# Patient Record
Sex: Female | Born: 1980 | Race: White | Hispanic: No | Marital: Single | Smoking: Never smoker
Health system: Southern US, Community
[De-identification: ages and names within clinical notes are randomized; demographics above are authoritative.]

## PROBLEM LIST (undated history)

## (undated) DIAGNOSIS — I10 Essential (primary) hypertension: Secondary | ICD-10-CM

## (undated) DIAGNOSIS — E669 Obesity, unspecified: Secondary | ICD-10-CM

## (undated) DIAGNOSIS — G4730 Sleep apnea, unspecified: Secondary | ICD-10-CM

## (undated) DIAGNOSIS — B009 Herpesviral infection, unspecified: Secondary | ICD-10-CM

## (undated) DIAGNOSIS — M1990 Unspecified osteoarthritis, unspecified site: Secondary | ICD-10-CM

## (undated) DIAGNOSIS — F419 Anxiety disorder, unspecified: Secondary | ICD-10-CM

## (undated) DIAGNOSIS — J45909 Unspecified asthma, uncomplicated: Secondary | ICD-10-CM

## (undated) DIAGNOSIS — T7840XA Allergy, unspecified, initial encounter: Secondary | ICD-10-CM

## (undated) DIAGNOSIS — R000 Tachycardia, unspecified: Secondary | ICD-10-CM

## (undated) DIAGNOSIS — K219 Gastro-esophageal reflux disease without esophagitis: Secondary | ICD-10-CM

## (undated) DIAGNOSIS — D689 Coagulation defect, unspecified: Secondary | ICD-10-CM

## (undated) DIAGNOSIS — C801 Malignant (primary) neoplasm, unspecified: Secondary | ICD-10-CM

## (undated) DIAGNOSIS — N83209 Unspecified ovarian cyst, unspecified side: Secondary | ICD-10-CM

## (undated) DIAGNOSIS — D649 Anemia, unspecified: Secondary | ICD-10-CM

## (undated) HISTORY — DX: Sleep apnea, unspecified: G47.30

## (undated) HISTORY — PX: SALPINGOOPHORECTOMY: SHX82

## (undated) HISTORY — DX: Essential (primary) hypertension: I10

## (undated) HISTORY — DX: Coagulation defect, unspecified: D68.9

## (undated) HISTORY — DX: Unspecified osteoarthritis, unspecified site: M19.90

## (undated) HISTORY — DX: Anxiety disorder, unspecified: F41.9

## (undated) HISTORY — DX: Gastro-esophageal reflux disease without esophagitis: K21.9

## (undated) HISTORY — DX: Obesity, unspecified: E66.9

## (undated) HISTORY — DX: Unspecified ovarian cyst, unspecified side: N83.209

## (undated) HISTORY — DX: Unspecified asthma, uncomplicated: J45.909

## (undated) HISTORY — DX: Allergy, unspecified, initial encounter: T78.40XA

## (undated) HISTORY — DX: Herpesviral infection, unspecified: B00.9

## (undated) HISTORY — DX: Tachycardia, unspecified: R00.0

## (undated) HISTORY — DX: Malignant (primary) neoplasm, unspecified: C80.1

## (undated) HISTORY — DX: Anemia, unspecified: D64.9

---

## 2003-05-01 DIAGNOSIS — C801 Malignant (primary) neoplasm, unspecified: Secondary | ICD-10-CM

## 2003-05-01 HISTORY — PX: OMENTECTOMY: SHX2098

## 2003-05-01 HISTORY — PX: OTHER SURGICAL HISTORY: SHX169

## 2003-05-01 HISTORY — DX: Malignant (primary) neoplasm, unspecified: C80.1

## 2008-09-29 ENCOUNTER — Emergency Department (HOSPITAL_BASED_OUTPATIENT_CLINIC_OR_DEPARTMENT_OTHER): Admission: EM | Admit: 2008-09-29 | Discharge: 2008-09-29 | Payer: Self-pay | Admitting: Emergency Medicine

## 2008-09-29 ENCOUNTER — Ambulatory Visit: Payer: Self-pay | Admitting: Diagnostic Radiology

## 2008-12-07 ENCOUNTER — Emergency Department (HOSPITAL_BASED_OUTPATIENT_CLINIC_OR_DEPARTMENT_OTHER): Admission: EM | Admit: 2008-12-07 | Discharge: 2008-12-07 | Payer: Self-pay | Admitting: Emergency Medicine

## 2008-12-26 ENCOUNTER — Ambulatory Visit: Payer: Self-pay | Admitting: Diagnostic Radiology

## 2008-12-26 ENCOUNTER — Emergency Department (HOSPITAL_BASED_OUTPATIENT_CLINIC_OR_DEPARTMENT_OTHER): Admission: EM | Admit: 2008-12-26 | Discharge: 2008-12-26 | Payer: Self-pay | Admitting: Emergency Medicine

## 2009-11-07 ENCOUNTER — Ambulatory Visit: Payer: Self-pay | Admitting: Family Medicine

## 2010-01-09 ENCOUNTER — Ambulatory Visit: Payer: Self-pay | Admitting: Family Medicine

## 2010-01-09 DIAGNOSIS — I10 Essential (primary) hypertension: Secondary | ICD-10-CM

## 2010-01-09 DIAGNOSIS — K644 Residual hemorrhoidal skin tags: Secondary | ICD-10-CM

## 2010-01-09 DIAGNOSIS — J309 Allergic rhinitis, unspecified: Secondary | ICD-10-CM

## 2010-01-09 DIAGNOSIS — N760 Acute vaginitis: Secondary | ICD-10-CM | POA: Insufficient documentation

## 2010-01-09 HISTORY — DX: Essential (primary) hypertension: I10

## 2010-01-09 HISTORY — DX: Residual hemorrhoidal skin tags: K64.4

## 2010-01-09 HISTORY — DX: Allergic rhinitis, unspecified: J30.9

## 2010-01-10 ENCOUNTER — Encounter: Payer: Self-pay | Admitting: Family Medicine

## 2010-01-10 LAB — CONVERTED CEMR LAB
Clue Cells Wet Prep HPF POC: NONE SEEN
Trich, Wet Prep: NONE SEEN
Yeast Wet Prep HPF POC: NONE SEEN

## 2010-02-08 ENCOUNTER — Ambulatory Visit: Payer: Self-pay | Admitting: Family Medicine

## 2010-02-09 LAB — CONVERTED CEMR LAB
AST: 20 units/L (ref 0–37)
Albumin: 4 g/dL (ref 3.5–5.2)
Alkaline Phosphatase: 111 units/L (ref 39–117)
BUN: 16 mg/dL (ref 6–23)
Glucose, Bld: 106 mg/dL — ABNORMAL HIGH (ref 70–99)
HDL: 48 mg/dL (ref >39)
LDL Cholesterol: 81 mg/dL (ref 0–99)
Potassium: 3.9 meq/L (ref 3.5–5.3)
Sodium: 137 meq/L (ref 135–145)
Total Bilirubin: 0.4 mg/dL (ref 0.3–1.2)
Triglycerides: 67 mg/dL (ref <150)
VLDL: 13 mg/dL (ref 0–40)

## 2010-02-10 ENCOUNTER — Encounter: Payer: Self-pay | Admitting: Family Medicine

## 2010-03-06 ENCOUNTER — Telehealth: Payer: Self-pay | Admitting: Family Medicine

## 2010-04-03 ENCOUNTER — Ambulatory Visit: Payer: Self-pay | Admitting: Family Medicine

## 2010-04-03 DIAGNOSIS — B86 Scabies: Secondary | ICD-10-CM | POA: Insufficient documentation

## 2010-04-03 DIAGNOSIS — J019 Acute sinusitis, unspecified: Secondary | ICD-10-CM | POA: Insufficient documentation

## 2010-04-03 DIAGNOSIS — B359 Dermatophytosis, unspecified: Secondary | ICD-10-CM | POA: Insufficient documentation

## 2010-04-07 ENCOUNTER — Telehealth: Payer: Self-pay | Admitting: Family Medicine

## 2010-04-17 ENCOUNTER — Telehealth: Payer: Self-pay | Admitting: Family Medicine

## 2010-05-30 NOTE — Progress Notes (Signed)
  Phone Note Call from Patient   Caller: Patient Call For: Nani Gasser MD Summary of Call: pt called and requested a 90 day supply of benazapril and carvediol sent to [facility] Initial call taken by: Avon Gully CMA, Duncan Dull),  March 06, 2010 11:57 AM    Prescriptions: BENAZEPRIL HCL 20 MG TABS (BENAZEPRIL HCL) Take 1 tablet by mouth once a day  #90 x 0   Entered by:   Avon Gully CMA, (AAMA)   Authorized by:   Nani Gasser MD   Signed by:   Avon Gully CMA, (AAMA) on 03/06/2010   Method used:   [facility]        [facility] [phone]* (retail)       [address] [place], [place]  [address]       Ph: [phone] or [phone]       Fax: [phone]   RxID:   [id number] CARVEDILOL 3.125 MG TABS (CARVEDILOL) bid  #90 x 0   Entered by:   Avon Gully CMA, (AAMA)   Authorized by:   Nani Gasser MD   Signed by:   Avon Gully CMA, (AAMA) on 03/06/2010   Method used:   [facility]        [facility] [address] (retail)       [address] [place], [place]  [address]       Ph: [phone] or [phone]       Fax: [phone]   RxID:   [id number]

## 2010-05-30 NOTE — Assessment & Plan Note (Signed)
Summary: EYE PAIN/CHEST PAIN/STOMACH PAIN   Vital Signs:  Patient Profile:   30 Years Old Female CC:      cold & URI symptoms X 1 week, body aches Height:     63 inches Weight:      253 pounds O2 Sat:      98 % O2 treatment:    Room Air Temp:     97.69 degrees F oral Pulse rate:   92 / minute Resp:     18 per minute BP sitting:   105 / 104  (right arm) Cuff size:   large  Pt. in pain?   yes    Location:   body aches  Vitals Entered By: Lajean Saver RN (November 07, 2009 4:02 PM)                   Updated Prior Medication List: ZYRTEC ALLERGY 10 MG TABS (CETIRIZINE HCL) once daily CARVEDILOL 3.125 MG TABS (CARVEDILOL) bid REOCYTE PLUS 106-1 MG CAPS (B COMPLEX-C-MIN-FE-FA) once daily * BENEZRPRIL 20MG  once daily  Current Allergies: No known allergies History of Present Illness Chief Complaint: cold & URI symptoms X 1 week, body aches History of Present Illness:  Subjective: Patient developed URI symptoms one week ago with sore throat and nasal congestion.  She complains of persistent sore throat and now has earache.  She also has a history of perennial rhinitis.  She also requests Rx for hemorrhoid cream; she has a history of hemorrhoidectomy and recently has had rectal irritation.  She states that she may get yeast infection with antibiotic + cough developed 2 days ago. No pleuritic pain No wheezing + post-nasal drainage ? sinus pain/pressure No itchy/red eyes No hemoptysis No SOB No fever, + chills No nausea No vomiting No abdominal pain No diarrhea No skin rashes +fatigue No myalgias No headache Used OTC meds without relief   REVIEW OF SYSTEMS Constitutional Symptoms       Complains of fatigue.     Denies fever, chills, night sweats, weight loss, and weight gain.  Eyes       Complains of eye pain.      Denies change in vision, eye discharge, glasses, contact lenses, and eye surgery. Ear/Nose/Throat/Mouth       Complains of ear pain, sore throat, and  hoarseness.      Denies hearing loss/aids, change in hearing, ear discharge, dizziness, frequent runny nose, frequent nose bleeds, sinus problems, and tooth pain or bleeding.  Respiratory       Complains of dry cough.      Denies productive cough, wheezing, shortness of breath, asthma, bronchitis, and emphysema/COPD.  Cardiovascular       Complains of chest pain.      Denies murmurs and tires easily with exhertion.      Comments: sore   Gastrointestinal       Complains of stomach pain.      Denies nausea/vomiting, diarrhea, constipation, blood in bowel movements, and indigestion.      Comments: sore, hemroids Genitourniary       Denies painful urination, kidney stones, and loss of urinary control. Neurological       Complains of headaches.      Denies paralysis, seizures, and fainting/blackouts. Musculoskeletal       Denies muscle pain, joint pain, joint stiffness, decreased range of motion, redness, swelling, muscle weakness, and gout.  Skin       Denies bruising, unusual mles/lumps or sores, and hair/skin or nail changes.  Psych       Denies mood changes, temper/anger issues, anxiety/stress, speech problems, depression, and sleep problems. Other Comments: symptoms X 1 week, patient has not taken BP medication today   Past History:  Past Medical History: ca 2005  Past Surgical History: 2005- 1ovary, bilater fallopian tubes removed  Family History: Family History Diabetes 1st degree relative Family History of Cardiovascular disorder  Social History: Occupation: Librarian, academic Single Never Smoked Alcohol use-yes, occasionally Drug use-no Smoking Status:  never Drug Use:  no   Objective:  No acute distress  Eyes:  Pupils are equal, round, and reactive to light and accomdation.  Extraocular movement is intact.  Conjunctivae are not inflamed.  Ears:  Canals normal.  Right tympanic membrane normal, left tympanic membrane red Nose:  Congested turbinates.  Maxillary sinus tenderness  present Pharynx:  Mildly erythematous Neck:  Supple.  Slightly tender shotty anterior/posterior nodes are palpated bilaterally.  Lungs:  Clear to auscultation.  Breath sounds are equal.  Heart:  Regular rate and rhythm without murmurs, rubs, or gallops.  Abdomen:  Nontender without masses or hepatosplenomegaly.  Bowel sounds are present.  No CVA or flank tenderness.  Assessment New Problems: OTITIS MEDIA, SUPPURATIVE, ACUTE, LEFT (ICD-382.00) URI (ICD-465.9) FAMILY HISTORY DIABETES 1ST DEGREE RELATIVE (ICD-V18.0)   Plan New Medications/Changes: TERAZOL 7 0.4 % CREA (TERCONAZOLE) 1 applicatorful PV at bedtime for one week  #1 tube x 0, 11/07/2009, Donna Christen MD ANUSOL-HC 25 MG SUPP (HYDROCORTISONE ACETATE) One pr three times a day prin  #12 x 1, 11/07/2009, Donna Christen MD BENZONATATE 200 MG CAPS (BENZONATATE) One by mouth hs as needed cough  #12 x 0, 11/07/2009, Donna Christen MD AMOXICILLIN 875 MG TABS (AMOXICILLIN) One by mouth two times a day  #20 x 0, 11/07/2009, Donna Christen MD  New Orders: New Patient Level III [04540] Rapid Strep [98119] Planning Comments:   Begin amoxicillin for 10 days.  Begin expectorant and topical decongestant.  Cough suppressant at bedtime.  Stop Zyrtec for now. Rx for Anusol suppositories.  Begin Terazol vaginal cream. Follow-up with PCP if not improving.   The patient and/or caregiver has been counseled thoroughly with regard to medications prescribed including dosage, schedule, interactions, rationale for use, and possible side effects and they verbalize understanding.  Diagnoses and expected course of recovery discussed and will return if not improved as expected or if the condition worsens. Patient and/or caregiver verbalized understanding.  Prescriptions: TERAZOL 7 0.4 % CREA (TERCONAZOLE) 1 applicatorful PV at bedtime for one week  #1 tube x 0   Entered and Authorized by:   Donna Christen MD   Signed by:   Donna Christen MD on 11/07/2009    Method used:   Print then Give to Patient   RxID:   [id number] ANUSOL-HC 25 MG SUPP (HYDROCORTISONE ACETATE) One pr three times a day prin  #12 x 1   Entered and Authorized by:   Donna Christen MD   Signed by:   Donna Christen MD on 11/07/2009   Method used:   Print then Give to Patient   RxID:   [id number] BENZONATATE 200 MG CAPS (BENZONATATE) One by mouth hs as needed cough  #12 x 0   Entered and Authorized by:   Donna Christen MD   Signed by:   Donna Christen MD on 11/07/2009   Method used:   Print then Give to Patient   RxID:   [id number] AMOXICILLIN 875 MG TABS (AMOXICILLIN) One by mouth two times a day  #20  x 0   Entered and Authorized by:   Donna Christen MD   Signed by:   Donna Christen MD on 11/07/2009   Method used:   Print then Give to Patient   RxID:   [id number]   Patient Instructions: 1)  May use Mucinex (guaifenesin) twice daily for congestion. 2)  Increase fluid intake, rest. 3)  May use Afrin nasal spray (or generic oxymetazoline) twice daily for about 5 days.  Also recommend using saline nasal spray several times daily and/or saline nasal irrigation. 4)  Followup with family doctor if not improving 7 to 10 days  Orders Added: 1)  New Patient Level III [65784] 2)  Rapid Strep [69629]  Appended Document: EYE PAIN/CHEST PAIN/STOMACH PAIN Rapid strep test negative

## 2010-05-30 NOTE — Letter (Signed)
Summary: Out of Work  [facility]  [address]   [place], [place] [address]   Phone: [phone]  Fax: [phone]    November 07, 2009   Employee:  Bellatrix Viloria    To Whom It May Concern:   For Medical reasons, please excuse the above named employee from work today.    If you need additional information, please feel free to contact our office.         Sincerely,    Donna Christen MD

## 2010-05-30 NOTE — Letter (Signed)
Summary: Generic Letter  [facility]  [address], [address]   [place], [place] [address]   Phone: [phone]  Fax: [phone]    02/10/2010  Julliana Cheaney [address] [place], [place]  [address]  Dear Ms. Henneke,  We have received your lab results back. Your cholesterol looks great!  Your sugar is elevated at 106 which is in the pre- diabetic range, work on decreasing your sugar and carbohydrate intake and get regular exercise and we will recheck a fasting sugar in 6 months.         Sincerely,   Nani Gasser, MD

## 2010-05-30 NOTE — Assessment & Plan Note (Signed)
Summary: Sinsuitis, scabies   Vital Signs:  Patient profile:   30 year old female Height:      63 inches Weight:      245 pounds Temp:     97.4 degrees F oral Pulse rate:   76 / minute BP sitting:   149 / 90  (right arm) Cuff size:   large  Vitals Entered By: Avon Gully CMA, Duncan Dull) (April 03, 2010 10:49 AM) CC: cough,congestion,HA,sore throat,coughing up mucus since Nov 18   CC:  cough, congestion, HA, sore throat, and coughing up mucus since Nov 18.  History of Present Illness: cough,congestion,HA,sore throat,coughing up mucus since Nov 18.  Sx for 3 weeks. Started Tyelnol coldnad sinus x 5 days. Then felt a little better but never got better. Started to get worse over the last week. Zyrtec still on it. No fever.  Chest hurts. Coughing, sneezing, HA, Ear pain.  Nasal congestion is dark green.  Bumps on her arms showed up around the same time. It is itchy.  Seems worse at night. Her dog sleeps in her bed so she was worried it may be something fromher dog. No creams or ointments.   Current Medications (verified): 1)  Zyrtec Allergy 10 Mg Tabs (Cetirizine Hcl) .... Once Daily 2)  Carvedilol 6.25 Mg Tabs (Carvedilol) .... Take 1 Tablet By Mouth Two Times A Day 3)  Benazepril Hcl 20 Mg Tabs (Benazepril Hcl) .... Take 1 Tablet By Mouth Once A Day  Allergies (verified): No Known Drug Allergies  Comments:  Nurse/Medical Assistant: The patient's medications and allergies were reviewed with the patient and were updated in the Medication and Allergy Lists. Avon Gully CMA, Duncan Dull) (April 03, 2010 10:50 AM)  Physical Exam  General:  Well-developed,well-nourished,in no acute distress; alert,appropriate and cooperative throughout examination Head:  Normocephalic and atraumatic without obvious abnormalities. No apparent alopecia or balding. Eyes:  No corneal or conjunctival inflammation noted. EOMI. Perrla. Ears:  External ear exam shows no significant lesions or  deformities.  Otoscopic examination reveals clear canals, tympanic membranes are intact bilaterally without bulging, retraction, inflammation or discharge. Hearing is grossly normal bilaterally. Nose:  External nasal examination shows no deformity or inflammation. Nasal mucosa are pink and moist without lesions or exudates. Mouth:  Oral mucosa and oropharynx without lesions or exudates.  Teeth in good repair. Neck:  No deformities, masses, or tenderness noted. Lungs:  Normal respiratory effort, chest expands symmetrically. Lungs are clear to auscultation, no crackles or wheezes. Heart:  Normal rate and regular rhythm. S1 and S2 normal without gallop, murmur, click, rub or other extra sounds. Msk:  She has small erythematous dry papules  few on her anterior wrist , right axillar, right foot and a few on the crease on her abdomen. Not linear pattern.  also on the lateral right foot has a circular erthematous lesion that has a raised border and some central clearing. Also some very dry callous skin on her toes.  Skin:  no rashes.   Cervical Nodes:  No lymphadenopathy noted Psych:  Cognition and judgment appear intact. Alert and cooperative with normal attention span and concentration. No apparent delusions, illusions, hallucinations   Impression & Recommendations:  Problem # 1:  SINUSITIS - ACUTE-NOS (ICD-461.9)  The following medications were removed from the medication list:    Benzonatate 200 Mg Caps (Benzonatate) ..... One by mouth hs as needed cough    Fluticasone Propionate 50 Mcg/act Susp (Fluticasone propionate) .Marland Kitchen... 2 sprays in each nostril once a day.  Her updated medication list for this problem includes:    Amoxicillin 875 Mg Tabs (Amoxicillin) .Marland Kitchen... Take 1 tablet by mouth two times a day for 10 days  Instructed on treatment. Call if symptoms persist or worsen. Contiue allergy tx in addition to abx. Also tx for yeast infection as she usually gets these with ABX tx.   Problem # 2:   SCABIES (ICD-133.0) Discussed possiblity of dx. This isn't from her dog. Explained transmitted from humans.Will treat. Avoid any sexual contact until treated.  Can repeat tx in 14 days.   Problem # 3:  RINGWORM (ICD-110.9) Reviewed tx.   Complete Medication List: 1)  Zyrtec Allergy 10 Mg Tabs (Cetirizine hcl) .... Once daily 2)  Carvedilol 6.25 Mg Tabs (Carvedilol) .... Take 1 tablet by mouth two times a day 3)  Benazepril Hcl 20 Mg Tabs (Benazepril hcl) .... Take 1 tablet by mouth once a day 4)  Permethrin 5 % Crea (Permethrin) .... Apply it to whole body (head to feet) and leave on overnight and then wash it off.  then can repeat in 14 days if needed. 5)  Amoxicillin 875 Mg Tabs (Amoxicillin) .... Take 1 tablet by mouth two times a day for 10 days 6)  Fluconazole 150 Mg Tabs (Fluconazole) .... Take 1 tablet by mouth once a day   Patient Instructions: 1)  Can use the lamisil on the ringworm on your feet two times a day for 6 weeks 2)  Can use the permtrin for scabies and repeat in 14 days if needed. ( if see new lesions).  3)  Complete the antibiotic for your sinusitis.  Prescriptions: FLUCONAZOLE 150 MG TABS (FLUCONAZOLE) Take 1 tablet by mouth once a day  #1 x 0   Entered and Authorized by:   Nani Gasser MD   Signed by:   Nani Gasser MD on 04/03/2010   Method used:   [facility]  [address] [phone][facility]       [address] [place], [place]  [address]       Ph: [phone] or [phone]       Fax: [phone]   RxID:   [id number] AMOXICILLIN 875 MG TABS (AMOXICILLIN) Take 1 tablet by mouth two times a day for 10 days  #20 x 0   Entered and Authorized by:   Nani Gasser MD   Signed by:   Nani Gasser MD on 04/03/2010   Method used:   [facility]        [facility]  [address] [phone]* (retail)       [address] [place], [place]  [address]       Ph: [phone] or [phone]       Fax:  [phone]   RxID:   [id number] PERMETHRIN 5 % CREA (PERMETHRIN) Apply it to whole body (head to feet) and leave on overnight and then wash it off.  Then can repeat in 14 days if needed.  #1 bottle x 0   Entered and Authorized by:   Nani Gasser MD   Signed by:   Nani Gasser MD on 04/03/2010   Method used:   [facility]        [facility]  [address] [phone]* (retail)       [address]  [place], [place]  [address]       Ph: [phone] or [phone]       Fax: [phone]   RxID:   [id number]    Orders Added: 1)  Est. Patient Level IV [74259]

## 2010-05-30 NOTE — Progress Notes (Signed)
Summary: scabies  Phone Note Call from Patient   Caller: Patient Call For: Nani Gasser MD Summary of Call: pt called and states she has more red lesions on her body from scabies.called and left message on pt vm mail to repeat med for scabies in 14 days and in the meantime we can call in triamcinalone cream to help with itching and redness. Initial call taken by: Avon Gully CMA, Duncan Dull),  April 07, 2010 9:12 AM  Follow-up for Phone Call        Rx Called In Follow-up by: Nani Gasser MD,  April 07, 2010 9:28 AM    New/Updated Medications: TRIAMCINOLONE ACETONIDE 0.5 % OINT (TRIAMCINOLONE ACETONIDE) Apply two times a day to affected areas. Prescriptions: TRIAMCINOLONE ACETONIDE 0.5 % OINT (TRIAMCINOLONE ACETONIDE) Apply two times a day to affected areas.  #20 grams. x 0   Entered and Authorized by:   Nani Gasser MD   Signed by:   Nani Gasser MD on 04/07/2010   Method used:   [facility]        [facility]  [address] [phone]* (retail)       [address]       [place], [place]  [address]       Ph: [phone] or [phone]       Fax: [phone]   RxID:   [id number]

## 2010-05-30 NOTE — Assessment & Plan Note (Signed)
Summary: NOV: HTN, vaginitis, hemorrhoids, etc   Vital Signs:  Patient profile:   30 year old female Height:      63 inches Weight:      249 pounds BMI:     44.27 Pulse rate:   80 / minute BP sitting:   143 / 104  (right arm) Cuff size:   large  Vitals Entered By: Avon Gully CMA, Duncan Dull) (January 09, 2010 2:03 PM) CC: NP est care High blood pressure, Hypertension Management   CC:  NP est care High blood pressure and Hypertension Management.  History of Present Illness: HTN- Dx a couple of years ago and treated with carvedolol. Has a cardiologist in [facility], but wants to find a family MD.   Does have sleep apnea, but is not treated. Can't use the machine.  Has excessive daytime fatigue. 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  Last sleep study 2 years ago. Can't watch an entire movie without falling asleep.  Tolerated BP meds well. OUt of the benazepril for a couple of weeks.   Was on allergy shots for 5 years. Stopped them about a year ago.  Has had 2 ear infection and one sinus infection in the last 3 months.  Has been on ABX adn has been getting yeast infections.  Thinks she has one right now.  Still having post nasal drip, ST, raspy voice and some ear pressure. No ear pain or fever. She is taking her zyrtec daily.    Hypertension History:      She denies headache, chest pain, palpitations, dyspnea with exertion, orthopnea, PND, peripheral edema, visual symptoms, neurologic problems, syncope, and side effects from treatment.  She notes no problems with any antihypertensive medication side effects.        Positive major cardiovascular risk factors include hypertension.  Negative major cardiovascular risk factors include female age less than 65 years old and non-tobacco-user status.     Habits & Providers  Alcohol-Tobacco-Diet     Alcohol drinks/day: <1     Tobacco Status: never  Exercise-Depression-Behavior     Does Patient Exercise: no     STD Risk: never     Drug Use: never   Seat Belt Use: always  Current Medications (verified): 1)  Zyrtec Allergy 10 Mg Tabs (Cetirizine Hcl) .... Once Daily 2)  Carvedilol 3.125 Mg Tabs (Carvedilol) .... Bid 3)  Reocyte Plus 106-1 Mg Caps (B Complex-C-Min-Fe-Fa) .... Once Daily 4)  Benezrpril 20mg  .... Once Daily 5)  Benzonatate 200 Mg Caps (Benzonatate) .... One By Mouth Hs As Needed Cough 6)  Anusol-Hc 25 Mg Supp (Hydrocortisone Acetate) .... One Pr Three Times A Day Prin 7)  Terazol 7 0.4 % Crea (Terconazole) .Marland Kitchen.. 1 Applicatorful Pv At Bedtime For One Week  Allergies (verified): No Known Drug Allergies  Comments:  Nurse/Medical Assistant: The patient's medications and allergies were reviewed with the patient and were updated in the Medication and Allergy Lists. Avon Gully CMA, Duncan Dull) (January 09, 2010 2:05 PM)  Past History:  Past Medical History: ca 2005 Laser tx for hemorrhoids  Family History: Family History Diabetes 1st degree relative Family History of Cardiovascular disorder Mom wtih HTN  Social History: Does Patient Exercise:  no STD Risk:  never Drug Use:  never Risk analyst Use:  always  Review of Systems       No fever/sweats/weakness, unexplained weight loss/gain.  No vison changes.  No difficulty hearing/ringing in ears, hay fever/allergies.  No chest pain/discomfort, palpitations.  No Br lump/nipple  discharge.  + cough/wheeze.  No blood in BM, nausea/vomiting/diarrhea.  No nighttime urination, leaking urine, unusual vaginal bleeding, discharge (penis or vagina).  No muscle/joint pain. No rash, change in mole.  No HA, memory loss.  No anxiety, sleep d/o, depression.  No easy bruising/bleeding, unexplained lump   Physical Exam  General:  Well-developed,well-nourished,in no acute distress; alert,appropriate and cooperative throughout examination Head:  Normocephalic and atraumatic without obvious abnormalities. No apparent alopecia or balding. Eyes:  No corneal or conjunctival inflammation  noted. EOMI. Perrla.  Ears:  External ear exam shows no significant lesions or deformities.  Otoscopic examination reveals clear canals, tympanic membranes are intact bilaterally without bulging, retraction, inflammation or discharge. Hearing is grossly normal bilaterally. Nose:  External nasal examination shows no deformity or inflammation.  Mouth:  Oral mucosa and oropharynx without lesions or exudates. Narrow OP with mildy enlarged tonsils.  Neck:  No deformities, masses, or tenderness noted. Lungs:  Normal respiratory effort, chest expands symmetrically. Lungs are clear to auscultation, no crackles or wheezes. Heart:  Normal rate and regular rhythm. S1 and S2 normal without gallop, murmur, click, rub or other extra sounds. Skin:  no rashes.   Cervical Nodes:  No lymphadenopathy noted Psych:  Cognition and judgment appear intact. Alert and cooperative with normal attention span and concentration. No apparent delusions, illusions, hallucinations   Impression & Recommendations:  Problem # 1:  HYPERTENSION, MILD (ICD-401.1) Assessment New She is out of her benazepril. Restart and then follow up  in one month to rehcekc pressure. Due for fasting labs as well.  Her updated medication list for this problem includes:    Carvedilol 3.125 Mg Tabs (Carvedilol) ..... Bid    Benazepril Hcl 20 Mg Tabs (Benazepril hcl) .Marland Kitchen... Take 1 tablet by mouth once a day  Orders: T-Comprehensive Metabolic Panel [id number]) T-Lipid Profile ([id number])  BP today: 143/104 Prior BP: 105/104 (11/07/2009)  Problem # 2:  ALLERGIC RHINITIS (ICD-477.9) Assessment: New Add nasal steroid to her regimen. If ST and ear pressure not getting better in one week then call the office or call sooner if gettting worse.  Refer to Allergist to get started on Allergy shots.  Her updated medication list for this problem includes:    Zyrtec Allergy 10 Mg Tabs (Cetirizine hcl) ..... Once daily    Fluticasone Propionate 50  Mcg/act Susp (Fluticasone propionate) .Marland Kitchen... 2 sprays in each nostril once a day.  Orders: Allergy Referral  (Allergy)  Problem # 3:  VAGINITIS (ICD-616.10) Assessment: New  Likely yeast with her recent ABX exposure. Will get wet prep to confirm but will go ahead and send over diflucan.  The following medications were removed from the medication list:    Amoxicillin 875 Mg Tabs (Amoxicillin) ..... One by mouth two times a day Her updated medication list for this problem includes:    Terazol 7 0.4 % Crea (Terconazole) .Marland Kitchen... 1 applicatorful pv at bedtime for one week  Orders: T-Wet Prep for Christoper Allegra, [facility] [phone])  Problem # 4:  EXTERNAL HEMORRHOIDS (ICD-455.3) Assessment: New Trial of anamantle. If not helping then consider laser tx as she had in the past as this did help for almost 2 years.  She had that done by her prior PCP at [facility].   Complete Medication List: 1)  Zyrtec Allergy 10 Mg Tabs (Cetirizine hcl) .... Once daily 2)  Carvedilol 3.125 Mg Tabs (Carvedilol) .... Bid 3)  Reocyte Plus 106-1 Mg Caps (B complex-c-min-fe-fa) .... Once daily 4)  Benazepril Hcl  20 Mg Tabs (Benazepril hcl) .... Take 1 tablet by mouth once a day 5)  Benzonatate 200 Mg Caps (Benzonatate) .... One by mouth hs as needed cough 6)  Anusol-hc 25 Mg Supp (Hydrocortisone acetate) .... One pr three times a day prin 7)  Terazol 7 0.4 % Crea (Terconazole) .Marland Kitchen.. 1 applicatorful pv at bedtime for one week 8)  Fluticasone Propionate 50 Mcg/act Susp (Fluticasone propionate) .... 2 sprays in each nostril once a day. 9)  Fluconazole 150 Mg Tabs (Fluconazole) .... Take 1 tablet by mouth once a day x 1 10)  Lidocaine-hydrocortisone Ace 3-2.5 % Kit (Lidocaine-hydrocortisone ace) .... Apply two times a day for 1 weeks.  Hypertension Assessment/Plan:      The patient's hypertensive risk group is category A: No risk factors and no target organ damage.  Today's blood pressure is 143/104.  Her  blood pressure goal is < 140/90.  Contraindications/Deferment of Procedures/Staging:    Test/Procedure: FLU VAX    Reason for deferment: patient declined   Patient Instructions: 1)  Add flonase to your allergy regimen.   2)  We will call you with the swab results. I did go ahead and send over a rx for diflucan for a yeast infection.  3)  Please schedule a follow-up appointment in 1 month.  Prescriptions: LIDOCAINE-HYDROCORTISONE ACE 3-2.5 % KIT (LIDOCAINE-HYDROCORTISONE ACE) Apply two times a day for 1 weeks.  #1 box x 1   Entered and Authorized by:   Nani Gasser MD   Signed by:   Nani Gasser MD on 01/09/2010   Method used:   Electronically to        [facility]  [address] [phone]* (retail)       [address] [place], [place]  [address]       Ph: [phone] or [phone]       Fax: [phone]   RxID:   [id number] FLUCONAZOLE 150 MG TABS (FLUCONAZOLE) Take 1 tablet by mouth once a day x 1  #1 x 1   Entered and Authorized by:   Nani Gasser MD   Signed by:   Nani Gasser MD on 01/09/2010   Method used:   Electronically to        [facility]  [address] [phone]* (retail)       [address] [place], [place]  [address]       Ph: [phone] or [phone]       Fax: [phone]   RxID:   [id number] FLUTICASONE PROPIONATE 50 MCG/ACT SUSP (FLUTICASONE PROPIONATE) 2 sprays in each nostril once a day.  #1 bottle. x 1   Entered and Authorized by:   Nani Gasser MD   Signed by:   Nani Gasser MD on 01/09/2010   Method used:   Electronically to        [facility]  [address] [phone]* (retail)       [address]       [place], [place]  [address]       Ph: [phone] or [phone]       Fax: [phone]   RxID:   [id number]   Appended Document: NOV: HTN, vaginitis, hemorrhoids, etc

## 2010-05-30 NOTE — Assessment & Plan Note (Signed)
Summary: 1 mo f/u HTN, allergies   Vital Signs:  Patient profile:   30 year old female Height:      63 inches Weight:      250 pounds BMI:     44.45 O2 Sat:      98 % on Room air Pulse rate:   90 / minute BP sitting:   100 / 62  (left arm) Cuff size:   large  Vitals Entered By: Payton Spark CMA (February 08, 2010 8:14 AM)  O2 Flow:  Room air CC: F/U. Doing well. , Hypertension Management   CC:  F/U. Doing well.  and Hypertension Management.  Hypertension History:      She denies headache, chest pain, palpitations, dyspnea with exertion, orthopnea, PND, peripheral edema, visual symptoms, neurologic problems, syncope, and side effects from treatment.  She notes no problems with any antihypertensive medication side effects.        Positive major cardiovascular risk factors include hypertension.  Negative major cardiovascular risk factors include female age less than 45 years old and non-tobacco-user status.     Current Medications (verified): 1)  Zyrtec Allergy 10 Mg Tabs (Cetirizine Hcl) .... Once Daily 2)  Carvedilol 3.125 Mg Tabs (Carvedilol) .... Bid 3)  Reocyte Plus 106-1 Mg Caps (B Complex-C-Min-Fe-Fa) .... Once Daily 4)  Benazepril Hcl 20 Mg Tabs (Benazepril Hcl) .... Take 1 Tablet By Mouth Once A Day 5)  Benzonatate 200 Mg Caps (Benzonatate) .... One By Mouth Hs As Needed Cough 6)  Anusol-Hc 25 Mg Supp (Hydrocortisone Acetate) .... One Pr Three Times A Day Prin 7)  Terazol 7 0.4 % Crea (Terconazole) .Marland Kitchen.. 1 Applicatorful Pv At Bedtime For One Week 8)  Fluticasone Propionate 50 Mcg/act Susp (Fluticasone Propionate) .... 2 Sprays in Each Nostril Once A Day. 9)  Fluconazole 150 Mg Tabs (Fluconazole) .... Take 1 Tablet By Mouth Once A Day X 1  Allergies (verified): No Known Drug Allergies  Physical Exam  General:  Well-developed,well-nourished,in no acute distress; alert,appropriate and cooperative throughout examination Head:  Normocephalic and atraumatic without obvious  abnormalities. No apparent alopecia or balding. Eyes:  No corneal or conjunctival inflammation noted. EOMI. Perrla. Ears:  External ear exam shows no significant lesions or deformities.  Otoscopic examination reveals clear canals, tympanic membranes are intact bilaterally without bulging, retraction, inflammation or discharge. Hearing is grossly normal bilaterally. Nose:  External nasal examination shows no deformity or inflammation. Nasal mucosa are pink and moist without lesions or exudates. Lungs:  Normal respiratory effort, chest expands symmetrically. Lungs are clear to auscultation, no crackles or wheezes. Heart:  Normal rate and regular rhythm. S1 and S2 normal without gallop, murmur, click, rub or other extra sounds. Skin:  no rashes.   Cervical Nodes:  No lymphadenopathy noted Psych:  Cognition and judgment appear intact. Alert and cooperative with normal attention span and concentration. No apparent delusions, illusions, hallucinations   Impression & Recommendations:  Problem # 1:  HYPERTENSION, MILD (ICD-401.1) BP looks great on teh meds. Due for labs. Reprinted and she can go this AM.   F/U in 6months.  Her updated medication list for this problem includes:    Carvedilol 3.125 Mg Tabs (Carvedilol) ..... Bid    Benazepril Hcl 20 Mg Tabs (Benazepril hcl) .Marland Kitchen... Take 1 tablet by mouth once a day  Problem # 2:  ALLERGIC RHINITIS (ICD-477.9) Much better controlled on the nasal steroid. She wants to hold off on the allergy referral.  Her updated medication list for this problem includes:  Zyrtec Allergy 10 Mg Tabs (Cetirizine hcl) ..... Once daily    Fluticasone Propionate 50 Mcg/act Susp (Fluticasone propionate) .Marland Kitchen... 2 sprays in each nostril once a day.  Problem # 3:  EXTERNAL HEMORRHOIDS (ICD-455.3) Says the anamantle was too expensive. Will change to cheaper option  Complete Medication List: 1)  Zyrtec Allergy 10 Mg Tabs (Cetirizine hcl) .... Once daily 2)  Carvedilol 3.125  Mg Tabs (Carvedilol) .... Bid 3)  Reocyte Plus 106-1 Mg Caps (B complex-c-min-fe-fa) .... Once daily 4)  Benazepril Hcl 20 Mg Tabs (Benazepril hcl) .... Take 1 tablet by mouth once a day 5)  Benzonatate 200 Mg Caps (Benzonatate) .... One by mouth hs as needed cough 6)  Terazol 7 0.4 % Crea (Terconazole) .Marland Kitchen.. 1 applicatorful pv at bedtime for one week 7)  Fluticasone Propionate 50 Mcg/act Susp (Fluticasone propionate) .... 2 sprays in each nostril once a day. 8)  Fluconazole 150 Mg Tabs (Fluconazole) .... Take 1 tablet by mouth once a day x 1 9)  Lidocaine-hydrocortisone Ace 3-0.5 % Crea (Lidocaine-hydrocortisone ace) .... Apply to rectal area two times a day  Other Orders: Tdap => 15yrs IM (95621) Admin 1st Vaccine (30865)  Hypertension Assessment/Plan:      The patient's hypertensive risk group is category A: No risk factors and no target organ damage.  Today's blood pressure is 100/62.  Her blood pressure goal is < 140/90.  Contraindications/Deferment of Procedures/Staging:    Test/Procedure: FLU VAX    Reason for deferment: patient declined   Patient Instructions: 1)  We will call you with your lab results.  Prescriptions: LIDOCAINE-HYDROCORTISONE ACE 3-0.5 % CREA (LIDOCAINE-HYDROCORTISONE ACE) Apply to rectal area two times a day  #1 tube x 1   Entered and Authorized by:   Nani Gasser MD   Signed by:   Nani Gasser MD on 02/08/2010   Method used:   [facility] to        [facility]  [address] [phone][facility] (retail)       [address] [place], [place]  [address]       Ph: [phone] or [phone]       Fax: [phone]   RxID:   [id number]     Immunizations Administered:  Tetanus Vaccine:    Vaccine Type: Tdap    Site: left deltoid    Mfr: GlaxoSmithKline    Dose: 0.5 ml    Route: IM    Given by: Sue Lush McCrimmon CMA, (AAMA)    Exp. Date: 02/17/2012    Lot #: [id number]    VIS given: 03/17/08 version given February 08, 2010.

## 2010-06-01 NOTE — Progress Notes (Signed)
Summary: Scabies no better  Phone Note Call from Patient Call back at [phone]   Caller: Patient Call For: Yolanda Gasser MD Summary of Call: Pt calls and states scabies still present and is spreading and is using the cream as directed. Alot of itching. ? what to do. Has no insurance right now. Initial call taken by: Kathlene November LPN,  April 17, 2010 3:14 PM  Follow-up for Phone Call        Did the steroid cream help? It may not be scabies if not getting better. I recommen refer to Derm for further evaluation.   Follow-up by: Yolanda Gasser MD,  April 17, 2010 3:17 PM  Additional Follow-up for Phone Call Additional follow up Details #1::        left message on pt's vm Additional Follow-up by: Avon Gully CMA, Duncan Dull),  April 18, 2010 8:49 AM

## 2010-07-04 ENCOUNTER — Encounter: Payer: Self-pay | Admitting: Family Medicine

## 2010-08-05 LAB — BASIC METABOLIC PANEL WITH GFR
BUN: 16 mg/dL (ref 6–23)
CO2: 30 meq/L (ref 19–32)
Calcium: 9.7 mg/dL (ref 8.4–10.5)
Chloride: 105 meq/L (ref 96–112)
Creatinine, Ser: 0.7 mg/dL (ref 0.4–1.2)
GFR calc Af Amer: >60 mL/min (ref >60)
GFR calc non Af Amer: >60 mL/min (ref >60)
Glucose, Bld: 89 mg/dL (ref 70–99)
Potassium: 4.1 meq/L (ref 3.5–5.1)
Sodium: 141 meq/L (ref 135–145)

## 2010-08-05 LAB — CBC
HCT: 35.9 % — ABNORMAL LOW (ref 36.0–46.0)
Hemoglobin: 12.4 g/dL (ref 12.0–15.0)
MCHC: 34.4 g/dL (ref 30.0–36.0)
MCV: 86.4 fL (ref 78.0–100.0)
Platelets: 188 K/uL (ref 150–400)
RBC: 4.15 MIL/uL (ref 3.87–5.11)
RDW: 12.6 % (ref 11.5–15.5)
WBC: 7.1 K/uL (ref 4.0–10.5)

## 2010-08-05 LAB — DIFFERENTIAL
Basophils Absolute: 0 K/uL (ref 0.0–0.1)
Basophils Relative: 0 % (ref 0–1)
Eosinophils Absolute: 0.4 K/uL (ref 0.0–0.7)
Eosinophils Relative: 6 % — ABNORMAL HIGH (ref 0–5)
Lymphocytes Relative: 27 % (ref 12–46)
Lymphs Abs: 1.9 K/uL (ref 0.7–4.0)
Monocytes Absolute: 0.4 K/uL (ref 0.1–1.0)
Monocytes Relative: 6 % (ref 3–12)
Neutro Abs: 4.4 K/uL (ref 1.7–7.7)
Neutrophils Relative %: 61 % (ref 43–77)

## 2010-08-07 LAB — BASIC METABOLIC PANEL
BUN: 19 mg/dL (ref 6–23)
Chloride: 105 mEq/L (ref 96–112)
Glucose, Bld: 95 mg/dL (ref 70–99)
Potassium: 3.9 mEq/L (ref 3.5–5.1)
Sodium: 141 mEq/L (ref 135–145)

## 2010-08-07 LAB — DIFFERENTIAL
Eosinophils Absolute: 0.2 10*3/uL (ref 0.0–0.7)
Eosinophils Relative: 2 % (ref 0–5)
Lymphs Abs: 1.5 10*3/uL (ref 0.7–4.0)
Monocytes Absolute: 0.5 10*3/uL (ref 0.1–1.0)

## 2010-08-07 LAB — POCT CARDIAC MARKERS: Troponin i, poc: <0.05 ng/mL (ref 0.00–0.09)

## 2010-08-07 LAB — CBC
HCT: 35.8 % — ABNORMAL LOW (ref 36.0–46.0)
Hemoglobin: 12.4 g/dL (ref 12.0–15.0)
MCV: 86.1 fL (ref 78.0–100.0)
Platelets: 155 10*3/uL (ref 150–400)
WBC: 7.4 10*3/uL (ref 4.0–10.5)

## 2010-08-10 ENCOUNTER — Ambulatory Visit: Payer: Self-pay | Admitting: Family Medicine

## 2010-10-30 ENCOUNTER — Other Ambulatory Visit: Payer: Self-pay | Admitting: Family Medicine

## 2010-12-10 ENCOUNTER — Other Ambulatory Visit: Payer: Self-pay | Admitting: Family Medicine

## 2011-01-08 ENCOUNTER — Other Ambulatory Visit: Payer: Self-pay | Admitting: Family Medicine

## 2011-01-08 ENCOUNTER — Telehealth: Payer: Self-pay | Admitting: Family Medicine

## 2011-01-08 ENCOUNTER — Encounter: Payer: Self-pay | Admitting: Family Medicine

## 2011-01-08 ENCOUNTER — Ambulatory Visit (INDEPENDENT_AMBULATORY_CARE_PROVIDER_SITE_OTHER): Payer: BC Managed Care – PPO | Admitting: Family Medicine

## 2011-01-08 VITALS — BP 169/96 | HR 108 | Wt 248.0 lb

## 2011-01-08 DIAGNOSIS — R41840 Attention and concentration deficit: Secondary | ICD-10-CM

## 2011-01-08 DIAGNOSIS — Z9989 Dependence on other enabling machines and devices: Secondary | ICD-10-CM

## 2011-01-08 DIAGNOSIS — R0789 Other chest pain: Secondary | ICD-10-CM

## 2011-01-08 DIAGNOSIS — G4733 Obstructive sleep apnea (adult) (pediatric): Secondary | ICD-10-CM

## 2011-01-08 DIAGNOSIS — I10 Essential (primary) hypertension: Secondary | ICD-10-CM

## 2011-01-08 HISTORY — DX: Obstructive sleep apnea (adult) (pediatric): G47.33

## 2011-01-08 MED ORDER — BENAZEPRIL-HYDROCHLOROTHIAZIDE 20-25 MG PO TABS: 1.0000 | ORAL_TABLET | Freq: Every day | ORAL | Status: DC

## 2011-01-08 MED ORDER — CARVEDILOL 12.5 MG PO TABS: 12.5000 mg | ORAL_TABLET | Freq: Two times a day (BID) | ORAL | Status: DC

## 2011-01-08 NOTE — Telephone Encounter (Signed)
Call pt: Let her know I didn get a copy of her sleep study so if needs order for new mask and tubing please let me know.

## 2011-01-08 NOTE — Progress Notes (Signed)
Subjective:    Patient ID: Yolanda Mosley, female    DOB: 1980/12/01, 30 y.o.   MRN: [id number]  Hypertension This is a chronic problem. The current episode started more than 1 year ago. The problem has been gradually worsening since onset. Associated symptoms include chest pain. Pertinent negatives include no shortness of breath. There are no associated agents to hypertension. Risk factors for coronary artery disease include no known risk factors. Past treatments include ACE inhibitors and calcium channel blockers. There are no compliance problems.    She has been waking up with AM HA.  She has not been wearing of her CPAP for months. Says was living with he rmom who smoked and the equipement smells like smoke so hasn't been using it. Says her mask and tubing is about 29 years old. Today she feels like she is having some chest tightness and a little dizzy. Says she feels this way sometimes when her BP is elevated. No diaphoresis.  Her BP is high today. Says was unable to give blood recently, was told anemic.  No worsening or alleviating sxs.    Having a lot of difficulty with attention and focus. Denies feeling anxious. Occ feels sad. Can't sit still to watch a movie. She is always off task at work and doesn't complete tasks.  She feels it is getting worse. She would like testing for ADD or ADHD. She has really been thinking about this for the last year.    Review of Systems  Respiratory: Negative for shortness of breath.   Cardiovascular: Positive for chest pain.       BP 169/96  Pulse 108  Wt 248 lb (112.492 kg)    No Known Allergies  Past Medical History  Diagnosis Date  . CA - cancer 2005  . Hemorrhoids     laser tx     Past Surgical History  Procedure Date  . Ovary, bilater fallopian tubes removed 2005    History   Social History  . Marital Status: Single    Spouse Name: N/A    Number of Children: N/A  . Years of Education: N/A   Occupational History  . Not on file.    Social History Main Topics  . Smoking status: Never Smoker   . Smokeless tobacco: Not on file  . Alcohol Use: Yes     occasionally  . Drug Use: No  . Sexually Active:    Other Topics Concern  . Not on file   Social History Narrative  . No narrative on file    Family History  Problem Relation Age of Onset  . Hypertension Mother   . Diabetes Other   . Other Other     cardiovascular disorder    Ms. Leys had no medications administered during this visit.  Objective:   Physical Exam  Constitutional: She is oriented to person, place, and time. She appears well-developed and well-nourished.  HENT:  Head: Normocephalic and atraumatic.       TMs are clear bilat. Canal are mildly erytthematous from over cleaning.   Cardiovascular: Normal rate, regular rhythm and normal heart sounds.   Pulmonary/Chest: Effort normal and breath sounds normal.  Neurological: She is alert and oriented to person, place, and time.  Skin: Skin is warm and dry.  Psychiatric: She has a normal mood and affect. Her behavior is normal.          Assessment & Plan:  HTN - Not well controlled. Weight is stable. She says she  already avoids salt.   Increase the carvedilol and add hctz and recheck in 4-6 weeks. Make sure eating healhty and working on regular exercise.   Declined flu vaccine today.   Depression - PHQ- 9 score of 9. I explained I do think she has mild depression and this might even ben making her inattentiveness worse. Asked her to think about treating this.    OSA - Discussed the importance of wearing her CPAP consistantly.  I discussed how this can improve her BP control and her weight and her mood.  She says she will try to clean her mask and tubing. I discussed that she should be able to get new tubing but I need to get a copy of her sleep study. If we can't find it can consider ordering new test. She thinks it was done about 4-5 years ago. Done at [facility].    Chest tightness. EKG  shos rate of 78 bpm, NSR. Inverted T waves in Lead 3.  Lead V4 not traced.  O/W no acute changes.  Will get labs to rule out anemia, thyroid d/o, etc.

## 2011-01-09 ENCOUNTER — Telehealth: Payer: Self-pay | Admitting: Family Medicine

## 2011-01-09 LAB — CBC WITH DIFFERENTIAL/PLATELET
Basophils Relative: 0 % (ref 0–1)
Eosinophils Absolute: 0.4 10*3/uL (ref 0.0–0.7)
Eosinophils Relative: 5 % (ref 0–5)
Lymphs Abs: 2.3 10*3/uL (ref 0.7–4.0)
MCH: 25.4 pg — ABNORMAL LOW (ref 26.0–34.0)
MCHC: 30.6 g/dL (ref 30.0–36.0)
MCV: 83.1 fL (ref 78.0–100.0)
Neutrophils Relative %: 56 % (ref 43–77)
Platelets: 236 10*3/uL (ref 150–400)
RDW: 14.7 % (ref 11.5–15.5)

## 2011-01-09 LAB — COMPLETE METABOLIC PANEL WITH GFR
ALT: 22 U/L (ref 0–35)
Albumin: 4.1 g/dL (ref 3.5–5.2)
CO2: 26 mEq/L (ref 19–32)
Calcium: 9.5 mg/dL (ref 8.4–10.5)
Chloride: 105 mEq/L (ref 96–112)
GFR, Est African American: >60 mL/min (ref >60)
GFR, Est Non African American: >60 mL/min (ref >60)
Glucose, Bld: 102 mg/dL — ABNORMAL HIGH (ref 70–99)
Potassium: 4.2 mEq/L (ref 3.5–5.3)
Sodium: 140 mEq/L (ref 135–145)
Total Bilirubin: 0.4 mg/dL (ref 0.3–1.2)
Total Protein: 7 g/dL (ref 6.0–8.3)

## 2011-01-09 LAB — IRON AND TIBC
%SAT: 15 % — ABNORMAL LOW (ref 20–55)
Iron: 55 ug/dL (ref 42–145)

## 2011-01-09 LAB — CK TOTAL AND CKMB (NOT AT ARMC): Total CK: 55 U/L (ref 7–177)

## 2011-01-09 NOTE — Telephone Encounter (Signed)
Pt states she found her tubing and mask but will call us back if she needs the sleep study

## 2011-01-09 NOTE — Telephone Encounter (Signed)
Pt notified and labs added.

## 2011-01-09 NOTE — Telephone Encounter (Signed)
Left message for pt to call back  °

## 2011-01-09 NOTE — Telephone Encounter (Signed)
Call pt: heart enzymes are nl.  CMP is normal. CBC is OK. hgb is 12. Call labs and see if can add an iron level.

## 2011-01-10 ENCOUNTER — Telehealth: Payer: Self-pay | Admitting: Family Medicine

## 2011-01-10 NOTE — Telephone Encounter (Signed)
Pt informed of lab results with instructions. Jarvis Newcomer, LPN Domingo Dimes

## 2011-01-10 NOTE — Telephone Encounter (Signed)
Call patient: The iron is in the normal range. It is on the lower and of normal center recommend she try to eat iron rich foods. We don't need to recheck this.

## 2011-01-16 ENCOUNTER — Telehealth: Payer: Self-pay | Admitting: Family Medicine

## 2011-01-16 DIAGNOSIS — G4730 Sleep apnea, unspecified: Secondary | ICD-10-CM

## 2011-01-16 MED ORDER — ALBUTEROL SULFATE HFA 108 (90 BASE) MCG/ACT IN AERS: 2.0000 | INHALATION_SPRAY | Freq: Four times a day (QID) | RESPIRATORY_TRACT | Status: DC | PRN

## 2011-01-16 NOTE — Telephone Encounter (Signed)
Pt called and said she was seen recently and was told to call back if she was having problems with her CPAP machine for OSA.  She got the tubing and mask and it cleaned up without problems and doesn't need that, but the machine every two hours during the night is dripping water into pts face while she is sleeping.  Had machine for 6 years from [facility].  Pt does not remember where she had the sleep study and will not be able to get [place] the report.  Pt was also suppose to get script from the provider at the office visit for Ventolin MDI but didn't get script.  Not on old or new system because pt orig script came from her allergist that she no longer see's. Plan:  Routed to Dr. Linford Arnold. Told pt to call [facility] to let them go out and check her machine.  Pt does not need tubing or mask. Do you want to order sleep study on pt? Need verbal order for ventolin inhaler so I can send to pt pharm.

## 2011-01-16 NOTE — Telephone Encounter (Addendum)
Ventolin MFI inhaler refilled and sent electronicall for #1 MDI/2 refills.  Pt informed. Will forward message to [facility] in referrals to do referral home health for the sleep study.  Dx:  OSA. Jarvis Newcomer, LPN Domingo Dimes

## 2011-01-16 NOTE — Telephone Encounter (Signed)
OK for ventolin rx.  Also we can schedule her for a home sleep study through home health.

## 2011-01-17 NOTE — Telephone Encounter (Signed)
Faxed referral and ov notes to FAX [phone] [facility] .Marland Kitchen... Thanks

## 2011-01-17 NOTE — Telephone Encounter (Signed)
Acknowledged. Ariannah Arenson, LPN /Triage  

## 2011-01-23 ENCOUNTER — Telehealth: Payer: Self-pay | Admitting: *Deleted

## 2011-01-23 ENCOUNTER — Telehealth: Payer: Self-pay | Admitting: Family Medicine

## 2011-01-23 NOTE — Telephone Encounter (Signed)
Pt called and states her bp is around 108/72 but ever since she started new BP meds she has felt dizzy and lightheaded.Please advise

## 2011-01-23 NOTE — Telephone Encounter (Signed)
Patient needs a home Sleep Study so once we get results back from [facility]we need to forward those results to [facility] Jonny Ruiz flynt w/ [facility] [phone] ) and he will set the patient up with a C-pap Auto tit[employer]. John with [facility] has already spoke with the patient and informed her of all this. I wanted to make sure Dr. Linford Arnold was aware that we are waiting to get her Home Sleep Study results back so we can get her a Cpap. Thanks, [employer]

## 2011-01-23 NOTE — Telephone Encounter (Signed)
Hold the carvedilol nad see how she feels.

## 2011-01-24 NOTE — Telephone Encounter (Signed)
Pt.notified

## 2011-01-26 ENCOUNTER — Encounter: Payer: Self-pay | Admitting: Family Medicine

## 2011-01-30 ENCOUNTER — Ambulatory Visit (INDEPENDENT_AMBULATORY_CARE_PROVIDER_SITE_OTHER): Payer: BC Managed Care – PPO | Admitting: Psychology

## 2011-01-30 DIAGNOSIS — F909 Attention-deficit hyperactivity disorder, unspecified type: Secondary | ICD-10-CM

## 2011-02-01 ENCOUNTER — Ambulatory Visit: Payer: BC Managed Care – PPO | Admitting: Family Medicine

## 2011-02-26 ENCOUNTER — Ambulatory Visit (INDEPENDENT_AMBULATORY_CARE_PROVIDER_SITE_OTHER): Payer: BC Managed Care – PPO | Admitting: Psychology

## 2011-02-26 DIAGNOSIS — F909 Attention-deficit hyperactivity disorder, unspecified type: Secondary | ICD-10-CM

## 2011-03-01 ENCOUNTER — Ambulatory Visit (INDEPENDENT_AMBULATORY_CARE_PROVIDER_SITE_OTHER): Payer: BC Managed Care – PPO | Admitting: Family Medicine

## 2011-03-01 ENCOUNTER — Encounter: Payer: Self-pay | Admitting: Family Medicine

## 2011-03-01 VITALS — BP 142/79 | HR 110 | Wt 240.0 lb

## 2011-03-01 DIAGNOSIS — R0789 Other chest pain: Secondary | ICD-10-CM

## 2011-03-01 DIAGNOSIS — I10 Essential (primary) hypertension: Secondary | ICD-10-CM

## 2011-03-01 DIAGNOSIS — F909 Attention-deficit hyperactivity disorder, unspecified type: Secondary | ICD-10-CM | POA: Insufficient documentation

## 2011-03-01 DIAGNOSIS — F988 Other specified behavioral and emotional disorders with onset usually occurring in childhood and adolescence: Secondary | ICD-10-CM

## 2011-03-01 HISTORY — DX: Attention-deficit hyperactivity disorder, unspecified type: F90.9

## 2011-03-01 LAB — CBC WITH DIFFERENTIAL/PLATELET
Basophils Relative: 0 % (ref 0–1)
Eosinophils Absolute: 0.2 10*3/uL (ref 0.0–0.7)
Eosinophils Relative: 3 % (ref 0–5)
Hemoglobin: 13.1 g/dL (ref 12.0–15.0)
Lymphs Abs: 2.4 10*3/uL (ref 0.7–4.0)
MCH: 26.4 pg (ref 26.0–34.0)
MCHC: 33 g/dL (ref 30.0–36.0)
MCV: 80 fL (ref 78.0–100.0)
Monocytes Absolute: 0.4 10*3/uL (ref 0.1–1.0)
Monocytes Relative: 6 % (ref 3–12)
RBC: 4.96 MIL/uL (ref 3.87–5.11)

## 2011-03-01 LAB — COMPLETE METABOLIC PANEL WITH GFR
ALT: 23 U/L (ref 0–35)
AST: 24 U/L (ref 0–37)
CO2: 28 mEq/L (ref 19–32)
Chloride: 98 mEq/L (ref 96–112)
Creat: 0.77 mg/dL (ref 0.50–1.10)
Sodium: 137 mEq/L (ref 135–145)
Total Bilirubin: 0.4 mg/dL (ref 0.3–1.2)
Total Protein: 7.6 g/dL (ref 6.0–8.3)

## 2011-03-01 LAB — TROPONIN I: Troponin I: <0.01 ng/mL (ref <0.06)

## 2011-03-01 LAB — TSH: TSH: 1.967 u[IU]/mL (ref 0.350–4.500)

## 2011-03-01 MED ORDER — LISDEXAMFETAMINE DIMESYLATE 30 MG PO CAPS: 30.0000 mg | ORAL_CAPSULE | ORAL | Status: DC

## 2011-03-01 NOTE — Progress Notes (Signed)
Subjective:    Patient ID: Yolanda Mosley, female    DOB: Jan 21, 1981, 30 y.o.   MRN: [id number]  HPI Started getting chest pain last night when trick or treating.  Pain in med chest to the right. Tried Pepcid AC nad not improvement. Then took TUMS and no relief. Woke up with the chest pain.  Pin in constantly. No cough or wheeze.  Left ear and throat pain for 2 days.  Runny nose.  Taking zyrtec. Didn't try her inhaler.  Worse when turns her head.  Didn't try an NSAID.   She is also here to followup on her ADD. She was evaluated by Dr. Wallene Huh and he felt that she did have ADD. He recommended that she followup here for medication. She has a followup with him in one month to reevaluate her. He also must perform some IQ testing.  Peak flow is 460.   Review of Systems    BP 142/79  Pulse 110  Wt 240 lb (108.863 kg)    No Known Allergies  Past Medical History  Diagnosis Date  . CA - cancer 2005  . Hemorrhoids     laser tx     Past Surgical History  Procedure Date  . Ovary, bilater fallopian tubes removed 2005    History   Social History  . Marital Status: Single    Spouse Name: N/A    Number of Children: N/A  . Years of Education: N/A   Occupational History  . Not on file.   Social History Main Topics  . Smoking status: Never Smoker   . Smokeless tobacco: Not on file  . Alcohol Use: Yes     occasionally  . Drug Use: No  . Sexually Active:    Other Topics Concern  . Not on file   Social History Narrative  . No narrative on file    Family History  Problem Relation Age of Onset  . Hypertension Mother   . Diabetes Other   . Other Other     cardiovascular disorder    Ms. Mccaskey does not currently have medications on file. . Objective:   Physical Exam  Constitutional: She is oriented to person, place, and time. She appears well-developed and well-nourished.  HENT:  Head: Normocephalic and atraumatic.  Right Ear: External ear normal.  Left Ear: External ear  normal.  Nose: Nose normal.  Mouth/Throat: Oropharynx is clear and moist.       TMs and canals are clear.   Eyes: Conjunctivae and EOM are normal. Pupils are equal, round, and reactive to light.  Neck: Neck supple. No thyromegaly present.  Cardiovascular: Normal rate, regular rhythm and normal heart sounds.   Pulmonary/Chest: Effort normal and breath sounds normal. She has no wheezes.  Lymphadenopathy:    She has no cervical adenopathy.  Neurological: She is alert and oriented to person, place, and time.  Skin: Skin is warm and dry.  Psychiatric: She has a normal mood and affect. Her behavior is normal.          Assessment & Plan:  Atypical chest Pain.  - Her peak flows are normal so likely not her asthma. She tried 2 different antacid products in neither helped, so less likely to be reflux. EKG shows NSR, rate of 88 bpm, no acute changes.  This is reassuring.  Will check labs today and will check d-dimer to rule out PE.  Her blood pressure is slightly elevated today. We did discuss working on low salt diet  and making sure she is taking her blood pressure medication regularly. She says she has been skipping the carvedilol because she feels a little dizzy when she takes it at the same time as she takes the benazepril. She can try Aleve or ibuprofen to see if this helps, as it could be more musculoskeletal pain.  Hypertension-not well controlled today. She says she has been skipping the carvedilol because she feels a little dizzy when she takes it at the same time as she takes the benazepril. I asked her to try taking benazepril in the morning and adjust the carvedilol a single dose at bedtime. We'll see if this better controls her blood pressure but without making her dizzy. If it persists then we'll need to change the carvedilol.   ADD-new diagnosis. We discussed the treatments used for this. I would like to start with Vyvanse.  We will start with a 30 mg dose. I asked her to hold off on  starting this medication until we get the labs back about her chest pain and her chest pain completely resolves. Followup in one month.

## 2011-03-01 NOTE — Patient Instructions (Addendum)
We will call you with the results.   Try Aleve and Ibuprofen and see if helps the chest pain.  Hold  vyvanse until get the labs back.

## 2011-03-02 ENCOUNTER — Ambulatory Visit
Admission: RE | Admit: 2011-03-02 | Discharge: 2011-03-02 | Disposition: A | Discharge status: Home / Self Care | Payer: BC Managed Care – PPO | Source: Ambulatory Visit | Attending: Family Medicine | Admitting: Family Medicine

## 2011-03-02 ENCOUNTER — Telehealth: Payer: Self-pay | Admitting: *Deleted

## 2011-03-02 ENCOUNTER — Encounter: Payer: Self-pay | Admitting: Family Medicine

## 2011-03-02 ENCOUNTER — Other Ambulatory Visit: Payer: Self-pay | Admitting: Family Medicine

## 2011-03-02 DIAGNOSIS — R7989 Other specified abnormal findings of blood chemistry: Secondary | ICD-10-CM

## 2011-03-02 MED ORDER — IOHEXOL 300 MG/ML  SOLN
150.0000 mL | Freq: Once | INTRAMUSCULAR | Status: AC | PRN
  Administered 2011-03-02: 150 mL via INTRAVENOUS

## 2011-03-02 NOTE — Telephone Encounter (Signed)
Pt notified and notified her about elevated d-dimer. Pt wanted you to know that her chest is no longer hurting

## 2011-03-02 NOTE — Telephone Encounter (Signed)
Called and left message at the first number and last number that was listed and called pt's job but she was not there

## 2011-03-02 NOTE — Telephone Encounter (Signed)
I really still want to do the CT.

## 2011-03-02 NOTE — Telephone Encounter (Signed)
Message copied by Wyline Beady on Fri Mar 02, 2011 10:43 AM ------      Message from: Nani Gasser D      Created: Thu Mar 01, 2011  8:54 PM       Thyroid is nl. CMP is nl.  Calcium was slighlly elevated. Stop any extra calcium supplements and recheck in 1 mo. CBC is nl. Heart enzymes are nl.

## 2011-03-16 ENCOUNTER — Other Ambulatory Visit: Payer: Self-pay | Admitting: Family Medicine

## 2011-03-20 ENCOUNTER — Ambulatory Visit (INDEPENDENT_AMBULATORY_CARE_PROVIDER_SITE_OTHER): Payer: BC Managed Care – PPO | Admitting: Psychology

## 2011-03-20 DIAGNOSIS — F909 Attention-deficit hyperactivity disorder, unspecified type: Secondary | ICD-10-CM

## 2011-03-21 ENCOUNTER — Telehealth: Payer: Self-pay | Admitting: *Deleted

## 2011-03-21 NOTE — Telephone Encounter (Signed)
Pt called and states her psych said that her vyvanse needs to be increased.Pt is not due for her meds yet. Called and left message that we can increase it at the next refill

## 2011-04-02 ENCOUNTER — Ambulatory Visit: Payer: BC Managed Care – PPO | Admitting: Family Medicine

## 2011-04-02 ENCOUNTER — Telehealth: Payer: Self-pay | Admitting: *Deleted

## 2011-04-02 MED ORDER — LISDEXAMFETAMINE DIMESYLATE 40 MG PO CAPS: 40.0000 mg | ORAL_CAPSULE | ORAL | Status: DC

## 2011-04-02 NOTE — Telephone Encounter (Signed)
There maybe coupon online. She can look and we can check the sample closet. New rx printed.

## 2011-04-02 NOTE — Telephone Encounter (Signed)
Pt is due for adderall and her psych states her vyvanse needed to be increase.pt is coming to get rx today at 2. Also do you know of sny discount coupons or rebates for this med that can be used for self pay

## 2011-04-04 ENCOUNTER — Telehealth: Payer: Self-pay | Admitting: *Deleted

## 2011-04-04 NOTE — Telephone Encounter (Signed)
Pt called stating she started the vyvanse 40mg  2 days ago but has not noticed any difference. I explained to Pt that nothing else would be Rxd for 30 days. I advised Pt to make f/u apt right before refill is due to discuss. Pt agreed

## 2011-04-10 ENCOUNTER — Ambulatory Visit (INDEPENDENT_AMBULATORY_CARE_PROVIDER_SITE_OTHER): Payer: BC Managed Care – PPO | Admitting: Psychology

## 2011-04-10 DIAGNOSIS — F909 Attention-deficit hyperactivity disorder, unspecified type: Secondary | ICD-10-CM

## 2011-04-11 ENCOUNTER — Telehealth: Payer: Self-pay | Admitting: *Deleted

## 2011-04-11 NOTE — Telephone Encounter (Signed)
We can inc her dose at her next refill. Just remind me when call for refill.

## 2011-04-11 NOTE — Telephone Encounter (Signed)
Pt called and states psych feels increase in Vyvanse has not helped and should consider increasing dose again or a different med

## 2011-04-16 NOTE — Telephone Encounter (Signed)
, °

## 2011-04-30 ENCOUNTER — Ambulatory Visit (INDEPENDENT_AMBULATORY_CARE_PROVIDER_SITE_OTHER): Payer: BC Managed Care – PPO | Admitting: Family Medicine

## 2011-04-30 ENCOUNTER — Encounter: Payer: Self-pay | Admitting: Family Medicine

## 2011-04-30 DIAGNOSIS — F909 Attention-deficit hyperactivity disorder, unspecified type: Secondary | ICD-10-CM

## 2011-04-30 MED ORDER — LISDEXAMFETAMINE DIMESYLATE 60 MG PO CAPS: 60.0000 mg | ORAL_CAPSULE | ORAL | Status: DC

## 2011-04-30 NOTE — Progress Notes (Signed)
  Subjective:    Patient ID: Yolanda Mosley, female    DOB: 07/15/1980, 30 y.o.   MRN: [id number]  HPI ADD- says the vavyanse isn't working. Was seeing psych who recommend she inc her dose. .Says only noticed difference when first started it for a couple of days and then felt like it was tapering off. We inc her ot 40mg   Over the phone and still no change in her symptoms. Had dec appetite initially but now back to normal. No sleep problems. No CP, SOB, or  Palpitations.    Review of Systems     Objective:   Physical Exam  Constitutional: She is oriented to person, place, and time. She appears well-developed and well-nourished.  HENT:  Head: Normocephalic and atraumatic.  Cardiovascular: Normal rate, regular rhythm and normal heart sounds.   Pulmonary/Chest: Effort normal and breath sounds normal.  Neurological: She is alert and oriented to person, place, and time.  Skin: Skin is warm and dry.  Psychiatric: She has a normal mood and affect. Her behavior is normal.          Assessment & Plan:  ADD - will inc to 60mg  Vyvanse. If that is not working then will change to concerta. Only wrote 2 weeks worth for her to try. F/u in 1 mo.

## 2011-05-11 ENCOUNTER — Telehealth: Payer: Self-pay | Admitting: *Deleted

## 2011-05-11 MED ORDER — LISDEXAMFETAMINE DIMESYLATE 70 MG PO CAPS: 70.0000 mg | ORAL_CAPSULE | ORAL | Status: DC

## 2011-05-11 NOTE — Telephone Encounter (Signed)
Pt states the Vyvanse 60mg  is helping but she thinks its not strong enough. Pt states she thinks she needs to increase the strength.Please advise

## 2011-05-11 NOTE — Telephone Encounter (Signed)
Okay, will change to 70 mg tablet. This is the highest tablet that comes in. She can come back up the prescription for 30 tablets.

## 2011-05-14 NOTE — Telephone Encounter (Signed)
Pt informed

## 2011-05-17 ENCOUNTER — Encounter: Payer: Self-pay | Admitting: Family Medicine

## 2011-05-17 ENCOUNTER — Ambulatory Visit (INDEPENDENT_AMBULATORY_CARE_PROVIDER_SITE_OTHER): Payer: BC Managed Care – PPO | Admitting: Family Medicine

## 2011-05-17 VITALS — BP 100/69 | HR 131 | Temp 97.6°F | Wt 224.0 lb

## 2011-05-17 DIAGNOSIS — B009 Herpesviral infection, unspecified: Secondary | ICD-10-CM

## 2011-05-17 DIAGNOSIS — J111 Influenza due to unidentified influenza virus with other respiratory manifestations: Secondary | ICD-10-CM

## 2011-05-17 DIAGNOSIS — F909 Attention-deficit hyperactivity disorder, unspecified type: Secondary | ICD-10-CM

## 2011-05-17 DIAGNOSIS — J019 Acute sinusitis, unspecified: Secondary | ICD-10-CM

## 2011-05-17 DIAGNOSIS — J40 Bronchitis, not specified as acute or chronic: Secondary | ICD-10-CM

## 2011-05-17 MED ORDER — AMOXICILLIN-POT CLAVULANATE 875-125 MG PO TABS: 1.0000 | ORAL_TABLET | Freq: Two times a day (BID) | ORAL | Status: AC

## 2011-05-17 MED ORDER — FEXOFENADINE-PSEUDOEPHED ER 180-240 MG PO TB24: 1.0000 | ORAL_TABLET | Freq: Every day | ORAL | Status: DC

## 2011-05-17 MED ORDER — LISDEXAMFETAMINE DIMESYLATE 70 MG PO CAPS: 70.0000 mg | ORAL_CAPSULE | ORAL | Status: DC

## 2011-05-17 MED ORDER — HYDROCOD POLST-CHLORPHEN POLST 10-8 MG/5ML PO LQCR: 5.0000 mL | Freq: Two times a day (BID) | ORAL | Status: DC | PRN

## 2011-05-17 NOTE — Progress Notes (Signed)
  Subjective:    Patient ID: Yolanda Mosley, female    DOB: 09/15/1980, 31 y.o.   MRN: [id number]  Cough This is a new problem. The current episode started in the past 7 days (4 days). The problem has been gradually worsening. The problem occurs constantly. Cough characteristics: occasionaly. Associated symptoms include chest pain, chills, ear congestion, ear pain, headaches, myalgias, nasal congestion, postnasal drip, rhinorrhea, a sore throat, sweats and wheezing. Pertinent negatives include no fever, hemoptysis or rash. The symptoms are aggravated by lying down. Her past medical history is significant for asthma. There is no history of bronchiectasis, bronchitis, COPD, emphysema or pneumonia.  Otalgia  There is pain in both (L>R) ears. The current episode started in the past 7 days. The problem occurs constantly. There has been no fever. The fever has been present for 3 to 4 days. Associated symptoms include coughing, headaches, rhinorrhea and a sore throat. Pertinent negatives include no rash. She has tried nothing for the symptoms. The treatment provided no relief.      Review of Systems  Constitutional: Positive for chills. Negative for fever.  HENT: Positive for ear pain, sore throat, rhinorrhea and postnasal drip.   Respiratory: Positive for cough and wheezing. Negative for hemoptysis.   Cardiovascular: Positive for chest pain.  Genitourinary:       Herpetic out break  Musculoskeletal: Positive for myalgias.  Skin: Negative for rash.  Neurological: Positive for headaches.  All other systems reviewed and are negative.      BP 100/69  Pulse 131  Temp(Src) 97.6 F (36.4 C) (Oral)  Wt 224 lb (101.606 kg)  SpO2 98% Objective:   Physical Exam  Constitutional: She is oriented to person, place, and time. She appears well-developed and well-nourished.  HENT:  Head: Normocephalic.  Neck: Normal range of motion. Neck supple.  Cardiovascular: Normal rate, regular rhythm and normal heart  sounds.   Pulmonary/Chest: Breath sounds normal. No respiratory distress. She has no wheezes.  Neurological: She is alert and oriented to person, place, and time.  Skin: Skin is warm.  Psychiatric: She has a normal mood and affect.          Assessment & Plan:  Sinusitis  Will place on Augmentin , allegra D  Bronchitis use inhaler at home tussionex for cough Influenza treat symptomatic herpetic infection need to meet w/PCP to discuss prophylaxis treatment

## 2011-05-17 NOTE — Patient Instructions (Signed)
herpesHerpes Zoster Virus Vaccine What is this medicine? HERPES ZOSTER VIRUS VACCINE (HUR peez ZOS ter vahy ruhs vak SEEN) is a vaccine. It is used to prevent shingles in adults 30 years old and over. This vaccine is not used to treat shingles or nerve pain from shingles. This medicine may be used for other purposes; ask your health care provider or pharmacist if you have questions. What should I tell my health care provider before I take this medicine? They need to know if you have any of these conditions: -cancer like leukemia or lymphoma -immune system problems or therapy -infection with fever -tuberculosis -an unusual or allergic reaction to vaccines, neomycin, gelatin, other medicines, foods, dyes, or preservatives -pregnant or trying to get pregnant -breast-feeding How should I use this medicine? This vaccine is for injection under the skin. It is given by a health care professional. Talk to your pediatrician regarding the use of this medicine in children. This medicine is not approved for use in children. Overdosage: If you think you have taken too much of this medicine contact a poison control center or emergency room at once. NOTE: This medicine is only for you. Do not share this medicine with others. What if I miss a dose? This does not apply. What may interact with this medicine? Do not take this medicine with any of the following medications: -adalimumab -anakinra -etanercept -infliximab -medicines to treat cancer -medicines that suppress your immune system This medicine may also interact with the following medications: -immunoglobulins -steroid medicines like prednisone or cortisone This list may not describe all possible interactions. Give your health care provider a list of all the medicines, herbs, non-prescription drugs, or dietary supplements you use. Also tell them if you smoke, drink alcohol, or use illegal drugs. Some items may interact with your medicine. What  should I watch for while using this medicine? Visit your doctor for regular check ups. This vaccine, like all vaccines, may not fully protect everyone. After receiving this vaccine it may be possible to pass chickenpox infection to others. Avoid people with immune system problems, pregnant women who have not had chickenpox, and newborns of women who have not had chickenpox. Talk to your doctor for more information. What side effects may I notice from receiving this medicine? Side effects that you should report to your doctor or health care professional as soon as possible: -allergic reactions like skin rash, itching or hives, swelling of the face, lips, or tongue -breathing problems -feeling faint or lightheaded, falls -fever, flu-like symptoms -pain, tingling, numbness in the hands or feet -swelling of the ankles, feet, hands -unusually weak or tired Side effects that usually do not require medical attention (report to your doctor or health care professional if they continue or are bothersome): -aches or pains -chickenpox-like rash -diarrhea -headache -loss of appetite -nausea, vomiting -redness, pain, swelling at site where injected -runny nose This list may not describe all possible side effects. Call your doctor for medical advice about side effects. You may report side effects to FDA at 1-800-FDA-1088. Where should I keep my medicine? This drug is given in a hospital or clinic and will not be stored at home. NOTE: This sheet is a summary. It may not cover all possible information. If you have questions about this medicine, talk to your doctor, pharmacist, or health care provider.  2012, Elsevier/[employer] Standard. (10/03/2009 5:43:50 PM)

## 2011-05-31 ENCOUNTER — Encounter: Payer: Self-pay | Admitting: Family Medicine

## 2011-05-31 ENCOUNTER — Other Ambulatory Visit: Payer: Self-pay | Admitting: Family Medicine

## 2011-05-31 ENCOUNTER — Ambulatory Visit (INDEPENDENT_AMBULATORY_CARE_PROVIDER_SITE_OTHER): Payer: BC Managed Care – PPO | Admitting: Family Medicine

## 2011-05-31 VITALS — BP 133/82 | HR 84 | Wt 222.0 lb

## 2011-05-31 DIAGNOSIS — R059 Cough, unspecified: Secondary | ICD-10-CM

## 2011-05-31 DIAGNOSIS — E669 Obesity, unspecified: Secondary | ICD-10-CM

## 2011-05-31 DIAGNOSIS — F988 Other specified behavioral and emotional disorders with onset usually occurring in childhood and adolescence: Secondary | ICD-10-CM

## 2011-05-31 DIAGNOSIS — R05 Cough: Secondary | ICD-10-CM

## 2011-05-31 DIAGNOSIS — N760 Acute vaginitis: Secondary | ICD-10-CM

## 2011-05-31 MED ORDER — VALACYCLOVIR HCL 500 MG PO TABS: 500.0000 mg | ORAL_TABLET | Freq: Every day | ORAL | Status: AC

## 2011-05-31 MED ORDER — METHYLPHENIDATE HCL ER (OSM) 54 MG PO TBCR: 54.0000 mg | EXTENDED_RELEASE_TABLET | ORAL | Status: DC

## 2011-05-31 MED ORDER — FLUCONAZOLE 150 MG PO TABS: 150.0000 mg | ORAL_TABLET | Freq: Once | ORAL | Status: AC

## 2011-05-31 NOTE — Progress Notes (Signed)
  Subjective:    Patient ID: Yolanda Mosley, female    DOB: 1980/07/07, 31 y.o.   MRN: [id number]  HPI Says feels the vyvanse does help some but not a lot and would like to try something else.  He has lost almost 30 pounds since the Vyvanse. She does have a decreased appetite on it and says she tries to eat regular meals but sometimes doesn't. It is not affecting her sleep.  Sinusitis - She is much better but still has some cough. But overall much better.   Was on augmentin and finished about 5 days ago and started getting vaginal itching and stinging about 2 days later.  No bumps or sores, doesn't think it is her herpes.    She would also like to discuss daily prophylaxis for genital herpes.   Review of Systems     Objective:   Physical Exam  Constitutional: She is oriented to person, place, and time. She appears well-developed and well-nourished.  HENT:  Head: Normocephalic and atraumatic.  Right Ear: External ear normal.  Left Ear: External ear normal.  Nose: Nose normal.  Mouth/Throat: Oropharynx is clear and moist.       TMs and canals are clear.   Eyes: Conjunctivae and EOM are normal. Pupils are equal, round, and reactive to light.  Neck: Neck supple. No thyromegaly present.  Cardiovascular: Normal rate, regular rhythm and normal heart sounds.   Pulmonary/Chest: Effort normal and breath sounds normal. She has no wheezes.  Lymphadenopathy:    She has no cervical adenopathy.  Neurological: She is alert and oriented to person, place, and time.  Skin: Skin is warm and dry.  Psychiatric: She has a normal mood and affect.          Assessment & Plan:  Sinusitis - Resolved.explained cough may take another couple of weeks to completely resolve but exam is norma.   Vaginitis - will send wet prep. We'll then treat for yeast vaginitis we'll send her prescription for Diflucan 150 mg.  ADHD - Will  change to concernta. F/U in 1 months. Monitor weight, BP, and sleep.    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 Will start valtrex 500mg  QD since < 10 outbreaks per year.

## 2011-06-03 ENCOUNTER — Other Ambulatory Visit: Payer: Self-pay | Admitting: Family Medicine

## 2011-06-06 LAB — WET PREP, GENITAL: WBC, Wet Prep HPF POC: NONE SEEN

## 2011-06-07 ENCOUNTER — Telehealth: Payer: Self-pay | Admitting: *Deleted

## 2011-06-07 ENCOUNTER — Other Ambulatory Visit: Payer: Self-pay | Admitting: Family Medicine

## 2011-06-07 MED ORDER — FAMCICLOVIR 250 MG PO TABS: 250.0000 mg | ORAL_TABLET | Freq: Two times a day (BID) | ORAL | Status: AC

## 2011-06-07 MED ORDER — ACYCLOVIR 5 % EX OINT: TOPICAL_OINTMENT | CUTANEOUS | Status: AC

## 2011-06-19 NOTE — Telephone Encounter (Signed)
closed

## 2011-06-22 ENCOUNTER — Encounter: Payer: Self-pay | Admitting: *Deleted

## 2011-06-25 ENCOUNTER — Other Ambulatory Visit: Payer: Self-pay | Admitting: *Deleted

## 2011-06-25 MED ORDER — BENAZEPRIL-HYDROCHLOROTHIAZIDE 20-25 MG PO TABS: 1.0000 | ORAL_TABLET | Freq: Every day | ORAL | Status: DC

## 2011-06-28 ENCOUNTER — Ambulatory Visit: Payer: BC Managed Care – PPO | Admitting: Family Medicine

## 2011-07-20 LAB — LIPID PANEL
Cholesterol: 127 mg/dL (ref 0–200)
HDL: 52 mg/dL (ref 35–70)
Triglycerides: 50 mg/dL (ref 40–160)

## 2011-07-23 ENCOUNTER — Encounter: Payer: Self-pay | Admitting: *Deleted

## 2011-07-23 ENCOUNTER — Other Ambulatory Visit: Payer: Self-pay | Admitting: *Deleted

## 2011-07-23 MED ORDER — METHYLPHENIDATE HCL ER (OSM) 54 MG PO TBCR: 54.0000 mg | EXTENDED_RELEASE_TABLET | ORAL | Status: DC

## 2011-08-15 ENCOUNTER — Telehealth: Payer: Self-pay | Admitting: *Deleted

## 2011-08-15 NOTE — Telephone Encounter (Signed)
Informed pt to make appt

## 2011-08-15 NOTE — Telephone Encounter (Signed)
OK, see if Yolanda Mosley has any appt.

## 2011-08-15 NOTE — Telephone Encounter (Signed)
LM for pt to returncall

## 2011-08-15 NOTE — Telephone Encounter (Signed)
Pt states that she has been taking Allegra and wants to know what else she can take. States that she is having HA, throat and ear pain, runny nose, sneezing. States she can come in today if she needs too. Please advise.

## 2011-08-16 ENCOUNTER — Ambulatory Visit
Admission: RE | Admit: 2011-08-16 | Discharge: 2011-08-16 | Disposition: A | Discharge status: Home / Self Care | Payer: BC Managed Care – PPO | Source: Ambulatory Visit | Attending: Family Medicine | Admitting: Family Medicine

## 2011-08-16 ENCOUNTER — Ambulatory Visit (INDEPENDENT_AMBULATORY_CARE_PROVIDER_SITE_OTHER): Payer: BC Managed Care – PPO | Admitting: Family Medicine

## 2011-08-16 ENCOUNTER — Encounter: Payer: Self-pay | Admitting: Family Medicine

## 2011-08-16 VITALS — BP 133/78 | HR 100 | Temp 98.3°F | Ht 65.0 in | Wt 226.0 lb

## 2011-08-16 DIAGNOSIS — M79609 Pain in unspecified limb: Secondary | ICD-10-CM

## 2011-08-16 DIAGNOSIS — M79675 Pain in left toe(s): Secondary | ICD-10-CM

## 2011-08-16 DIAGNOSIS — J309 Allergic rhinitis, unspecified: Secondary | ICD-10-CM

## 2011-08-16 DIAGNOSIS — F909 Attention-deficit hyperactivity disorder, unspecified type: Secondary | ICD-10-CM

## 2011-08-16 MED ORDER — CICLESONIDE 37 MCG/ACT NA AERS: 2.0000 | INHALATION_SPRAY | Freq: Every day | NASAL | Status: DC

## 2011-08-16 NOTE — Patient Instructions (Signed)
Allergic Rhinitis  Allergic rhinitis is when the mucous membranes in the nose respond to allergens. Allergens are particles in the air that cause your body to have an allergic reaction. This causes you to release allergic antibodies. Through a chain of events, these eventually cause you to release histamine into the blood stream (hence the use of antihistamines). Although meant to be protective to the body, it is this release that causes your discomfort, such as frequent sneezing, congestion and an itchy runny nose.    CAUSES    The pollen allergens may come from grasses, trees, and weeds. This is seasonal allergic rhinitis, or "hay fever." Other allergens cause year-round allergic rhinitis (perennial allergic rhinitis) such as house dust mite allergen, pet dander and mold spores.    SYMPTOMS     Nasal stuffiness (congestion).   Runny, itchy nose with sneezing and tearing of the eyes.   There is often an itching of the mouth, eyes and ears.  It cannot be cured, but it can be controlled with medications.  DIAGNOSIS    If you are unable to determine the offending allergen, skin or blood testing may find it.  TREATMENT     Avoid the allergen.   Medications and allergy shots (immunotherapy) can help.   Hay fever may often be treated with antihistamines in pill or nasal spray forms. Antihistamines block the effects of histamine. There are over-the-counter medicines that may help with nasal congestion and swelling around the eyes. Check with your caregiver before taking or giving this medicine.  If the treatment above does not work, there are many new medications your caregiver can prescribe. Stronger medications may be used if initial measures are ineffective. Desensitizing injections can be used if medications and avoidance fails. Desensitization is when a patient is given ongoing shots until the body becomes less sensitive to the allergen. Make sure you follow up with your caregiver if problems continue.  SEEK  MEDICAL CARE IF:     You develop fever (more than 100.5 F (38.1 C).   You develop a cough that does not stop easily (persistent).   You have shortness of breath.   You start wheezing.   Symptoms interfere with normal daily activities.  Document Released: 01/09/2001 Document Revised: 04/05/2011 Document Reviewed: 07/21/2008  [employer] Patient Information 2012 [employer].

## 2011-08-16 NOTE — Progress Notes (Signed)
  Subjective:    Patient ID: Yolanda Mosley, female    DOB: Nov 05, 1980, 31 y.o.   MRN: [id number]  HPI Allergies.  Has had HA, since congestion.  No fever.  Eyes are red and itchy.  Mild ST and ear pain bilat.  Mild HA.  Mild  Post nasal drainag and mild cough.  +rhinorrhea.  Worse in the AM  Has been taking allegra righ tnow. Helps some but not enough.    ADD - Feels the vyvanse works better than the concerta.  Says right now there is a big price difference. No chest pain or shortness of breath. She does for the Concerta helps. She does have a prescription for a couple weeks ago. No sleeping problems. Weight is stable.  Pain in 5th toe, right foot.  Kicked her rooomates hand by accident. Was purple nad swollen.  Purple is resolved but still mildly swollen and every sore to wear shoes.  Better if walks barefoot. Worse if full extends toe.  Injury was 6 days ago.      Review of Systems     Objective:   Physical Exam  Constitutional: She is oriented to person, place, and time. She appears well-developed and well-nourished.  HENT:  Head: Normocephalic and atraumatic.  Right Ear: External ear normal.  Left Ear: External ear normal.  Nose: Nose normal.  Mouth/Throat: Oropharynx is clear and moist.       TMs and canals are clear.   Eyes: Conjunctivae and EOM are normal. Pupils are equal, round, and reactive to light.  Neck: Neck supple. No thyromegaly present.  Cardiovascular: Normal rate, regular rhythm and normal heart sounds.   Pulmonary/Chest: Effort normal and breath sounds normal. She has no wheezes.  Musculoskeletal:       Right fifth toe is very swollen. It is tender at the distal joint. No popping or cracking or sign of infection. Approximate skin. They're still a little bit of residual bruising.  Lymphadenopathy:    She has no cervical adenopathy.  Neurological: She is alert and oriented to person, place, and time.  Skin: Skin is warm and dry.  Psychiatric: She has a normal mood and  affect.          Assessment & Plan:  Allergies.  Conitinue allegra dn will add nasal steroid spray.  Given samples of zetonna to try. Call if/when needs rx.  If her symptoms are not significantly controlled in the next 2 weeks and please give [place] a call. We could consider adding a nasal antihistamine at that time.  ADD - we discussed that for now she will stay with the Concerta even though she feels it is less effective than the Vyvanse. Just because of cost issues. If she finds it on her normal line or gets a point where her co-pay changes we can certainly change it back to Vyvanse.  Pain in 5th toe- will get xray. Likely fracture to the toe. We'll get an x-ray just to make sure it doesn't get into the joints and she does have significant swelling on exam today. We will call her with the results. We discussed that it is a hairline fracture the treatment mostly by taping the toe to the fourth toe. In addition using a postop shoe for 2-3 weeks. She wanted to hold off on the postop shoe.

## 2011-09-05 ENCOUNTER — Other Ambulatory Visit: Payer: Self-pay | Admitting: *Deleted

## 2011-09-05 MED ORDER — METHYLPHENIDATE HCL ER (OSM) 54 MG PO TBCR: 54.0000 mg | EXTENDED_RELEASE_TABLET | ORAL | Status: DC

## 2011-09-14 ENCOUNTER — Ambulatory Visit: Payer: BC Managed Care – PPO | Admitting: Family Medicine

## 2011-09-14 DIAGNOSIS — Z0289 Encounter for other administrative examinations: Secondary | ICD-10-CM

## 2011-10-08 ENCOUNTER — Other Ambulatory Visit: Payer: Self-pay | Admitting: *Deleted

## 2011-10-08 MED ORDER — METHYLPHENIDATE HCL ER (OSM) 54 MG PO TBCR: 54.0000 mg | EXTENDED_RELEASE_TABLET | ORAL | Status: DC

## 2011-10-09 ENCOUNTER — Telehealth: Payer: Self-pay | Admitting: *Deleted

## 2011-10-09 MED ORDER — AMPHETAMINE-DEXTROAMPHET ER 30 MG PO CP24: 30.0000 mg | ORAL_CAPSULE | ORAL | Status: DC

## 2011-10-09 MED ORDER — ACYCLOVIR 400 MG PO TABS: 400.0000 mg | ORAL_TABLET | Freq: Two times a day (BID) | ORAL | Status: DC

## 2011-10-09 NOTE — Telephone Encounter (Signed)
Pt called and states her insurance no longer covers concerta or valtrex. Pt wants to know if we can switch to Adderall XR and acyclovir.

## 2011-10-09 NOTE — Telephone Encounter (Signed)
Pt.notified

## 2011-10-09 NOTE — Telephone Encounter (Signed)
OK, new rx sent for acyclovir. Can pick up new rx for adderral XR.

## 2011-10-11 ENCOUNTER — Telehealth: Payer: Self-pay | Admitting: *Deleted

## 2011-10-11 NOTE — Telephone Encounter (Signed)
We can change when due for refill.

## 2011-10-11 NOTE — Telephone Encounter (Signed)
Pt states it will be cheaper for her to get reg Adderall instead of Adderal xr. Pt would like another rx for this instead of adderall xr

## 2011-10-12 MED ORDER — AMPHETAMINE-DEXTROAMPHETAMINE 20 MG PO TABS: 20.0000 mg | ORAL_TABLET | Freq: Two times a day (BID) | ORAL | Status: DC

## 2011-10-12 NOTE — Telephone Encounter (Signed)
They did run it as generic and its still $130.

## 2011-10-12 NOTE — Telephone Encounter (Signed)
Can we call the pharmacy and find out if they ran it as a generic? This seems really high unless it if was the brand.

## 2011-10-12 NOTE — Telephone Encounter (Signed)
We got an approval through [place] for Adderall XR however it is still over 100 for pt. Pt was notified this am of this and we could change rx when refill due. Pt insisted that she needed the med and couldn't afford 100 and the reg Adderall was only $20. Advised pt that if she brought the rx back in the office for the adderall xr then we could change the rx.Pt agreed and states she will come in on Mon

## 2011-10-12 NOTE — Telephone Encounter (Signed)
OK they can cancel rx and I will send regular release.

## 2011-10-15 ENCOUNTER — Telehealth: Payer: Self-pay | Admitting: *Deleted

## 2011-10-15 MED ORDER — FEXOFENADINE HCL 180 MG PO TABS: 180.0000 mg | ORAL_TABLET | Freq: Every day | ORAL | Status: DC

## 2011-10-15 NOTE — Telephone Encounter (Signed)
Pt called and states insurance requires a written rx for allegra before they will pay for it

## 2011-10-27 ENCOUNTER — Other Ambulatory Visit: Payer: Self-pay | Admitting: Family Medicine

## 2011-12-06 ENCOUNTER — Other Ambulatory Visit: Payer: Self-pay | Admitting: *Deleted

## 2011-12-06 MED ORDER — AMPHETAMINE-DEXTROAMPHETAMINE 20 MG PO TABS: 20.0000 mg | ORAL_TABLET | Freq: Two times a day (BID) | ORAL | Status: DC

## 2012-01-13 ENCOUNTER — Other Ambulatory Visit: Payer: Self-pay | Admitting: Family Medicine

## 2012-02-04 ENCOUNTER — Ambulatory Visit (INDEPENDENT_AMBULATORY_CARE_PROVIDER_SITE_OTHER): Payer: BC Managed Care – PPO | Admitting: Physician Assistant

## 2012-02-04 ENCOUNTER — Encounter: Payer: Self-pay | Admitting: Physician Assistant

## 2012-02-04 ENCOUNTER — Telehealth: Payer: Self-pay | Admitting: *Deleted

## 2012-02-04 VITALS — BP 119/72 | HR 77 | Temp 97.7°F | Ht 65.0 in | Wt 217.0 lb

## 2012-02-04 DIAGNOSIS — H6090 Unspecified otitis externa, unspecified ear: Secondary | ICD-10-CM

## 2012-02-04 DIAGNOSIS — J029 Acute pharyngitis, unspecified: Secondary | ICD-10-CM

## 2012-02-04 DIAGNOSIS — H60399 Other infective otitis externa, unspecified ear: Secondary | ICD-10-CM

## 2012-02-04 DIAGNOSIS — F988 Other specified behavioral and emotional disorders with onset usually occurring in childhood and adolescence: Secondary | ICD-10-CM

## 2012-02-04 MED ORDER — CIPROFLOXACIN-DEXAMETHASONE 0.3-0.1 % OT SUSP: 4.0000 [drp] | Freq: Two times a day (BID) | OTIC | Status: DC

## 2012-02-04 MED ORDER — LISDEXAMFETAMINE DIMESYLATE 70 MG PO CAPS: 70.0000 mg | ORAL_CAPSULE | ORAL | Status: DC

## 2012-02-04 MED ORDER — OFLOXACIN 0.3 % OT SOLN: 10.0000 [drp] | Freq: Every day | OTIC | Status: DC

## 2012-02-04 NOTE — Telephone Encounter (Signed)
Ciprodex otic drops are too expensive w/ insurance. Please send generic to pharm

## 2012-02-04 NOTE — Patient Instructions (Addendum)
Decongestant for next 48 hours. Take Motrin for pain. Start ear drops. Call on Wednesday if not any better.   Otitis Externa Otitis externa is a bacterial or fungal infection of the outer ear canal. This is the area from the eardrum to the outside of the ear. Otitis externa is sometimes called "swimmer's ear." CAUSES  Possible causes of infection include:  Swimming in dirty water.  Moisture remaining in the ear after swimming or bathing.  Mild injury (trauma) to the ear.  Objects stuck in the ear (foreign body).  Cuts or scrapes (abrasions) on the outside of the ear. SYMPTOMS  The first symptom of infection is often itching in the ear canal. Later signs and symptoms may include swelling and redness of the ear canal, ear pain, and yellowish-white fluid (pus) coming from the ear. The ear pain may be worse when pulling on the earlobe. DIAGNOSIS  Your caregiver will perform a physical exam. A sample of fluid may be taken from the ear and examined for bacteria or fungi. TREATMENT  Antibiotic ear drops are often given for 10 to 14 days. Treatment may also include pain medicine or corticosteroids to reduce itching and swelling. PREVENTION   Keep your ear dry. Use the corner of a towel to absorb water out of the ear canal after swimming or bathing.  Avoid scratching or putting objects inside your ear. This can damage the ear canal or remove the protective wax that lines the canal. This makes it easier for bacteria and fungi to grow.  Avoid swimming in lakes, polluted water, or poorly chlorinated pools.  You may use ear drops made of rubbing alcohol and vinegar after swimming. Combine equal parts of white vinegar and alcohol in a bottle. Put 3 or 4 drops into each ear after swimming. HOME CARE INSTRUCTIONS   Apply antibiotic ear drops to the ear canal as prescribed by your caregiver.  Only take over-the-counter or prescription medicines for pain, discomfort, or fever as directed by your  caregiver.  If you have diabetes, follow any additional treatment instructions from your caregiver.  Keep all follow-up appointments as directed by your caregiver. SEEK MEDICAL CARE IF:   You have a fever.  Your ear is still red, swollen, painful, or draining pus after 3 days.  Your redness, swelling, or pain gets worse.  You have a severe headache.  You have redness, swelling, pain, or tenderness in the area behind your ear. MAKE SURE YOU:   Understand these instructions.  Will watch your condition.  Will get help right away if you are not doing well or get worse. Document Released: 04/16/2005 Document Revised: 07/09/2011 Document Reviewed: 05/03/2011 [facility] Patient Information 2013 [place], [place].

## 2012-02-04 NOTE — Telephone Encounter (Signed)
Sent floxin. Hopefully this will be cheaper.

## 2012-02-04 NOTE — Addendum Note (Signed)
Addended by: Ellsworth Lennox on: 02/04/2012 11:21 AM   Modules accepted: Orders

## 2012-02-04 NOTE — Progress Notes (Signed)
  Subjective:    Patient ID: Yolanda Mosley, female    DOB: Oct 06, 1980, 31 y.o.   MRN: [id number]  HPI Patient is a 31 yo female who presents to the clinic with sore throat and left ear pain for 5 days. She has a history of strep throat and ear infections. She lives where her nieces and nephews and they have all been sick lately. Her throat hurts when she swallows and feels like there is a lump in her throat. It is painful to eat. Her left ear and left side of her jaw is also very painful. She has not noticed any drainage from ear but hurts to lay on that side. She denies any fever, chills, runny nose, cough, SOB,, sinus pressure, headache, or abdominal pain. She has not tried anything to make better and seems that it continues to get worse.   She also has ADD and on adderall. She feels adderal is not working but that is all her insurance will pay for. vyvanse worked the best but too expensive. Her boss has noticed that she is not as effective on adderall as vyvanse.    Review of Systems     Objective:   Physical Exam  Constitutional: She is oriented to person, place, and time. She appears well-developed and well-nourished.  HENT:  Head: Normocephalic and atraumatic.  Right Ear: External ear normal.       TM and external canal normal right ear.   Left ear tragus tender to palpation, external canal erythematous with TM erythematous and dull. NO blood or pus.   Oropharynx is red and swollen with white pockets on bilateral tonsils.   Negative for maxillary or frontal tenderness to palpation.  Eyes: Conjunctivae normal are normal.  Neck: Normal range of motion.       Tender and enlarged bilateral cervical adenopathy.   Cardiovascular: Normal rate, regular rhythm and normal heart sounds.   Pulmonary/Chest: Effort normal and breath sounds normal. She has no wheezes.  Abdominal: Soft. Bowel sounds are normal.  Neurological: She is alert and oriented to person, place, and time.  Skin: Skin is warm  and dry.  Psychiatric: She has a normal mood and affect. Her behavior is normal.          Assessment & Plan:  Sore throat-Rapid strep negative. Throat culture pending. Discussed symptomatic care of sore throat: salt water gargles and motrin. Call if not improving in next 2-3 days. Motrin for pain control.  Otitis externa- Gave ciprodex for left ear. Consider decongestant for next 48 hours. Motrin for pain.  ADD- stopped Adderall. Gave coupon for vyvanse and gave rx for 70mg  daily.

## 2012-02-06 ENCOUNTER — Telehealth: Payer: Self-pay | Admitting: *Deleted

## 2012-02-06 LAB — CULTURE, GROUP A STREP: Organism ID, Bacteria: NORMAL

## 2012-02-06 MED ORDER — AMOXICILLIN-POT CLAVULANATE 875-125 MG PO TABS: 1.0000 | ORAL_TABLET | Freq: Two times a day (BID) | ORAL | Status: DC

## 2012-02-06 NOTE — Telephone Encounter (Signed)
Pt notified via VM. KG LPN 

## 2012-02-06 NOTE — Telephone Encounter (Signed)
Sent Augmentin to pharmacy. Take all 10 days of it. Call if not improving.

## 2012-02-06 NOTE — Telephone Encounter (Signed)
Pt calls and states that her ear feels worse and her throat is still hurting and was told to call back if not better. Please advise

## 2012-03-12 ENCOUNTER — Encounter: Payer: Self-pay | Admitting: Family Medicine

## 2012-03-12 ENCOUNTER — Ambulatory Visit (INDEPENDENT_AMBULATORY_CARE_PROVIDER_SITE_OTHER): Payer: BC Managed Care – PPO | Admitting: Family Medicine

## 2012-03-12 VITALS — BP 123/80 | HR 101 | Temp 97.6°F | Wt 210.0 lb

## 2012-03-12 DIAGNOSIS — J209 Acute bronchitis, unspecified: Secondary | ICD-10-CM

## 2012-03-12 DIAGNOSIS — A499 Bacterial infection, unspecified: Secondary | ICD-10-CM

## 2012-03-12 DIAGNOSIS — H60399 Other infective otitis externa, unspecified ear: Secondary | ICD-10-CM

## 2012-03-12 DIAGNOSIS — B9689 Other specified bacterial agents as the cause of diseases classified elsewhere: Secondary | ICD-10-CM

## 2012-03-12 DIAGNOSIS — H6092 Unspecified otitis externa, left ear: Secondary | ICD-10-CM

## 2012-03-12 MED ORDER — AZITHROMYCIN 250 MG PO TABS: ORAL_TABLET | ORAL | Status: AC

## 2012-03-12 MED ORDER — NEOMYCIN-POLYMYXIN-HC 3.5-10000-1 OT SOLN: 4.0000 [drp] | Freq: Three times a day (TID) | OTIC | Status: AC

## 2012-03-12 NOTE — Progress Notes (Signed)
CC: Yolanda Mosley is a 31 y.o. female is here for Nasal Congestion, Otalgia and Headache   Subjective: HPI:  Patient reports one week of worsening cough which is productive producing greenish phlegm without blood nor rust discoloration. Has been accompanied by mild fatigue and on and off hot and cold sensation but no objective fevers. Associated with left ear pain. Nothing seems to make the above symptoms better or worse other than cold air helps some mild shortness of breath. They're present 24 hours a day. Not interfering with her sleep. No interventions as of yet. Last month she was diagnosed with otitis externa but never used the eardrops prescribed, she was prescribed Augmentin but admits to only taking it for 7 days as symptoms were completely resolved by that time. Denies dysphagia, orthopnea, confusion, exertional chest pain, back pain, nausea, vomiting, abdominal pain, nor rash   Review Of Systems Outlined In HPI  Past Medical History  Diagnosis Date  . CA - cancer 2005  . Hemorrhoids     laser tx      Family History  Problem Relation Age of Onset  . Hypertension Mother   . Diabetes Other   . Other Other     cardiovascular disorder     History  Substance Use Topics  . Smoking status: Never Smoker   . Smokeless tobacco: Not on file  . Alcohol Use: Yes     Comment: occasionally     Objective: Filed Vitals:   03/12/12 0926  BP: 123/80  Pulse: 101  Temp: 97.6 F (36.4 C)    General: Alert and Oriented, No Acute Distress HEENT: Pupils equal, round, reactive to light. Conjunctivae clear.  External ears unremarkable, right canal clear and unremarkable however left canal moderately erythematous with mild edema, TMs with appropriate landmarks.  Middle ear appears open without effusion. Pink inferior turbinates.  Moist mucous membranes, pharynx without inflammation however left tonsil has 3 white plaques/exudates, uvula is midline  Neck supple without palpable lymphadenopathy  nor abnormal masses. Lungs: Mild central rhonchi without rales nor wheezing.  Comfortable work of breathing. Good air movement. Intermittent coughing during encounter Cardiac: Regular rate and rhythm. Normal S1/S2.  No murmurs, rubs, nor gallops.    Mental Status: No depression, anxiety, nor agitation. Skin: Warm and dry.  Assessment & Plan: Frida was seen today for nasal congestion, otalgia and headache.  Diagnoses and associated orders for this visit:  Otitis externa, left - neomycin-polymyxin-hydrocortisone (CORTISPORIN) otic solution; Place 4 drops into the left ear 3 (three) times daily.  Acute bronchitis, bacterial - azithromycin (ZITHROMAX) 250 MG tablet; Take two tabs at once on day 1, then one tab daily on days 2-5.    Left otitis externa , Ciprodex over $100 therefore will try above. She may possibly have strep given her throat exam I do believe she has bronchitis or the worst walking pneumonia, no signs of consolidation and no desaturations while walking sats remained at 98%. Ibuprofen and fluids encouraged, stressed importance of taking medications as prescribed.Signs and symptoms requring emergent/urgent reevaluation were discussed with the patient.  Return friday if no better.

## 2012-04-11 ENCOUNTER — Other Ambulatory Visit: Payer: Self-pay | Admitting: Family Medicine

## 2012-04-14 ENCOUNTER — Other Ambulatory Visit: Payer: Self-pay | Admitting: *Deleted

## 2012-04-14 MED ORDER — LISDEXAMFETAMINE DIMESYLATE 70 MG PO CAPS: 70.0000 mg | ORAL_CAPSULE | ORAL | Status: DC

## 2012-04-15 ENCOUNTER — Ambulatory Visit (INDEPENDENT_AMBULATORY_CARE_PROVIDER_SITE_OTHER): Payer: BC Managed Care – PPO | Admitting: Sports Medicine

## 2012-04-15 ENCOUNTER — Encounter: Payer: Self-pay | Admitting: Sports Medicine

## 2012-04-15 VITALS — BP 105/73 | HR 96 | Wt 205.0 lb

## 2012-04-15 DIAGNOSIS — R000 Tachycardia, unspecified: Secondary | ICD-10-CM

## 2012-04-15 MED ORDER — ATENOLOL 50 MG PO TABS: 25.0000 mg | ORAL_TABLET | Freq: Every day | ORAL | Status: DC

## 2012-04-15 NOTE — Assessment & Plan Note (Addendum)
She has seen a cardiologist for this before, and was recommended to go on a beta blocker. Unfortunately this dropped her blood pressure too low on her current medications. I would like her to stop her current blood pressure medicines, and I think atenolol would be a good option considering her episodes of tachycardia, blood pressure, and anxiety (she often feels like she is going a mile-a-minute). She did have some hypercalcemia and a previous visit, I want to recheck some blood work. She will come back to see me in 2 weeks to reassess blood pressure, heart rate. We can followup labs at that point as well.

## 2012-04-15 NOTE — Progress Notes (Signed)
Subjective:    CC: Elevated heart rate  HPI: Yolanda Mosley comes in to discuss an episode of asymptomatic tachycardia. She has a history of this, and it occurs infrequently, once every few months. She has already been to see a cardiologist who recommended carvedilol. Unfortunately carvedilol when combined with her current blood pressure regimen dropped her pressure too low, and she did not continue this. Yesterday, while working she took her heart rate and blood pressure. She noted the blood pressure to be normal, however heart rate was in the 120s. She denied any related chest pain, nausea, diaphoresis, or presyncope at that time. Typically, she is asymptomatic when she notes her heart rate to be elevated. She is taking Vyvanse, but these episodes have occurred since before she started this medication. Right now, she remains asymptomatic.  Past medical history, Surgical history, Family history, Social history, Allergies, and medications have been entered into the medical record, reviewed, and no changes needed.   Review of Systems: No fevers, chills, night sweats, weight loss, chest pain, or shortness of breath.   Objective:    General: Well Developed, well nourished, and in no acute distress. Speech is very pressured, and her mind appears to be going a mile-a-minute. Neuro: Alert and oriented x3, extra-ocular muscles intact.  HEENT: Normocephalic, atraumatic, pupils equal round reactive to light, neck supple, no masses, no lymphadenopathy, thyroid nonpalpable.  Skin: Warm and dry, no rashes. Cardiac: Regular rate and rhythm, no murmurs rubs or gallops.  Respiratory: Clear to auscultation bilaterally. Not using accessory muscles, speaking in full sentences. There is no lower extremity edema. Pulses are full, and symmetric.   Impression and Recommendations:

## 2012-04-16 LAB — CBC
HCT: 42.1 % (ref 36.0–46.0)
Hemoglobin: 14.1 g/dL (ref 12.0–15.0)
MCH: 27.2 pg (ref 26.0–34.0)
MCHC: 33.5 g/dL (ref 30.0–36.0)
MCV: 81.1 fL (ref 78.0–100.0)
Platelets: 253 10*3/uL (ref 150–400)
RBC: 5.19 MIL/uL — ABNORMAL HIGH (ref 3.87–5.11)
RDW: 14.6 % (ref 11.5–15.5)
WBC: 6.5 K/uL (ref 4.0–10.5)

## 2012-04-16 LAB — COMPREHENSIVE METABOLIC PANEL
ALT: 16 U/L (ref 0–35)
Albumin: 4.1 g/dL (ref 3.5–5.2)
Alkaline Phosphatase: 104 U/L (ref 39–117)
Glucose, Bld: 90 mg/dL (ref 70–99)
Potassium: 3.4 mEq/L — ABNORMAL LOW (ref 3.5–5.3)
Sodium: 139 mEq/L (ref 135–145)
Total Bilirubin: 0.6 mg/dL (ref 0.3–1.2)
Total Protein: 7.2 g/dL (ref 6.0–8.3)

## 2012-04-16 LAB — COMPREHENSIVE METABOLIC PANEL WITH GFR
AST: 19 U/L (ref 0–37)
BUN: 13 mg/dL (ref 6–23)
CO2: 31 meq/L (ref 19–32)
Calcium: 9.8 mg/dL (ref 8.4–10.5)
Chloride: 96 meq/L (ref 96–112)
Creat: 0.68 mg/dL (ref 0.50–1.10)

## 2012-04-16 LAB — TSH: TSH: 3.094 u[IU]/mL (ref 0.350–4.500)

## 2012-04-16 NOTE — Addendum Note (Signed)
Addended by: Monica Becton on: 04/16/2012 08:50 AM   Modules accepted: Orders

## 2012-04-23 ENCOUNTER — Other Ambulatory Visit: Payer: Self-pay | Admitting: Family Medicine

## 2012-04-29 ENCOUNTER — Ambulatory Visit (INDEPENDENT_AMBULATORY_CARE_PROVIDER_SITE_OTHER): Payer: BC Managed Care – PPO | Admitting: Sports Medicine

## 2012-04-29 ENCOUNTER — Encounter: Payer: Self-pay | Admitting: Sports Medicine

## 2012-04-29 VITALS — BP 132/84 | HR 71 | Wt 220.0 lb

## 2012-04-29 DIAGNOSIS — R000 Tachycardia, unspecified: Secondary | ICD-10-CM

## 2012-04-29 LAB — COMPREHENSIVE METABOLIC PANEL WITH GFR
Albumin: 4 g/dL (ref 3.5–5.2)
CO2: 27 meq/L (ref 19–32)
Chloride: 104 meq/L (ref 96–112)
Glucose, Bld: 76 mg/dL (ref 70–99)
Potassium: 4 meq/L (ref 3.5–5.3)
Sodium: 139 meq/L (ref 135–145)
Total Protein: 6.1 g/dL (ref 6.0–8.3)

## 2012-04-29 LAB — COMPREHENSIVE METABOLIC PANEL
ALT: 11 U/L (ref 0–35)
AST: 10 U/L (ref 0–37)
Alkaline Phosphatase: 88 U/L (ref 39–117)
BUN: 16 mg/dL (ref 6–23)
Calcium: 9.5 mg/dL (ref 8.4–10.5)
Creat: 0.69 mg/dL (ref 0.50–1.10)
Total Bilirubin: 0.3 mg/dL (ref 0.3–1.2)

## 2012-04-29 LAB — CBC
HCT: 34.7 % — ABNORMAL LOW (ref 36.0–46.0)
Hemoglobin: 11.6 g/dL — ABNORMAL LOW (ref 12.0–15.0)
MCH: 26.7 pg (ref 26.0–34.0)
MCHC: 33.4 g/dL (ref 30.0–36.0)
MCV: 79.8 fL (ref 78.0–100.0)
Platelets: 249 10*3/uL (ref 150–400)
RBC: 4.35 MIL/uL (ref 3.87–5.11)
RDW: 14.9 % (ref 11.5–15.5)
WBC: 9.8 K/uL (ref 4.0–10.5)

## 2012-04-29 LAB — TSH: TSH: 5.042 u[IU]/mL — ABNORMAL HIGH (ref 0.350–4.500)

## 2012-04-29 LAB — T4, FREE: Free T4: 1.09 ng/dL (ref 0.80–1.80)

## 2012-04-29 LAB — T3, FREE: T3, Free: 3.3 pg/mL (ref 2.3–4.2)

## 2012-04-29 LAB — CORTISOL: Cortisol, Plasma: 0.6 ug/dL

## 2012-04-29 NOTE — Progress Notes (Signed)
Subjective:    CC: Followup  HPI: Yolanda Mosley comes back for episodes of tachycardia, sweating, and shortness of breath that occurred randomly. She was somewhat tachycardic at the last visit and we placed her on atenolol. This was also done to help with her panic attacks. She did go to the hospital since I've seen her last, or she was ruled out for MI, and had essentially normal EKG and blood work. Overall she feels better. Again, she notes episodes of tachycardia, sweating, and flushing. Denies any current chest pain, shortness of breath, or lower extremity swelling. Symptoms are moderate, she's not had any adverse effects from the atenolol  Past medical history, Surgical history, Family history, Social history, Allergies, and medications have been entered into the medical record, reviewed, and no changes needed.   Review of Systems: No fevers, chills, night sweats, weight loss, chest pain, or shortness of breath.   Objective:    General: Well Developed, well nourished, and in no acute distress.  Neuro: Alert and oriented x3, extra-ocular muscles intact.  HEENT: Normocephalic, atraumatic, pupils equal round reactive to light, neck supple, no masses, no lymphadenopathy, thyroid nonpalpable.  Skin: Warm and dry, no rashes. Cardiac: Regular rate and rhythm, no murmurs rubs or gallops.  Respiratory: Clear to auscultation bilaterally. Not using accessory muscles, speaking in full sentences.  Impression and Recommendations:

## 2012-04-29 NOTE — Patient Instructions (Signed)
Increase your atenolol to one entire 50 mg tablet daily. Please go down to the labs that we can check some very specific blood work. Come back to see me in 2 weeks.

## 2012-04-29 NOTE — Assessment & Plan Note (Addendum)
Recently went back to the hospital twice with further episodes of chest pain, tachycardia, palpitations, flushing. Cardiac enzymes were negative, potassium was slightly low. Overall doing well with atenolol one half tab. We'll increase to one whole tab, 50 mg daily. I'm also going to check cortisol, 24 hour 5 hydroxyindoleacetic acid levels, as well as urinary metanephrines to help further assess for carcinoid and pheochromocytoma. We will see her back in about a week or 2.   Low cortisol, still awaiting 24 hour 5-hydroxyindoleacetic acid as well as 24 hour urinary metanephrines. We will refer to endocrinology, she does likely need a cosyntropin stim test.

## 2012-04-30 ENCOUNTER — Other Ambulatory Visit: Payer: Self-pay | Admitting: Sports Medicine

## 2012-05-01 NOTE — Addendum Note (Signed)
Addended by: Monica Becton on: 05/01/2012 05:24 PM   Modules accepted: Orders

## 2012-05-09 ENCOUNTER — Encounter: Payer: Self-pay | Admitting: *Deleted

## 2012-05-12 ENCOUNTER — Telehealth: Payer: Self-pay | Admitting: Family Medicine

## 2012-05-12 NOTE — Telephone Encounter (Signed)
Patient advised.

## 2012-05-12 NOTE — Telephone Encounter (Signed)
Please call patient: I got the results back from the endocrinologist evaluation. They felt like she did not have any type of endocrine disorder that was causing her symptoms. If she is still having the episodes of tachycardia then please let me know. We may consider referral for cardiac monitoring for further evaluation. Please ask her how often they happen and how long they last.

## 2012-05-13 ENCOUNTER — Ambulatory Visit: Payer: BC Managed Care – PPO | Admitting: Family Medicine

## 2012-05-19 ENCOUNTER — Encounter: Payer: Self-pay | Admitting: Physician Assistant

## 2012-05-19 ENCOUNTER — Ambulatory Visit (INDEPENDENT_AMBULATORY_CARE_PROVIDER_SITE_OTHER): Payer: BC Managed Care – PPO | Admitting: Physician Assistant

## 2012-05-19 VITALS — BP 133/94 | HR 74 | Temp 97.7°F | Wt 208.0 lb

## 2012-05-19 DIAGNOSIS — J40 Bronchitis, not specified as acute or chronic: Secondary | ICD-10-CM

## 2012-05-19 DIAGNOSIS — R0602 Shortness of breath: Secondary | ICD-10-CM

## 2012-05-19 DIAGNOSIS — R062 Wheezing: Secondary | ICD-10-CM

## 2012-05-19 MED ORDER — PREDNISONE 20 MG PO TABS: 20.0000 mg | ORAL_TABLET | Freq: Two times a day (BID) | ORAL | Status: DC

## 2012-05-19 MED ORDER — AZITHROMYCIN 250 MG PO TABS: ORAL_TABLET | ORAL | Status: DC

## 2012-05-19 NOTE — Patient Instructions (Addendum)
[facility]cka cold care for cough 3 drops on tongue up to three times a day.  Start zpak for 5 days.  Start prednisone for 5 days.   If need nebulizer come to office.   Bronchitis Bronchitis is the body's way of reacting to injury and/or infection (inflammation) of the bronchi. Bronchi are the air tubes that extend from the windpipe into the lungs. If the inflammation becomes severe, it may cause shortness of breath. CAUSES  Inflammation may be caused by:  A virus.  Germs (bacteria).  Dust.  Allergens.  Pollutants and many other irritants. The cells lining the bronchial tree are covered with tiny hairs (cilia). These constantly beat upward, away from the lungs, toward the mouth. This keeps the lungs free of pollutants. When these cells become too irritated and are unable to do their job, mucus begins to develop. This causes the characteristic cough of bronchitis. The cough clears the lungs when the cilia are unable to do their job. Without either of these protective mechanisms, the mucus would settle in the lungs. Then you would develop pneumonia. Smoking is a common cause of bronchitis and can contribute to pneumonia. Stopping this habit is the single most important thing you can do to help yourself. TREATMENT   Your caregiver may prescribe an antibiotic if the cough is caused by bacteria. Also, medicines that open up your airways make it easier to breathe. Your caregiver may also recommend or prescribe an expectorant. It will loosen the mucus to be coughed up. Only take over-the-counter or prescription medicines for pain, discomfort, or fever as directed by your caregiver.  Removing whatever causes the problem (smoking, for example) is critical to preventing the problem from getting worse.  Cough suppressants may be prescribed for relief of cough symptoms.  Inhaled medicines may be prescribed to help with symptoms now and to help prevent problems from returning.  For those with recurrent  (chronic) bronchitis, there may be a need for steroid medicines. SEEK IMMEDIATE MEDICAL CARE IF:   During treatment, you develop more pus-like mucus (purulent sputum).  You have a fever.  Your baby is older than 3 months with a rectal temperature of 102 F (38.9 C) or higher.  Your baby is 18 months old or younger with a rectal temperature of 100.4 F (38 C) or higher.  You become progressively more ill.  You have increased difficulty breathing, wheezing, or shortness of breath. It is necessary to seek immediate medical care if you are elderly or sick from any other disease. MAKE SURE YOU:   Understand these instructions.  Will watch your condition.  Will get help right away if you are not doing well or get worse. Document Released: 04/16/2005 Document Revised: 07/09/2011 Document Reviewed: 02/24/2008 [facility] Patient Information 2013 [place], [place].

## 2012-05-19 NOTE — Progress Notes (Signed)
  Subjective:    Patient ID: Yolanda Mosley, female    DOB: October 15, 1980, 31 y.o.   MRN: [id number]  HPI Patient is a 32 year old female who presents to the clinic today with ongoing cough and chest congestion. She was diagnosed with the flu at the emergency room on 05/12/11. Chest x-ray was done at emergency room and was negative or any pneumonia. She was given Tamiflu and albuterol inhaler. She has felt some better and fever has resolved. She did finish Tamiflu but was unable to fill albuterol every 2 cost. She reports that her chest is very tight, sore throat, ear pain. She does have an appetite but when she eats feels very nauseated. When she cough is very productive and green in color. She denies any chills or muscle aches. She did have episode of loose stools 2 days ago and that has since resolved. Patient has tried Hycodan and Delsym for cough without any relief.   Review of Systems     Objective:   Physical Exam  Constitutional: She is oriented to person, place, and time. She appears well-developed and well-nourished.  HENT:  Head: Normocephalic and atraumatic.  Right Ear: External ear normal.  Left Ear: External ear normal.  Nose: Nose normal.       TMs are clear bilaterally.  Tonsils are enlarged but without exudate in the oropharynx.  There is mild tenderness over the maxillary sinuses to palpation.  Eyes: Conjunctivae normal are normal.  Neck: Normal range of motion. Neck supple.       Bilateral cervical adenopathy without tenderness to palpation.  Cardiovascular: Normal rate, regular rhythm and normal heart sounds.   Pulmonary/Chest: Effort normal and breath sounds normal.       Mild inspiratory wheezing heard at the base of both lungs.  Lymphadenopathy:    She has cervical adenopathy.  Neurological: She is alert and oriented to person, place, and time.  Skin: Skin is warm and dry.       Cheeks are flushed bilaterally  Psychiatric: She has a normal mood and affect. Her behavior  is normal.          Assessment & Plan:  Bronchitis/wheezing/cough-gave nebulizer treatment of albuterol in office. Reassured patient that I thought there was lots of inflammation captured in lungs. Patient was given prednisone for 5 days as well as the past to cover any bacteria in lungs. Patient cannot afford albuterol inhaler the office visit are covered under insurance. Encouraged patient to come back to office tomorrow is still in feeling tight and to consider another nebulizer treatment. Encouraged patient to the immune system with vitamin C and good handwashing technique. For cough encouraged umcka cold care.

## 2012-05-21 ENCOUNTER — Ambulatory Visit (INDEPENDENT_AMBULATORY_CARE_PROVIDER_SITE_OTHER): Payer: BC Managed Care – PPO | Admitting: Physician Assistant

## 2012-05-21 ENCOUNTER — Encounter: Payer: Self-pay | Admitting: Physician Assistant

## 2012-05-21 VITALS — BP 142/93 | HR 71 | Wt 211.0 lb

## 2012-05-21 DIAGNOSIS — J45909 Unspecified asthma, uncomplicated: Secondary | ICD-10-CM

## 2012-05-21 NOTE — Progress Notes (Signed)
  Subjective:    Patient ID: Yolanda Mosley, female    DOB: 1981/03/02, 32 y.o.   MRN: [id number]  HPI Patient is comes in today with continual chest tightness and shortness of breath. Pt was seen in clinic on Monday and given prednisone for 5 days, zpak for 5 days and told to get albuterol inhaler filled. Pt is not able to afford albuterol inhaler and has not been using it. She felt like she was getting better until last night. Her chest was so tight and she started having a continual dry cough. She took a puff of friends albuterol and did help. She denies any fever, chills, sinus pressure, ST, ear pain.   She has not taken BP meds today. Denies any CP, palpitations, HA's, numbness and tingling.   Review of Systems     Objective:   Physical Exam  Constitutional: She is oriented to person, place, and time. She appears well-developed and well-nourished.  HENT:  Head: Normocephalic and atraumatic.  Cardiovascular: Normal rate, regular rhythm and normal heart sounds.   Pulmonary/Chest: Effort normal and breath sounds normal.       Coarse breath sounds. Minimal wheezing inspiratory at apex of lungs bilaterally.  Neurological: She is alert and oriented to person, place, and time.  Skin: Skin is warm and dry.  Psychiatric: She has a normal mood and affect. Her behavior is normal.          Assessment & Plan:  Bronchitis, asthmatic- Reassured pt that I felt there was a lot of inflammation in chest. Finish both steroid and abx. Gave samples of ventolin and Qvar. Ventolin to use every 6 hours as needed for SOB/chest tightess. Qvar to use twice a day 1-2 puffs for 2 weeks. My hope is at 2 weeks to feel much better and to not be using rescue inhaler at all. If continuing to use rescue inhaler then need to consider going on daily ICS. Follow up as needed.

## 2012-05-22 ENCOUNTER — Other Ambulatory Visit: Payer: Self-pay | Admitting: *Deleted

## 2012-05-22 DIAGNOSIS — R000 Tachycardia, unspecified: Secondary | ICD-10-CM

## 2012-05-22 MED ORDER — ATENOLOL 50 MG PO TABS: 50.0000 mg | ORAL_TABLET | Freq: Every day | ORAL | Status: DC

## 2012-06-03 ENCOUNTER — Ambulatory Visit (INDEPENDENT_AMBULATORY_CARE_PROVIDER_SITE_OTHER): Payer: BC Managed Care – PPO | Admitting: Sports Medicine

## 2012-06-03 ENCOUNTER — Encounter: Payer: Self-pay | Admitting: Sports Medicine

## 2012-06-03 VITALS — BP 118/79 | HR 76 | Temp 97.8°F | Wt 211.0 lb

## 2012-06-03 DIAGNOSIS — R000 Tachycardia, unspecified: Secondary | ICD-10-CM

## 2012-06-03 DIAGNOSIS — J309 Allergic rhinitis, unspecified: Secondary | ICD-10-CM

## 2012-06-03 MED ORDER — FLUTICASONE PROPIONATE 50 MCG/ACT NA SUSP: NASAL | Status: DC

## 2012-06-03 NOTE — Progress Notes (Signed)
Subjective:    CC: Follow up.  HPI:  Sick:  Yolanda Mosley comes back still "sick."  She says symptoms for approximately 3 weeks, predominately consisting of nasal discharge, cough productive of clear sputum, no constitutional symptoms, mild sore throat, and hoarseness. She was seen a couple weeks back, treated with azithromycin and prednisone and improved. She still has some symptoms of nasal discharge, sore throat, and hoarseness.  Tachycardia: Endocrinologic workup has been negative thus far, normal VMA and 5 HIAA levels, low serum cortisol was likely due to suppression for a recent Decadron dose. Tachycardia is significantly improved with atenolol 50 mg daily.  Past medical history, Surgical history, Family history not pertinant except as noted below, Social history, Allergies, and medications have been entered into the medical record, reviewed, and no changes needed.   Review of Systems: No fevers, chills, night sweats, weight loss, chest pain, or shortness of breath.   Objective:    General: Well Developed, well nourished, and in no acute distress.  Neuro: Alert and oriented x3, extra-ocular muscles intact, sensation grossly intact.  HEENT: Normocephalic, atraumatic, pupils equal round reactive to light, neck supple, no masses, no lymphadenopathy, thyroid nonpalpable. Nasal turbinates are boggy and erythematous, external ear canals and oral exam is unremarkable. Skin: Warm and dry, no rashes. Cardiac: Regular rate and rhythm, no murmurs rubs or gallops.  Respiratory: Clear to auscultation bilaterally. Not using accessory muscles, speaking in full sentences.  Impression and Recommendations:

## 2012-06-03 NOTE — Assessment & Plan Note (Signed)
Has had symptoms of rhinorrhea, cough productive of phlegm, sore throat, hoarseness for about a month now. She is already been through prednisone, azithromycin. I think her symptoms are predominantly related to rhinitis, and a nasal steroid use chronically will be tremendously efficacious especially considering the degree of erythema, and swelling of her turbinates.

## 2012-06-03 NOTE — Assessment & Plan Note (Signed)
Very well controlled with no further episodes on atenolol 50 mg daily. She did have a visit with the endocrinologist, her low cortisol level was likely related to prior Decadron treatment. 5-hydroxyindoleacetic acid and urinary metanephrine levels were normal over the 24-hour collection.

## 2012-06-13 ENCOUNTER — Other Ambulatory Visit: Payer: Self-pay | Admitting: *Deleted

## 2012-06-13 DIAGNOSIS — R000 Tachycardia, unspecified: Secondary | ICD-10-CM

## 2012-06-13 MED ORDER — ATENOLOL 50 MG PO TABS: 50.0000 mg | ORAL_TABLET | Freq: Every day | ORAL | Status: DC

## 2012-06-13 NOTE — Telephone Encounter (Signed)
Med refilled for 90 days.

## 2012-06-17 ENCOUNTER — Ambulatory Visit (INDEPENDENT_AMBULATORY_CARE_PROVIDER_SITE_OTHER): Payer: BC Managed Care – PPO | Admitting: Sports Medicine

## 2012-06-17 ENCOUNTER — Other Ambulatory Visit: Payer: Self-pay | Admitting: *Deleted

## 2012-06-17 ENCOUNTER — Encounter: Payer: Self-pay | Admitting: Sports Medicine

## 2012-06-17 VITALS — BP 124/85 | HR 76 | Wt 219.0 lb

## 2012-06-17 DIAGNOSIS — R000 Tachycardia, unspecified: Secondary | ICD-10-CM

## 2012-06-17 DIAGNOSIS — I10 Essential (primary) hypertension: Secondary | ICD-10-CM

## 2012-06-17 MED ORDER — ATENOLOL 50 MG PO TABS: 50.0000 mg | ORAL_TABLET | Freq: Every day | ORAL | Status: DC

## 2012-06-17 NOTE — Progress Notes (Signed)
Subjective:    CC: Chest pain  HPI: Yolanda Mosley is a 32 year old female with existing anxiety disorder who comes in with a several day 2 week history of on and off pain she localizes in the left side of her upper chest. The pain is localized and not associated with exertion, radiates to the back, worse with palpation, she denies any trauma or recent overuse. Denies any associated shortness of breath, or presyncope. When she gets the episodes of chest pain she is somewhat stressed out, and her blood pressure is also somewhat elevated. She's been on atenolol 50 mg daily which is controlled her blood pressure and heart rate very well, she does desire to go back on the enalapril/HCTZ as she feels that this medicine improved her anxiety.  She does tend to cough up phlegm chronically, but denies any sour brash or throat clearing.  Past medical history, Surgical history, Family history not pertinant except as noted below, Social history, Allergies, and medications have been entered into the medical record, reviewed, and no changes needed.   Review of Systems: No fevers, chills, night sweats, weight loss, chest pain, or shortness of breath.   Objective:    General: Well Developed, well nourished, and in no acute distress.  Neuro: Alert and oriented x3, extra-ocular muscles intact, sensation grossly intact.  HEENT: Normocephalic, atraumatic, pupils equal round reactive to light, neck supple, no masses, no lymphadenopathy, thyroid nonpalpable.  Skin: Warm and dry, no rashes. Cardiac: Regular rate and rhythm, no murmurs rubs or gallops.  Respiratory: Clear to auscultation bilaterally. Not using accessory muscles, speaking in full sentences. Impression and Recommendations:

## 2012-06-17 NOTE — Assessment & Plan Note (Addendum)
Per patient request dropping to a half a tablet atenolol, and adding benazepril/HCTZ. We do need to keep laryngopharyngeal reflux disease in the back of her mind.

## 2012-06-17 NOTE — Assessment & Plan Note (Signed)
Per patient request dropping to a half a tablet atenolol, and adding benazepril/HCTZ.

## 2012-06-25 ENCOUNTER — Other Ambulatory Visit: Payer: Self-pay | Admitting: *Deleted

## 2012-06-25 MED ORDER — LISDEXAMFETAMINE DIMESYLATE 70 MG PO CAPS: 70.0000 mg | ORAL_CAPSULE | ORAL | Status: DC

## 2012-07-01 ENCOUNTER — Ambulatory Visit: Payer: BC Managed Care – PPO | Admitting: Sports Medicine

## 2012-07-21 ENCOUNTER — Encounter: Payer: Self-pay | Admitting: Sports Medicine

## 2012-07-21 ENCOUNTER — Ambulatory Visit (INDEPENDENT_AMBULATORY_CARE_PROVIDER_SITE_OTHER): Payer: BC Managed Care – PPO | Admitting: Sports Medicine

## 2012-07-21 VITALS — BP 107/69 | HR 77 | Temp 97.4°F | Wt 221.0 lb

## 2012-07-21 DIAGNOSIS — I10 Essential (primary) hypertension: Secondary | ICD-10-CM

## 2012-07-21 DIAGNOSIS — F988 Other specified behavioral and emotional disorders with onset usually occurring in childhood and adolescence: Secondary | ICD-10-CM

## 2012-07-21 DIAGNOSIS — H6091 Unspecified otitis externa, right ear: Secondary | ICD-10-CM | POA: Insufficient documentation

## 2012-07-21 DIAGNOSIS — J020 Streptococcal pharyngitis: Secondary | ICD-10-CM

## 2012-07-21 DIAGNOSIS — J029 Acute pharyngitis, unspecified: Secondary | ICD-10-CM

## 2012-07-21 DIAGNOSIS — H60399 Other infective otitis externa, unspecified ear: Secondary | ICD-10-CM

## 2012-07-21 LAB — POCT RAPID STREP A (OFFICE): Rapid Strep A Screen: NEGATIVE

## 2012-07-21 MED ORDER — LISDEXAMFETAMINE DIMESYLATE 70 MG PO CAPS: 70.0000 mg | ORAL_CAPSULE | ORAL | Status: DC

## 2012-07-21 MED ORDER — CIPROFLOXACIN-DEXAMETHASONE 0.3-0.1 % OT SUSP: 4.0000 [drp] | Freq: Two times a day (BID) | OTIC | Status: AC

## 2012-07-21 MED ORDER — NAPROXEN 500 MG PO TABS: 500.0000 mg | ORAL_TABLET | Freq: Two times a day (BID) | ORAL | Status: DC

## 2012-07-21 NOTE — Assessment & Plan Note (Signed)
Refilling by Vyvanse.

## 2012-07-21 NOTE — Assessment & Plan Note (Signed)
Ciprodex, return as needed. Naproxen for symptoms.

## 2012-07-21 NOTE — Progress Notes (Signed)
Subjective:    CC: Headache, ear ache, runny nose, sore throat  HPI: This is a pleasant 31 year-old woman who presents with headache, bilateral otalgia, rhinorrhea, and sore throat for four days. She has taken Coricidin Cold and Flu and Sudafed with fair symptomatic improvement. She takes Allegra daily for allergies. She does not have a cough, sinus pressure, or sinus pain.  She works at a Administrator, sports with numerous sick contacts.  Past medical history, Surgical history, Family history not pertinant except as noted below, Social history, Allergies, and medications have been entered into the medical record, reviewed, and no changes needed.   Review of Systems: No fevers, chills, night sweats, weight loss, chest pain, or shortness of breath.   Objective:    POCT Rapid Strep: negative  General: Well Developed, well nourished, and in no acute distress.  Neuro: Alert and oriented x3, extra-ocular muscles intact, sensation grossly intact.  HEENT: Normocephalic, atraumatic, pupils equal round reactive to light, neck supple, tender tonsillar lymphadenopathy, thyroid nonpalpable, mildly erythematous posterior oropharynx, tonsils swollen bilaterally without exudate, TMs normal bilaterally, erythematous right external auditory canal, no sinus tenderness to palpation.  Skin: Warm and dry, no rashes. Cardiac: Regular rate and rhythm, no murmurs rubs or gallops.  Respiratory: Clear to auscultation bilaterally, no wheezes or crackles. Not using accessory muscles, speaking in full sentences. Impression and Recommendations:    I was present for all essential parts of this visit and procedure. Ihor Austin. Benjamin Stain, M.D.

## 2012-07-21 NOTE — Assessment & Plan Note (Signed)
Well controlled, no changes 

## 2012-07-30 ENCOUNTER — Encounter: Payer: Self-pay | Admitting: Family Medicine

## 2012-07-30 ENCOUNTER — Ambulatory Visit (INDEPENDENT_AMBULATORY_CARE_PROVIDER_SITE_OTHER): Payer: BC Managed Care – PPO | Admitting: Family Medicine

## 2012-07-30 VITALS — BP 101/66 | HR 76 | Temp 97.4°F | Ht 64.0 in | Wt 214.0 lb

## 2012-07-30 DIAGNOSIS — J019 Acute sinusitis, unspecified: Secondary | ICD-10-CM

## 2012-07-30 MED ORDER — AMOXICILLIN-POT CLAVULANATE 875-125 MG PO TABS: 1.0000 | ORAL_TABLET | Freq: Two times a day (BID) | ORAL | Status: DC

## 2012-07-30 NOTE — Progress Notes (Signed)
  Subjective:    Patient ID: Yolanda Mosley, female    DOB: Dec 19, 1980, 32 y.o.   MRN: [id number]  HPI HA, ST, cough started about 2 weeks ago.  Saw my partner and dx with external OM. Neg for strep.  Says only got better for 2 days and then got worse again. Sxs are similar but worse.  No SOB but is having some CP.  Painful to swallow.  No swollen glands.  Right ear is better and now the left ear is hurting.  + runny nose.  Can't sleep.  No fever.  Taking sinus cold nad allergy (bendadryl).  Helpes some but as soon as wears off has to take more.    Review of Systems     Objective:   Physical Exam  Constitutional: She is oriented to person, place, and time. She appears well-developed and well-nourished.  HENT:  Head: Normocephalic and atraumatic.  Right Ear: External ear normal.  Left Ear: External ear normal.  Nose: Nose normal.  Mouth/Throat: Oropharynx is clear and moist.  TMs and canals are clear. Left nasal turbinate is swollen.   Eyes: Conjunctivae and EOM are normal. Pupils are equal, round, and reactive to light.  Neck: Neck supple. No thyromegaly present.  Tender on the right side of neck.   Cardiovascular: Normal rate, regular rhythm and normal heart sounds.   Pulmonary/Chest: Effort normal and breath sounds normal. She has no wheezes.  Lymphadenopathy:    She has no cervical adenopathy.  Neurological: She is alert and oriented to person, place, and time.  Skin: Skin is warm and dry.  Psychiatric: She has a normal mood and affect.          Assessment & Plan:  Sinusitis - Sxs x 3 weeks.  Will tx with augmentin x 10 days. Call if not better in a week. Coninue sxs care. Continue allegra as well.   Otitis externa, right-resolved.

## 2012-08-06 ENCOUNTER — Other Ambulatory Visit: Payer: Self-pay | Admitting: Family Medicine

## 2012-08-06 ENCOUNTER — Telehealth: Payer: Self-pay | Admitting: *Deleted

## 2012-08-06 MED ORDER — FLUCONAZOLE 150 MG PO TABS: 150.0000 mg | ORAL_TABLET | Freq: Once | ORAL | Status: DC

## 2012-08-06 NOTE — Telephone Encounter (Signed)
Sent to pharmacy 

## 2012-08-06 NOTE — Telephone Encounter (Signed)
Pharmacy has called asking for refill on Diflucan. States pt is telling them she has gotten a yeast infection from the abx she is on. Please advise if it can be refilled.

## 2012-08-14 ENCOUNTER — Other Ambulatory Visit: Payer: Self-pay | Admitting: Family Medicine

## 2012-08-22 ENCOUNTER — Other Ambulatory Visit: Payer: Self-pay | Admitting: Physician Assistant

## 2012-08-26 ENCOUNTER — Other Ambulatory Visit: Payer: Self-pay | Admitting: *Deleted

## 2012-08-26 MED ORDER — LISDEXAMFETAMINE DIMESYLATE 70 MG PO CAPS: 70.0000 mg | ORAL_CAPSULE | ORAL | Status: DC

## 2012-09-12 ENCOUNTER — Ambulatory Visit (INDEPENDENT_AMBULATORY_CARE_PROVIDER_SITE_OTHER): Payer: BC Managed Care – PPO | Admitting: Family Medicine

## 2012-09-12 ENCOUNTER — Encounter: Payer: Self-pay | Admitting: Family Medicine

## 2012-09-12 VITALS — BP 108/67 | HR 80 | Wt 219.0 lb

## 2012-09-12 DIAGNOSIS — I10 Essential (primary) hypertension: Secondary | ICD-10-CM

## 2012-09-12 DIAGNOSIS — F988 Other specified behavioral and emotional disorders with onset usually occurring in childhood and adolescence: Secondary | ICD-10-CM

## 2012-09-12 LAB — BASIC METABOLIC PANEL WITH GFR
CO2: 29 mEq/L (ref 19–32)
Glucose, Bld: 84 mg/dL (ref 70–99)
Potassium: 3.5 mEq/L (ref 3.5–5.3)
Sodium: 138 mEq/L (ref 135–145)

## 2012-09-12 MED ORDER — LISDEXAMFETAMINE DIMESYLATE 70 MG PO CAPS: 70.0000 mg | ORAL_CAPSULE | ORAL | Status: DC

## 2012-09-12 NOTE — Progress Notes (Signed)
  Subjective:    Patient ID: Yolanda Mosley, female    DOB: Apr 24, 1981, 31 y.o.   MRN: [id number]  HPI ADD - happy with current regimen.  Says more expensive. No SE on the drug.  No insomnia.  No CP or SOB.  Says skipping some meals.  Ok skips lunch.  Weight is stable.    HTN -  Pt denies chest pain, SOB, dizziness, or heart palpitations.  Taking meds as directed w/o problems.  Denies medication side effects.  Taking half tab on both tabs and it is working well. Tachycardia is much improved adn under control.   Review of Systems     Objective:   Physical Exam  Constitutional: She is oriented to person, place, and time. She appears well-developed and well-nourished.  HENT:  Head: Normocephalic and atraumatic.  Cardiovascular: Normal rate, regular rhythm and normal heart sounds.   Pulmonary/Chest: Effort normal and breath sounds normal.  Neurological: She is alert and oriented to person, place, and time.  Skin: Skin is warm and dry.  Psychiatric: She has a normal mood and affect. Her behavior is normal.          Assessment & Plan:  ADD - WEll controlled on current regimen. BP well controlled.  For now she would like to continue with the Vyvanse the Price will go up in June with her insurance also virtual have to pay $160 per month. We'll try to look for coupon card for her but could not find one. She's tolerating it well without any side effects. Followup in 6 months.  HTN - Well controlled on half tab on each meds.  Certainly when she's due for refills of be happy to change her doses so that she doesn't have to cut the tablets in half. Otherwise everything looks great today. Check BMP. Followup in 6 months. We'll do for full blood work at followup in 6 months.

## 2012-09-12 NOTE — Progress Notes (Signed)
Quick Note:  All labs are normal. ______ 

## 2012-09-29 ENCOUNTER — Other Ambulatory Visit: Payer: Self-pay | Admitting: *Deleted

## 2012-09-29 MED ORDER — ACYCLOVIR 200 MG PO CAPS: 200.0000 mg | ORAL_CAPSULE | ORAL | Status: DC

## 2012-09-30 ENCOUNTER — Other Ambulatory Visit: Payer: Self-pay | Admitting: *Deleted

## 2012-09-30 MED ORDER — ACYCLOVIR 200 MG PO CAPS: 200.0000 mg | ORAL_CAPSULE | ORAL | Status: DC

## 2012-10-02 ENCOUNTER — Other Ambulatory Visit: Payer: Self-pay | Admitting: *Deleted

## 2012-10-02 MED ORDER — ACYCLOVIR 400 MG PO TABS: 400.0000 mg | ORAL_TABLET | Freq: Two times a day (BID) | ORAL | Status: DC

## 2012-11-10 ENCOUNTER — Other Ambulatory Visit: Payer: Self-pay | Admitting: Family Medicine

## 2012-11-12 ENCOUNTER — Telehealth: Payer: Self-pay | Admitting: Family Medicine

## 2012-11-12 NOTE — Telephone Encounter (Signed)
Call patient: Received a letter from her insurance company that she has not been filling her blood pressure pill readily. Strongly encouraged her to make sure that she's been consistent with her medication as it does affect her long-term health. If she's having any problems getting her medications and please let us know.

## 2012-11-13 NOTE — Telephone Encounter (Signed)
Pt called back and said that she has been taking her meds she stated that she had came in and seen dr. Benjamin Stain and he had her to cut her tablets in half and since she has 90 day fills she has not had to refill as often.Loralee Pacas [place]

## 2012-11-13 NOTE — Telephone Encounter (Signed)
lvm .Taiwan Talcott [place]  

## 2012-12-04 ENCOUNTER — Other Ambulatory Visit: Payer: Self-pay | Admitting: *Deleted

## 2012-12-04 MED ORDER — LISDEXAMFETAMINE DIMESYLATE 70 MG PO CAPS: 70.0000 mg | ORAL_CAPSULE | ORAL | Status: DC

## 2012-12-18 ENCOUNTER — Other Ambulatory Visit: Payer: Self-pay | Admitting: *Deleted

## 2012-12-18 DIAGNOSIS — I10 Essential (primary) hypertension: Secondary | ICD-10-CM

## 2012-12-18 MED ORDER — BENAZEPRIL-HYDROCHLOROTHIAZIDE 20-25 MG PO TABS: 0.5000 | ORAL_TABLET | Freq: Every day | ORAL | Status: DC

## 2013-01-08 ENCOUNTER — Encounter: Payer: Self-pay | Admitting: Family Medicine

## 2013-01-08 ENCOUNTER — Ambulatory Visit (INDEPENDENT_AMBULATORY_CARE_PROVIDER_SITE_OTHER): Payer: Self-pay | Admitting: Family Medicine

## 2013-01-08 VITALS — BP 116/76 | HR 74 | Temp 97.9°F

## 2013-01-08 DIAGNOSIS — R42 Dizziness and giddiness: Secondary | ICD-10-CM

## 2013-01-08 DIAGNOSIS — R0789 Other chest pain: Secondary | ICD-10-CM

## 2013-01-08 DIAGNOSIS — R0602 Shortness of breath: Secondary | ICD-10-CM

## 2013-01-08 LAB — D-DIMER, QUANTITATIVE: D-Dimer, Quant: 0.39 ug/mL-FEU (ref 0.00–0.48)

## 2013-01-08 NOTE — Progress Notes (Signed)
Subjective:    Patient ID: Yolanda Mosley, female    DOB: 1980-06-02, 32 y.o.   MRN: [id number]  HPI  Pt got up this am with H/A, dizziness, nausea, a little SOB  And some chest tightness, and BP was 142/96. Has taken meds this am. Says has been working different shifts so has been taking meds at different times.  Dizziness is a little better.  Feels hydrated. Got 6 hours of sleep last night.   Hasn't taken her vyvanse today. No URI or cough. No fver. No decongestants.  No GERD.  No worsening or alleviating factors for the chest pain. CP is midsternum.  Constant. Pain is 5-6/10 right now.    No alleviating events. Worse when she turns to th eside or if she squats adn then stands up.     Review of Systems  BP 116/76  Pulse 74    No Known Allergies  Past Medical History  Diagnosis Date  . CA - cancer 2005  . Hemorrhoids     laser tx     Past Surgical History  Procedure Laterality Date  . Ovary, bilater fallopian tubes removed  2005    History   Social History  . Marital Status: Single    Spouse Name: N/A    Number of Children: N/A  . Years of Education: N/A   Occupational History  . Not on file.   Social History Main Topics  . Smoking status: Never Smoker   . Smokeless tobacco: Not on file  . Alcohol Use: Yes     Comment: occasionally  . Drug Use: No  . Sexual Activity:    Other Topics Concern  . Not on file   Social History Narrative  . No narrative on file    Family History  Problem Relation Age of Onset  . Hypertension Mother   . Diabetes Other   . Other Other     cardiovascular disorder    Outpatient Encounter Prescriptions as of 01/08/2013  Medication Sig Dispense Refill  . atenolol (TENORMIN) 50 MG tablet Take 0.5 tablets (25 mg total) by mouth daily.  90 tablet  0  . benazepril-hydrochlorthiazide (LOTENSIN HCT) 20-25 MG per tablet Take 0.5 tablets by mouth daily.  30 tablet  1  . [facility] ALLERGY RELIEF 180 MG tablet TAKE 1 TABLET BY MOUTH EVERY DAY  30  tablet  2  . lisdexamfetamine (VYVANSE) 70 MG capsule Take 1 capsule (70 mg total) by mouth every morning.  30 capsule  0   No facility-administered encounter medications on file as of 01/08/2013.          Objective:   Physical Exam  Constitutional: She is oriented to person, place, and time. She appears well-developed and well-nourished.  HENT:  Head: Normocephalic and atraumatic.  Right Ear: External ear normal.  Left Ear: External ear normal.  Nose: Nose normal.  Mouth/Throat: Oropharynx is clear and moist.  TMs and canals are clear.   Eyes: Conjunctivae and EOM are normal. Pupils are equal, round, and reactive to light.  Neck: Neck supple. No thyromegaly present.  Cardiovascular: Normal rate, regular rhythm and normal heart sounds.   Pulmonary/Chest: Effort normal and breath sounds normal. She has no wheezes.  Musculoskeletal: She exhibits no edema.  Lymphadenopathy:    She has no cervical adenopathy.  Neurological: She is alert and oriented to person, place, and time.  Skin: Skin is warm and dry.  Psychiatric: She has a normal mood and affect. Her behavior is  normal.          Assessment & Plan:  Atypical chest pain - unclear etiology at this point. I will do an EKG today as well as get a chest x-ray since she also has short of breath with the chest pain. We'll also get cardiac enzymes TSH and a CBC. Fortunately, she has not taken her stimulant today and I recommended she not take it until she's feeling better. Her blood pressure looks absolutely fantastic today. EKG shows rate of 61 beats per minute with normal sinus rhythm and normal axis. Inverted T wave in lead 3 and aVF. Will check d-dimer to r/o PE though she is otherwise low risk. No recent travel, birth control, Mel[place] Banker. Also consider this could be stress related as her shift has been changing very frequently at work and she has not been really getting adequate sleep.  Shortness of breath-will get chest x-ray and  check CBC for anemia. She's never been a smoker. Will check d-dimer.  Afebrile today.    Dizziness - may be related to SOB and chest pain.  She says this is actually better.

## 2013-01-09 ENCOUNTER — Ambulatory Visit: Payer: Self-pay | Admitting: Family Medicine

## 2013-01-09 LAB — COMPLETE METABOLIC PANEL WITH GFR
ALT: 15 U/L (ref 0–35)
AST: 17 U/L (ref 0–37)
Calcium: 9.5 mg/dL (ref 8.4–10.5)
Chloride: 101 mEq/L (ref 96–112)
Creat: 0.66 mg/dL (ref 0.50–1.10)
Total Bilirubin: 0.2 mg/dL — ABNORMAL LOW (ref 0.3–1.2)

## 2013-01-09 LAB — CBC
Platelets: 267 10*3/uL (ref 150–400)
RBC: 4.36 MIL/uL (ref 3.87–5.11)
WBC: 8.1 10*3/uL (ref 4.0–10.5)

## 2013-01-09 LAB — TSH: TSH: 2.067 u[IU]/mL (ref 0.350–4.500)

## 2013-01-09 LAB — CK TOTAL AND CKMB (NOT AT ARMC): CK, MB: 2.7 ng/mL (ref 0.3–4.0)

## 2013-01-13 ENCOUNTER — Other Ambulatory Visit: Payer: Self-pay | Admitting: *Deleted

## 2013-01-13 ENCOUNTER — Encounter: Payer: Self-pay | Admitting: *Deleted

## 2013-01-13 MED ORDER — LISDEXAMFETAMINE DIMESYLATE 70 MG PO CAPS: 70.0000 mg | ORAL_CAPSULE | ORAL | Status: DC

## 2013-02-12 ENCOUNTER — Other Ambulatory Visit: Payer: Self-pay | Admitting: *Deleted

## 2013-02-12 DIAGNOSIS — I10 Essential (primary) hypertension: Secondary | ICD-10-CM

## 2013-02-12 MED ORDER — BENAZEPRIL-HYDROCHLOROTHIAZIDE 20-25 MG PO TABS: 0.5000 | ORAL_TABLET | Freq: Every day | ORAL | Status: DC

## 2013-02-16 ENCOUNTER — Other Ambulatory Visit: Payer: Self-pay | Admitting: Family Medicine

## 2013-02-17 ENCOUNTER — Other Ambulatory Visit: Payer: Self-pay | Admitting: *Deleted

## 2013-02-17 MED ORDER — BENAZEPRIL-HYDROCHLOROTHIAZIDE 10-12.5 MG PO TABS: ORAL_TABLET | ORAL | Status: DC

## 2013-03-09 ENCOUNTER — Other Ambulatory Visit: Payer: Self-pay

## 2013-03-09 MED ORDER — LISDEXAMFETAMINE DIMESYLATE 70 MG PO CAPS: 70.0000 mg | ORAL_CAPSULE | ORAL | Status: DC

## 2013-03-10 ENCOUNTER — Telehealth: Payer: Self-pay | Admitting: *Deleted

## 2013-03-10 NOTE — Telephone Encounter (Signed)
Pt lvm stating that a PA is needed for vyvanse wanted to know if this has been taken care of?Laureen Ochs, Viann Shove

## 2013-03-11 ENCOUNTER — Ambulatory Visit (INDEPENDENT_AMBULATORY_CARE_PROVIDER_SITE_OTHER): Payer: Self-pay | Admitting: Family Medicine

## 2013-03-11 ENCOUNTER — Encounter: Payer: Self-pay | Admitting: Family Medicine

## 2013-03-11 VITALS — BP 125/75 | HR 83 | Wt 221.0 lb

## 2013-03-11 DIAGNOSIS — F988 Other specified behavioral and emotional disorders with onset usually occurring in childhood and adolescence: Secondary | ICD-10-CM

## 2013-03-11 NOTE — Telephone Encounter (Signed)
PA initiated. Waiting on response from insurance company.  Meyer Cory, LPN

## 2013-03-11 NOTE — Progress Notes (Addendum)
  Subjective:    Patient ID: Yolanda Mosley, female    DOB: [birth year], 32 y.o.   MRN: [id number]  HPI Here to F/U on ADD. No CP or SOB.  CP resolved.  Out of meds for 6 days.  Sleeping well when take.  No appetitie supression.  Feels happy with dosing regimen.  Her pharamcy says they sent over PA on Monday. Has been on same med for years but they do require PA periodically.   HTN-  Pt denies chest pain, SOB, dizziness, or heart palpitations.  Taking meds as directed w/o problems.  Denies medication side effects.    Review of Systems     Objective:   Physical Exam  Constitutional: She is oriented to person, place, and time. She appears well-developed and well-nourished.  HENT:  Head: Normocephalic and atraumatic.  Neck: Neck supple. No thyromegaly present.  Cardiovascular: Normal rate, regular rhythm and normal heart sounds.   Pulmonary/Chest: Effort normal and breath sounds normal.  Lymphadenopathy:    She has no cervical adenopathy.  Neurological: She is alert and oriented to person, place, and time.  Skin: Skin is warm and dry.  Psychiatric: She has a normal mood and affect. Her behavior is normal.          Assessment & Plan:  ADD- doing well overall.  Continue current regimen. Will call to initiate PA. We had not received anything in our office. F/U in 6 mon.   HTN- well controlled on current regimen. F/u in 6 mo.

## 2013-03-12 ENCOUNTER — Other Ambulatory Visit: Payer: Self-pay | Admitting: Family Medicine

## 2013-03-13 ENCOUNTER — Telehealth: Payer: Self-pay | Admitting: *Deleted

## 2013-03-13 NOTE — Telephone Encounter (Signed)
PA approved for Vyvanse through [facility]. 03/12/13-03/12/16.

## 2013-03-16 ENCOUNTER — Ambulatory Visit: Payer: BC Managed Care – PPO | Admitting: Family Medicine

## 2013-03-17 ENCOUNTER — Ambulatory Visit: Payer: BC Managed Care – PPO | Admitting: Family Medicine

## 2013-04-10 ENCOUNTER — Other Ambulatory Visit: Payer: Self-pay | Admitting: *Deleted

## 2013-04-10 MED ORDER — LISDEXAMFETAMINE DIMESYLATE 70 MG PO CAPS: 70.0000 mg | ORAL_CAPSULE | ORAL | Status: DC

## 2013-04-16 ENCOUNTER — Other Ambulatory Visit: Payer: Self-pay | Admitting: Family Medicine

## 2013-05-14 ENCOUNTER — Other Ambulatory Visit: Payer: Self-pay | Admitting: Family Medicine

## 2013-05-14 ENCOUNTER — Telehealth: Payer: Self-pay | Admitting: *Deleted

## 2013-05-14 MED ORDER — LISDEXAMFETAMINE DIMESYLATE 70 MG PO CAPS: 70.0000 mg | ORAL_CAPSULE | ORAL | Status: DC

## 2013-05-14 NOTE — Telephone Encounter (Signed)
Pt called for refill of vyvanse. Return in about 6 months (around 09/08/2013) for blood pressure and ADD.

## 2013-06-07 ENCOUNTER — Other Ambulatory Visit: Payer: Self-pay | Admitting: Family Medicine

## 2013-06-19 ENCOUNTER — Other Ambulatory Visit: Payer: Self-pay | Admitting: *Deleted

## 2013-06-19 MED ORDER — LISDEXAMFETAMINE DIMESYLATE 70 MG PO CAPS: 70.0000 mg | ORAL_CAPSULE | ORAL | Status: DC

## 2013-07-28 ENCOUNTER — Other Ambulatory Visit: Payer: Self-pay | Admitting: *Deleted

## 2013-07-28 MED ORDER — LISDEXAMFETAMINE DIMESYLATE 70 MG PO CAPS: 70.0000 mg | ORAL_CAPSULE | ORAL | Status: DC

## 2013-08-11 ENCOUNTER — Other Ambulatory Visit: Payer: Self-pay | Admitting: Family Medicine

## 2013-08-16 ENCOUNTER — Other Ambulatory Visit: Payer: Self-pay | Admitting: Family Medicine

## 2013-08-28 ENCOUNTER — Ambulatory Visit: Payer: Self-pay | Admitting: Family Medicine

## 2013-08-28 DIAGNOSIS — Z0289 Encounter for other administrative examinations: Secondary | ICD-10-CM

## 2013-09-07 ENCOUNTER — Other Ambulatory Visit: Payer: Self-pay | Admitting: *Deleted

## 2013-09-07 MED ORDER — LISDEXAMFETAMINE DIMESYLATE 70 MG PO CAPS: 70.0000 mg | ORAL_CAPSULE | ORAL | Status: DC

## 2013-09-07 NOTE — Telephone Encounter (Signed)
Called and lvm asking that pt schedule an f/u appt .Yolanda Mosley

## 2013-09-09 ENCOUNTER — Ambulatory Visit (INDEPENDENT_AMBULATORY_CARE_PROVIDER_SITE_OTHER): Payer: BC Managed Care – PPO | Admitting: Family Medicine

## 2013-09-09 ENCOUNTER — Encounter: Payer: Self-pay | Admitting: Family Medicine

## 2013-09-09 VITALS — BP 105/72 | HR 72 | Ht 65.0 in | Wt 212.0 lb

## 2013-09-09 DIAGNOSIS — E039 Hypothyroidism, unspecified: Secondary | ICD-10-CM

## 2013-09-09 DIAGNOSIS — I10 Essential (primary) hypertension: Secondary | ICD-10-CM

## 2013-09-09 DIAGNOSIS — D509 Iron deficiency anemia, unspecified: Secondary | ICD-10-CM | POA: Insufficient documentation

## 2013-09-09 DIAGNOSIS — H8110 Benign paroxysmal vertigo, unspecified ear: Secondary | ICD-10-CM

## 2013-09-09 DIAGNOSIS — F988 Other specified behavioral and emotional disorders with onset usually occurring in childhood and adolescence: Secondary | ICD-10-CM

## 2013-09-09 HISTORY — DX: Iron deficiency anemia, unspecified: D50.9

## 2013-09-09 MED ORDER — LISDEXAMFETAMINE DIMESYLATE 70 MG PO CAPS: 70.0000 mg | ORAL_CAPSULE | ORAL | Status: DC

## 2013-09-09 NOTE — Progress Notes (Signed)
   Subjective:    Patient ID: Yolanda Mosley, female    DOB: 1980/09/18, 33 y.o.   MRN: [id number]  HPI Here for followup for her ADD and. Last visit was 6 months ago. She's currently on Vyvanse. She's happy with her current regimen. No chest pain, shortness of breath, palpitations. It's not suppressing her appetite too much. Not keeping her awake at night.  Hypertension- Pt denies chest pain, SOB, dizziness, or heart palpitations.  Taking meds as directed w/o problems.  Denies medication side effects.    Complains of some intermittent dizziness. She notices it particularly when she lays down in bed at night and then tries to try her side. She says she has a lather for a few minutes before she's able to turn over or she gets very dizzy. She also notes that happens when she steps on a step stool at work. It does not seem to happen when she goes up or down stairs. Usually when she steps on the step stool at work she is looking up to put something up overhead. She denies any recent ear pain or pressure or cold symptoms. No head injury. It started several weeks ago. It usually just lasts a couple minutes and then resolves on its own.  Iron deficiency-she's had a lot happen since I last saw her. She saw urologist and has good. She was diagnosed with gastric ulcers and started on Prilosec. She's continued to have low iron and was unable to tolerate oral iron and so has been scheduled for IV iron. They also want her to swallow a camera capsule to take a look at the small bowel to see if she may be bleeding from that area. Review of Systems     Objective:   Physical Exam  Constitutional: She is oriented to person, place, and time. She appears well-developed and well-nourished.  HENT:  Head: Normocephalic and atraumatic.  Eyes: Conjunctivae and EOM are normal. Pupils are equal, round, and reactive to light.  Cardiovascular: Normal rate, regular rhythm and normal heart sounds.   Pulmonary/Chest: Effort normal  and breath sounds normal.  Neurological: She is alert and oriented to person, place, and time.  Dix-Hallpike maneuver is negative for actual nystagmus but it did recreate some of her symptoms when we went to the left.  Skin: Skin is warm and dry.  Psychiatric: She has a normal mood and affect. Her behavior is normal.          Assessment & Plan:  ADD-well-controlled on current regimen. Prescriptions given for 3 months. Followup in 3-4 months.  Hypertension-well-controlled. In that blood pressures actually little bit low today. She says it's mostly been running in the 120s which is reasonable. I did encourage her to keep an eye on the next couple months and if it is low then please let me know and we can adjust her benazepril HCT. She's on atenolol to control heart rate.  Dizziness-most consistent with benign positional vertigo. Reviewed with her exercises on how to correct and improve the condition and have been a response. Encouraged her to do these 10 times, twice a day. If it's not resolving in the next 2-3 weeks and please let me know. Also encouraged to keep an eye on the blood pressure since it is a little bit low today it certainly could be contributing.  Iron deficiency anemia-currently being followed by GI and is scheduled for IV iron infusion through hematology.

## 2013-09-14 ENCOUNTER — Encounter: Payer: Self-pay | Admitting: Family Medicine

## 2013-09-14 DIAGNOSIS — K259 Gastric ulcer, unspecified as acute or chronic, without hemorrhage or perforation: Secondary | ICD-10-CM

## 2013-09-14 HISTORY — DX: Gastric ulcer, unspecified as acute or chronic, without hemorrhage or perforation: K25.9

## 2013-09-15 ENCOUNTER — Other Ambulatory Visit: Payer: Self-pay | Admitting: Family Medicine

## 2013-09-27 ENCOUNTER — Other Ambulatory Visit: Payer: Self-pay | Admitting: Family Medicine

## 2013-10-26 ENCOUNTER — Other Ambulatory Visit: Payer: Self-pay | Admitting: Family Medicine

## 2013-11-09 ENCOUNTER — Other Ambulatory Visit: Payer: Self-pay | Admitting: Family Medicine

## 2013-11-27 ENCOUNTER — Other Ambulatory Visit: Payer: Self-pay | Admitting: Family Medicine

## 2013-12-23 ENCOUNTER — Other Ambulatory Visit: Payer: Self-pay | Admitting: Family Medicine

## 2014-01-19 ENCOUNTER — Other Ambulatory Visit: Payer: Self-pay

## 2014-01-19 MED ORDER — LISDEXAMFETAMINE DIMESYLATE 70 MG PO CAPS: 70.0000 mg | ORAL_CAPSULE | ORAL | Status: DC

## 2014-01-20 ENCOUNTER — Ambulatory Visit (INDEPENDENT_AMBULATORY_CARE_PROVIDER_SITE_OTHER): Payer: BLUE CROSS/BLUE SHIELD | Admitting: Family Medicine

## 2014-01-20 ENCOUNTER — Encounter: Payer: Self-pay | Admitting: Family Medicine

## 2014-01-20 VITALS — BP 112/70 | HR 88 | Wt 216.0 lb

## 2014-01-20 DIAGNOSIS — F988 Other specified behavioral and emotional disorders with onset usually occurring in childhood and adolescence: Secondary | ICD-10-CM

## 2014-01-20 DIAGNOSIS — N83209 Unspecified ovarian cyst, unspecified side: Secondary | ICD-10-CM

## 2014-01-20 DIAGNOSIS — I10 Essential (primary) hypertension: Secondary | ICD-10-CM

## 2014-01-20 DIAGNOSIS — Z8543 Personal history of malignant neoplasm of ovary: Secondary | ICD-10-CM

## 2014-01-20 DIAGNOSIS — R0789 Other chest pain: Secondary | ICD-10-CM | POA: Diagnosis not present

## 2014-01-20 DIAGNOSIS — R0602 Shortness of breath: Secondary | ICD-10-CM

## 2014-01-20 HISTORY — DX: Personal history of malignant neoplasm of ovary: Z85.43

## 2014-01-20 MED ORDER — DROSPIRENONE-ETHINYL ESTRADIOL 3-0.02 MG PO TABS: 1.0000 | ORAL_TABLET | Freq: Every day | ORAL | Status: DC

## 2014-01-20 MED ORDER — LISDEXAMFETAMINE DIMESYLATE 70 MG PO CAPS: 70.0000 mg | ORAL_CAPSULE | ORAL | Status: DC

## 2014-01-20 NOTE — Assessment & Plan Note (Signed)
Well controlled on current regimen. Watch BP carefully.

## 2014-01-20 NOTE — Progress Notes (Signed)
Subjective:    Patient ID: Yolanda Mosley, female    DOB: 08/28/1980, 33 y.o.   MRN: [id number]  Shortness of Breath Associated symptoms include chest pain.  Chest Pain  Associated symptoms include shortness of breath.   Yolanda Mosley reports episodes of shortness of breath, chest tightness and flushing in her face off and on for 2 weeks. The most recent new start of a medication is Junel/BCP 3 months ago. Slight HA and some dizziness as well.  Can last several minutes.  Yesterday BP was 148/100 around the time that she was having symptoms. She says a couple other times she checks her blood pressures and elevated around that time as well. She's been on her current blood pressure medications for the last several years without any adjustments or changes. She denies any recent changes in her diet or increase in salt intake.  Started on OCP  For 3 months to help reduce a cyst on her ovaries.  See. Dr. Polly Cobia.  She says she wants to stop the BP pill if it is raising her BP.    ADD- doing well on vyvanse. Has been on same dose for long time.  No insomnia. No palpitations.    Review of Systems  Respiratory: Positive for shortness of breath.   Cardiovascular: Positive for chest pain.       Objective:   Physical Exam  Constitutional: She is oriented to person, place, and time. She appears well-developed and well-nourished.  HENT:  Head: Normocephalic and atraumatic.  Right Ear: External ear normal.  Left Ear: External ear normal.  Nose: Nose normal.  Mouth/Throat: Oropharynx is clear and moist.  TMs and canals are clear.   Eyes: Conjunctivae and EOM are normal. Pupils are equal, round, and reactive to light.  Neck: Neck supple. No thyromegaly present.  Cardiovascular: Normal rate, regular rhythm and normal heart sounds.   Pulmonary/Chest: Effort normal and breath sounds normal. She has no wheezes.  Lymphadenopathy:    She has no cervical adenopathy.  Neurological: She is alert and oriented to  person, place, and time. She has normal reflexes. No cranial nerve deficit.  Skin: Skin is warm and dry.  Psychiatric: She has a normal mood and affect. Her behavior is normal.          Assessment & Plan:  Hypertension-her blood pressure actually looks great today but has been getting high levels at home when she checks it during these episodes. Certainly the blood pressure could be elevating her pressure. Stress could also be contributing. She denies any major changes in her diet or increase in salt intake. She says she wants to stop the birth control if it's going to raise her blood pressure. We discussed possibly switching to a different birth control. It may or may not make a difference but we could certainly try. We'll switch her to you as and have her followup in one month. Encouraged her to continue to monitor blood pressures at home. When if anything changes. What will do some additional blood work today just to rule out an elevated d-dimer which could be suspect for pulmonary embolism. The she appears in no acute distress today. She's not having chest pain currently or shortness of breath currently.  Ovarian cyst-will switch to [facility] as from [place]. Will send a copy of note to Dr. Genia Del. Explained to her can still elevated BP but we will keep a close yee on it.    ADD-she's been on her current regimen for quite  some time. Did go ahead and refill it today but reminded her to also increase blood pressure initially to keep an eye on this.   Dr. Shelba Flake phone number is [phone].

## 2014-01-21 LAB — CBC WITH DIFFERENTIAL/PLATELET
BASOS PCT: 0 % (ref 0–1)
Basophils Absolute: 0 10*3/uL (ref 0.0–0.1)
EOS ABS: 0.2 10*3/uL (ref 0.0–0.7)
Eosinophils Relative: 3 % (ref 0–5)
HCT: 38 % (ref 36.0–46.0)
HEMOGLOBIN: 13.1 g/dL (ref 12.0–15.0)
Lymphocytes Relative: 38 % (ref 12–46)
Lymphs Abs: 2.4 10*3/uL (ref 0.7–4.0)
MCH: 28.4 pg (ref 26.0–34.0)
MCHC: 34.5 g/dL (ref 30.0–36.0)
MCV: 82.4 fL (ref 78.0–100.0)
Monocytes Absolute: 0.4 10*3/uL (ref 0.1–1.0)
Monocytes Relative: 6 % (ref 3–12)
Neutro Abs: 3.4 10*3/uL (ref 1.7–7.7)
Neutrophils Relative %: 53 % (ref 43–77)
Platelets: 214 10*3/uL (ref 150–400)
RBC: 4.61 MIL/uL (ref 3.87–5.11)
RDW: 13.2 % (ref 11.5–15.5)
WBC: 6.4 10*3/uL (ref 4.0–10.5)

## 2014-01-21 LAB — LIPID PANEL
CHOLESTEROL: 148 mg/dL (ref 0–200)
HDL: 46 mg/dL (ref >39)
LDL Cholesterol: 81 mg/dL (ref 0–99)
TRIGLYCERIDES: 107 mg/dL (ref <150)
Total CHOL/HDL Ratio: 3.2 Ratio
VLDL: 21 mg/dL (ref 0–40)

## 2014-01-21 LAB — COMPLETE METABOLIC PANEL WITH GFR
ALBUMIN: 3.7 g/dL (ref 3.5–5.2)
ALT: 11 U/L (ref 0–35)
AST: 15 U/L (ref 0–37)
Alkaline Phosphatase: 83 U/L (ref 39–117)
BUN: 15 mg/dL (ref 6–23)
CALCIUM: 9.4 mg/dL (ref 8.4–10.5)
CO2: 25 mEq/L (ref 19–32)
CREATININE: 0.66 mg/dL (ref 0.50–1.10)
Chloride: 101 mEq/L (ref 96–112)
GFR, Est African American: >89 mL/min
Glucose, Bld: 84 mg/dL (ref 70–99)
POTASSIUM: 3.5 meq/L (ref 3.5–5.3)
Sodium: 136 mEq/L (ref 135–145)
Total Bilirubin: 0.5 mg/dL (ref 0.2–1.2)
Total Protein: 6.7 g/dL (ref 6.0–8.3)

## 2014-01-21 LAB — D-DIMER, QUANTITATIVE (NOT AT ARMC): D DIMER QUANT: 1.02 ug{FEU}/mL — AB (ref 0.00–0.48)

## 2014-01-21 LAB — TSH
TSH: 3.636 u[IU]/mL (ref 0.350–4.500)
TSH: 3.645 u[IU]/mL (ref 0.350–4.500)

## 2014-01-22 ENCOUNTER — Ambulatory Visit (INDEPENDENT_AMBULATORY_CARE_PROVIDER_SITE_OTHER): Payer: BC Managed Care – PPO

## 2014-01-22 ENCOUNTER — Other Ambulatory Visit: Payer: Self-pay | Admitting: *Deleted

## 2014-01-22 DIAGNOSIS — R0602 Shortness of breath: Secondary | ICD-10-CM

## 2014-01-22 DIAGNOSIS — R7989 Other specified abnormal findings of blood chemistry: Secondary | ICD-10-CM

## 2014-01-22 DIAGNOSIS — R0789 Other chest pain: Secondary | ICD-10-CM

## 2014-01-22 MED ORDER — IOHEXOL 350 MG/ML SOLN
120.0000 mL | Freq: Once | INTRAVENOUS | Status: AC | PRN
  Administered 2014-01-22: 100 mL via INTRAVENOUS

## 2014-01-22 NOTE — Progress Notes (Signed)
Quick Note:  All labs are normal. ______ 

## 2014-01-25 ENCOUNTER — Telehealth: Payer: Self-pay | Admitting: *Deleted

## 2014-01-25 NOTE — Telephone Encounter (Signed)
Pt stated that since there was no blood clot found in her lungs she wanted to know what other reasons would there be for the cause in elevation of the d-dimer. Please advise.Yolanda Mosley

## 2014-01-26 NOTE — Telephone Encounter (Signed)
Inflammation can raise the number and some people just have a high baseline. I have several patients like that.  Has she felt better since bing off the birth control yet?  Also make sure working on lowering stress levels.

## 2014-01-26 NOTE — Telephone Encounter (Signed)
Left message for patient to return call.

## 2014-01-29 ENCOUNTER — Ambulatory Visit: Payer: BC Managed Care – PPO | Admitting: Family Medicine

## 2014-02-04 ENCOUNTER — Ambulatory Visit (INDEPENDENT_AMBULATORY_CARE_PROVIDER_SITE_OTHER): Payer: BLUE CROSS/BLUE SHIELD | Admitting: Sports Medicine

## 2014-02-04 ENCOUNTER — Encounter: Payer: Self-pay | Admitting: Sports Medicine

## 2014-02-04 VITALS — BP 124/86 | HR 90 | Temp 98.3°F | Ht 64.0 in | Wt 209.0 lb

## 2014-02-04 DIAGNOSIS — B349 Viral infection, unspecified: Secondary | ICD-10-CM | POA: Diagnosis not present

## 2014-02-04 MED ORDER — DIPHENOXYLATE-ATROPINE 2.5-0.025 MG PO TABS: ORAL_TABLET | ORAL | Status: DC

## 2014-02-04 MED ORDER — ZICAM COLD REMEDY PO TBDP: 1.0000 | ORAL_TABLET | Freq: Three times a day (TID) | ORAL | Status: DC

## 2014-02-04 MED ORDER — FLUTICASONE PROPIONATE 50 MCG/ACT NA SUSP: 2.0000 | Freq: Two times a day (BID) | NASAL | Status: DC

## 2014-02-04 NOTE — Progress Notes (Signed)
  Subjective:    CC: Sick  HPI: This is a very pleasant 33 year old female, for the last several days she's had sore throat, extensive rhinorrhea, diarrhea without hematochezia, melena, or hematemesis. No muscle aches, no body aches. No cough. Pain is going into the right ear, but she has no sinus pain or pressure. She does work at [employer], and so has multiple sick contacts.  Past medical history, Surgical history, Family history not pertinant except as noted below, Social history, Allergies, and medications have been entered into the medical record, reviewed, and no changes needed.   Review of Systems: No fevers, chills, night sweats, weight loss, chest pain, or shortness of breath.   Objective:    General: Well Developed, well nourished, and in no acute distress.  Neuro: Alert and oriented x3, extra-ocular muscles intact, sensation grossly intact.  HEENT: Normocephalic, atraumatic, pupils equal round reactive to light, neck supple, no masses, no lymphadenopathy, thyroid nonpalpable. Clear rhinorrhea, turbinates are boggy. Oropharynx, external ear canals are unremarkable. Skin: Warm and dry, no rashes. Cardiac: Regular rate and rhythm, no murmurs rubs or gallops, no lower extremity edema.  Respiratory: Clear to auscultation bilaterally. Not using accessory muscles, speaking in full sentences. Abdomen: Soft, nontender, nondistended, normal bowel sounds, no palpable masses.  Impression and Recommendations:

## 2014-02-04 NOTE — Assessment & Plan Note (Signed)
No focal findings. Lomotil, over-the-counter ibuprofen as needed, Flonase for rhinorrhea. Zicam, stay hydrated.

## 2014-02-04 NOTE — Patient Instructions (Signed)
Viral Infections A viral infection can be caused by different types of viruses.Most viral infections are not serious and resolve on their own. However, some infections may cause severe symptoms and may lead to further complications. SYMPTOMS Viruses can frequently cause:  Minor sore throat.  Aches and pains.  Headaches.  Runny nose.  Different types of rashes.  Watery eyes.  Tiredness.  Cough.  Loss of appetite.  Gastrointestinal infections, resulting in nausea, vomiting, and diarrhea. These symptoms do not respond to antibiotics because the infection is not caused by bacteria. However, you might catch a bacterial infection following the viral infection. This is sometimes called a "superinfection." Symptoms of such a bacterial infection may include:  Worsening sore throat with pus and difficulty swallowing.  Swollen neck glands.  Chills and a high or persistent fever.  Severe headache.  Tenderness over the sinuses.  Persistent overall ill feeling (malaise), muscle aches, and tiredness (fatigue).  Persistent cough.  Yellow, green, or brown mucus production with coughing. HOME CARE INSTRUCTIONS   Only take over-the-counter or prescription medicines for pain, discomfort, diarrhea, or fever as directed by your caregiver.  Drink enough water and fluids to keep your urine clear or pale yellow. Sports drinks can provide valuable electrolytes, sugars, and hydration.  Get plenty of rest and maintain proper nutrition. Soups and broths with crackers or rice are fine. SEEK IMMEDIATE MEDICAL CARE IF:   You have severe headaches, shortness of breath, chest pain, neck pain, or an unusual rash.  You have uncontrolled vomiting, diarrhea, or you are unable to keep down fluids.  You or your child has an oral temperature above 102 F (38.9 C), not controlled by medicine.  Your baby is older than 3 months with a rectal temperature of 102 F (38.9 C) or higher.  Your baby is 3  months old or younger with a rectal temperature of 100.4 F (38 C) or higher. MAKE SURE YOU:   Understand these instructions.  Will watch your condition.  Will get help right away if you are not doing well or get worse. Document Released: 01/24/2005 Document Revised: 07/09/2011 Document Reviewed: 08/21/2010 Exit[employer] Patient Information 2015 [employer]. This information is not intended to replace advice given to you by your health care provider. Make sure you discuss any questions you have with your health care provider.  

## 2014-02-10 ENCOUNTER — Telehealth: Payer: Self-pay

## 2014-02-10 NOTE — Telephone Encounter (Signed)
Patient advised to take Pseudoephedrine for the congestion and Aleve for sore throat. Advised patient to call back in the morning if she feels worse. She states she usually needs a antibiotic when her symptoms persist this long.

## 2014-02-10 NOTE — Telephone Encounter (Signed)
Yolanda Mosley still has sore throat, pain in left ear, runny nose, sneezing and headaches. Denies fever, chills or sweats. She would like your recommendation.

## 2014-02-12 ENCOUNTER — Telehealth: Payer: Self-pay

## 2014-02-12 MED ORDER — AMOXICILLIN-POT CLAVULANATE 875-125 MG PO TABS: 1.0000 | ORAL_TABLET | Freq: Two times a day (BID) | ORAL | Status: DC

## 2014-02-12 NOTE — Telephone Encounter (Signed)
Yolanda Mosley called and states she is still sick and would like an antibiotic sent in. She reports sore throat, runny nose and ear pain. Denies fever, chills or sweats.

## 2014-02-12 NOTE — Telephone Encounter (Signed)
Per Dr Madilyn Fireman I sent in Augmentin antibiotic.

## 2014-02-17 ENCOUNTER — Ambulatory Visit: Payer: BC Managed Care – PPO | Admitting: Family Medicine

## 2014-03-12 ENCOUNTER — Other Ambulatory Visit: Payer: Self-pay | Admitting: Family Medicine

## 2014-03-22 ENCOUNTER — Other Ambulatory Visit: Payer: Self-pay | Admitting: Family Medicine

## 2014-03-27 ENCOUNTER — Other Ambulatory Visit: Payer: Self-pay | Admitting: Family Medicine

## 2014-03-30 ENCOUNTER — Other Ambulatory Visit: Payer: Self-pay | Admitting: Family Medicine

## 2014-04-11 ENCOUNTER — Other Ambulatory Visit: Payer: Self-pay | Admitting: Family Medicine

## 2014-04-19 ENCOUNTER — Encounter: Payer: Self-pay | Admitting: Physician Assistant

## 2014-04-19 ENCOUNTER — Ambulatory Visit (INDEPENDENT_AMBULATORY_CARE_PROVIDER_SITE_OTHER): Payer: BC Managed Care – PPO | Admitting: Physician Assistant

## 2014-04-19 VITALS — BP 121/72 | HR 102 | Temp 97.7°F | Ht 64.0 in | Wt 206.0 lb

## 2014-04-19 DIAGNOSIS — J069 Acute upper respiratory infection, unspecified: Secondary | ICD-10-CM

## 2014-04-19 MED ORDER — HYDROCODONE-HOMATROPINE 5-1.5 MG/5ML PO SYRP: 5.0000 mL | ORAL_SOLUTION | Freq: Every evening | ORAL | Status: DC | PRN

## 2014-04-19 MED ORDER — AMOXICILLIN-POT CLAVULANATE 875-125 MG PO TABS: 1.0000 | ORAL_TABLET | Freq: Two times a day (BID) | ORAL | Status: DC

## 2014-04-19 NOTE — Patient Instructions (Signed)
mucinex 600-1200mg  twice a day. Drinking plenty of water.   Upper Respiratory Infection, Adult An upper respiratory infection (URI) is also sometimes known as the common cold. The upper respiratory tract includes the nose, sinuses, throat, trachea, and bronchi. Bronchi are the airways leading to the lungs. Most people improve within 1 week, but symptoms can last up to 2 weeks. A residual cough may last even longer.  CAUSES Many different viruses can infect the tissues lining the upper respiratory tract. The tissues become irritated and inflamed and often become very moist. Mucus production is also common. A cold is contagious. You can easily spread the virus to others by oral contact. This includes kissing, sharing a glass, coughing, or sneezing. Touching your mouth or nose and then touching a surface, which is then touched by another person, can also spread the virus. SYMPTOMS  Symptoms typically develop 1 to 3 days after you come in contact with a cold virus. Symptoms vary from person to person. They may include:  Runny nose.  Sneezing.  Nasal congestion.  Sinus irritation.  Sore throat.  Loss of voice (laryngitis).  Cough.  Fatigue.  Muscle aches.  Loss of appetite.  Headache.  Low-grade fever. DIAGNOSIS  You might diagnose your own cold based on familiar symptoms, since most people get a cold 2 to 3 times a year. Your caregiver can confirm this based on your exam. Most importantly, your caregiver can check that your symptoms are not due to another disease such as strep throat, sinusitis, pneumonia, asthma, or epiglottitis. Blood tests, throat tests, and X-rays are not necessary to diagnose a common cold, but they may sometimes be helpful in excluding other more serious diseases. Your caregiver will decide if any further tests are required. RISKS AND COMPLICATIONS  You may be at risk for a more severe case of the common cold if you smoke cigarettes, have chronic heart disease  (such as heart failure) or lung disease (such as asthma), or if you have a weakened immune system. The very young and very old are also at risk for more serious infections. Bacterial sinusitis, middle ear infections, and bacterial pneumonia can complicate the common cold. The common cold can worsen asthma and chronic obstructive pulmonary disease (COPD). Sometimes, these complications can require emergency medical care and may be life-threatening. PREVENTION  The best way to protect against getting a cold is to practice good hygiene. Avoid oral or hand contact with people with cold symptoms. Wash your hands often if contact occurs. There is no clear evidence that vitamin C, vitamin E, echinacea, or exercise reduces the chance of developing a cold. However, it is always recommended to get plenty of rest and practice good nutrition. TREATMENT  Treatment is directed at relieving symptoms. There is no cure. Antibiotics are not effective, because the infection is caused by a virus, not by bacteria. Treatment may include:  Increased fluid intake. Sports drinks offer valuable electrolytes, sugars, and fluids.  Breathing heated mist or steam (vaporizer or shower).  Eating chicken soup or other clear broths, and maintaining good nutrition.  Getting plenty of rest.  Using gargles or lozenges for comfort.  Controlling fevers with ibuprofen or acetaminophen as directed by your caregiver.  Increasing usage of your inhaler if you have asthma. Zinc gel and zinc lozenges, taken in the first 24 hours of the common cold, can shorten the duration and lessen the severity of symptoms. Pain medicines may help with fever, muscle aches, and throat pain. A variety of non-prescription  medicines are available to treat congestion and runny nose. Your caregiver can make recommendations and may suggest nasal or lung inhalers for other symptoms.  HOME CARE INSTRUCTIONS   Only take over-the-counter or prescription medicines  for pain, discomfort, or fever as directed by your caregiver.  Use a warm mist humidifier or inhale steam from a shower to increase air moisture. This may keep secretions moist and make it easier to breathe.  Drink enough water and fluids to keep your urine clear or pale yellow.  Rest as needed.  Return to work when your temperature has returned to normal or as your caregiver advises. You may need to stay home longer to avoid infecting others. You can also use a face mask and careful hand washing to prevent spread of the virus. SEEK MEDICAL CARE IF:   After the first few days, you feel you are getting worse rather than better.  You need your caregiver's advice about medicines to control symptoms.  You develop chills, worsening shortness of breath, or brown or red sputum. These may be signs of pneumonia.  You develop yellow or brown nasal discharge or pain in the face, especially when you bend forward. These may be signs of sinusitis.  You develop a fever, swollen neck glands, pain with swallowing, or white areas in the back of your throat. These may be signs of strep throat. SEEK IMMEDIATE MEDICAL CARE IF:   You have a fever.  You develop severe or persistent headache, ear pain, sinus pain, or chest pain.  You develop wheezing, a prolonged cough, cough up blood, or have a change in your usual mucus (if you have chronic lung disease).  You develop sore muscles or a stiff neck. Document Released: 10/10/2000 Document Revised: 07/09/2011 Document Reviewed: 07/22/2013 [facility] Patient Information 2015 [place], [place]. This information is not intended to replace advice given to you by your health care provider. Make sure you discuss any questions you have with your health care provider.

## 2014-04-19 NOTE — Progress Notes (Signed)
   Subjective:    Patient ID: Yolanda Mosley, female    DOB: December 03, 1980, 33 y.o.   MRN: [id number]  HPI  Pt presents to the clinic with 4 days of runny nose, ST, headache, ear pressure, chest ache. No wheezing. Tried zicam no relief. Cough is consient but production comes and goes. No fever or chills or body aches. Her nieces are sick right now.   Review of Systems  All other systems reviewed and are negative.      Objective:   Physical Exam  Constitutional: She is oriented to person, place, and time. She appears well-developed and well-nourished.  HENT:  Head: Normocephalic and atraumatic.  Right Ear: External ear normal.  Left Ear: External ear normal.  Mouth/Throat: No oropharyngeal exudate.  Oropharynx erythematous with PND present.  TM's clear bilaterally.  Nasal turbinates red and swollen.   Eyes: Conjunctivae are normal. Right eye exhibits no discharge. Left eye exhibits no discharge.  Neck: Normal range of motion. Neck supple.  Cardiovascular: Normal rate, regular rhythm and normal heart sounds.   Pulmonary/Chest: Effort normal and breath sounds normal. She has no wheezes.  Lymphadenopathy:    She has no cervical adenopathy.  Neurological: She is alert and oriented to person, place, and time.  Skin:  Flushed cheeks.   Psychiatric: She has a normal mood and affect. Her behavior is normal.          Assessment & Plan:  Acute URI- discussed with patient seems to be viral at this time. Nothing bacterial seen on exam.  Discussed adding mucinex twice a day and using hycodan for cough for next 24-48 hours. If symptoms progress or worsen given rx for augmentin to use for 10 days due to christmas holiday. HOgiven. Written out of work for today and instructed to rest.

## 2014-05-04 ENCOUNTER — Other Ambulatory Visit: Payer: Self-pay | Admitting: *Deleted

## 2014-05-04 MED ORDER — FLUCONAZOLE 150 MG PO TABS: 150.0000 mg | ORAL_TABLET | Freq: Once | ORAL | Status: DC

## 2014-05-05 ENCOUNTER — Other Ambulatory Visit: Payer: Self-pay

## 2014-05-05 MED ORDER — LISDEXAMFETAMINE DIMESYLATE 70 MG PO CAPS: 70.0000 mg | ORAL_CAPSULE | ORAL | Status: DC

## 2014-05-28 ENCOUNTER — Telehealth: Payer: Self-pay

## 2014-05-28 NOTE — Telephone Encounter (Signed)
Patient left a message wanting a refill on Vyvanse. I called and left a message for patient to return call.   Last refill was on 05/05/2014. So it is about a week early for a refill. Also she was last seen in September and she is due for a follow up.

## 2014-06-03 ENCOUNTER — Telehealth: Payer: Self-pay | Admitting: Family Medicine

## 2014-06-03 NOTE — Telephone Encounter (Signed)
Yolanda Mosley called. She wants refill on Vyvanse. She has made an appt for 2/12.  Phone contact [phone]

## 2014-06-06 MED ORDER — LISDEXAMFETAMINE DIMESYLATE 70 MG PO CAPS: 70.0000 mg | ORAL_CAPSULE | ORAL | Status: DC

## 2014-06-06 NOTE — Telephone Encounter (Signed)
Prescription will be ready after 1 pm on Monday.

## 2014-06-06 NOTE — Telephone Encounter (Signed)
Prescription will be ready for pick up after 1 pm Monday.

## 2014-06-09 ENCOUNTER — Ambulatory Visit (INDEPENDENT_AMBULATORY_CARE_PROVIDER_SITE_OTHER): Payer: BLUE CROSS/BLUE SHIELD | Admitting: Physician Assistant

## 2014-06-09 ENCOUNTER — Ambulatory Visit: Payer: Self-pay | Admitting: Physician Assistant

## 2014-06-09 ENCOUNTER — Encounter: Payer: Self-pay | Admitting: Physician Assistant

## 2014-06-09 VITALS — BP 115/70 | HR 85 | Ht 64.0 in | Wt 209.0 lb

## 2014-06-09 DIAGNOSIS — A499 Bacterial infection, unspecified: Secondary | ICD-10-CM | POA: Diagnosis not present

## 2014-06-09 DIAGNOSIS — H1089 Other conjunctivitis: Secondary | ICD-10-CM | POA: Diagnosis not present

## 2014-06-09 DIAGNOSIS — F909 Attention-deficit hyperactivity disorder, unspecified type: Secondary | ICD-10-CM

## 2014-06-09 DIAGNOSIS — F988 Other specified behavioral and emotional disorders with onset usually occurring in childhood and adolescence: Secondary | ICD-10-CM

## 2014-06-09 DIAGNOSIS — H109 Unspecified conjunctivitis: Secondary | ICD-10-CM

## 2014-06-09 MED ORDER — LISDEXAMFETAMINE DIMESYLATE 70 MG PO CAPS: 70.0000 mg | ORAL_CAPSULE | ORAL | Status: DC

## 2014-06-09 MED ORDER — POLYMYXIN B-TRIMETHOPRIM 10000-0.1 UNIT/ML-% OP SOLN: 1.0000 [drp] | Freq: Four times a day (QID) | OPHTHALMIC | Status: DC

## 2014-06-09 NOTE — Patient Instructions (Signed)

## 2014-06-09 NOTE — Progress Notes (Signed)
   Subjective:    Patient ID: Yolanda Mosley, female    DOB: [birth year], 34 y.o.   MRN: [id number]  HPI Patient is a 34 year old female who presents to the clinic for follow-up on ADD. She is currently on Vyvanse 70 mg daily. She denies any problems or complications with medication. She denies any insomnia, increase in anxiety or tachycardia. She needs refill today.  She also has a left red itchy and burning I. She woke up this morning with symptoms. There has been clear to green discharge. She had to use a wet cloth to him that her eyes [place] morning. She denies any fever, chills, sinus pressure, ear pain, sore throat cough or wheezing.   Review of Systems  All other systems reviewed and are negative.      Objective:   Physical Exam  Constitutional: She is oriented to person, place, and time. She appears well-developed and well-nourished.  HENT:  Head: Normocephalic and atraumatic.  Right Ear: External ear normal.  Left Ear: External ear normal.  Nose: Nose normal.  Mouth/Throat: Oropharynx is clear and moist. No oropharyngeal exudate.  Eyes: EOM are normal. Pupils are equal, round, and reactive to light. Right eye exhibits no discharge. Left eye exhibits discharge.  Injected left conjunctiva with active diffuse white to green discharge.  Neck: Normal range of motion. Neck supple.  Cardiovascular: Normal rate, regular rhythm and normal heart sounds.   Pulmonary/Chest: Effort normal and breath sounds normal.  Lymphadenopathy:    She has no cervical adenopathy.  Neurological: She is alert and oriented to person, place, and time.  Skin: Skin is dry.  Psychiatric: She has a normal mood and affect. Her behavior is normal.          Assessment & Plan:  Bacterial conjunctivitis-treated with Polytrim 1 drop 4 times a day for 7-10 days. Gave handout on transmission. Written on work for today and tomorrow.  ADD-refilled Vyvanse for 3 months. Follow-up in 3 months.

## 2014-06-11 ENCOUNTER — Ambulatory Visit: Payer: Self-pay | Admitting: Family Medicine

## 2014-07-11 ENCOUNTER — Other Ambulatory Visit: Payer: Self-pay | Admitting: Family Medicine

## 2014-07-21 ENCOUNTER — Ambulatory Visit (INDEPENDENT_AMBULATORY_CARE_PROVIDER_SITE_OTHER): Payer: BLUE CROSS/BLUE SHIELD | Admitting: Family Medicine

## 2014-07-21 ENCOUNTER — Encounter: Payer: Self-pay | Admitting: Family Medicine

## 2014-07-21 VITALS — BP 116/82 | HR 67 | Wt 209.0 lb

## 2014-07-21 DIAGNOSIS — H1089 Other conjunctivitis: Secondary | ICD-10-CM | POA: Diagnosis not present

## 2014-07-21 DIAGNOSIS — A499 Bacterial infection, unspecified: Secondary | ICD-10-CM

## 2014-07-21 DIAGNOSIS — H109 Unspecified conjunctivitis: Secondary | ICD-10-CM

## 2014-07-21 MED ORDER — ERYTHROMYCIN 5 MG/GM OP OINT: 1.0000 "application " | TOPICAL_OINTMENT | Freq: Every day | OPHTHALMIC | Status: DC

## 2014-07-21 NOTE — Progress Notes (Signed)
CC: Yolanda Mosley is a 34 y.o. female is here for Acute Visit   Subjective: HPI:  Prior to going to bed last night she noticed watering of her right eye with some itching. She then noticed that there was redness in the periphery of the eye. Since then she's had some green discharge that she has to clear every few hours. The eye itches but there is no pain. She denies photophobia or vision loss. No known sick contacts. Denies fevers, chills, nasal congestion, sore throat, cough nor headache. Symptoms have been persistent and moderate in severity since onset   Review Of Systems Outlined In HPI  Past Medical History  Diagnosis Date  . CA - cancer 2005  . Hemorrhoids     laser tx   . Hypertension   . Asthma     Past Surgical History  Procedure Laterality Date  . Ovary, bilater fallopian tubes removed  2005   Family History  Problem Relation Age of Onset  . Hypertension Mother   . Diabetes Other   . Other Other     cardiovascular disorder    History   Social History  . Marital Status: Single    Spouse Name: N/A  . Number of Children: N/A  . Years of Education: N/A   Occupational History  . Not on file.   Social History Main Topics  . Smoking status: Never Smoker   . Smokeless tobacco: Not on file  . Alcohol Use: Yes     Comment: occasionally  . Drug Use: No  . Sexual Activity: Not on file   Other Topics Concern  . Not on file   Social History Narrative     Objective: BP 116/82 mmHg  Pulse 67  Wt 209 lb (94.802 kg)  SpO2 99%  General: Alert and Oriented, No Acute Distress HEENT: Pupils equal, round, reactive to light. Left Conjunctivae clear. Right conjunctival erythema in the periphery resolving as it approaches the limbus. No debris in either anterior chamber. External ears unremarkable, canals clear with intact TMs with appropriate landmarks.  Middle ear appears open without effusion. Pink inferior turbinates.  Moist mucous membranes, pharynx without  inflammation nor lesions.  Neck supple without palpable lymphadenopathy nor abnormal masses. No apparent photophobia. Extremities: No peripheral edema.  Strong peripheral pulses.  Mental Status: No depression, anxiety, nor agitation. Skin: Warm and dry.  Assessment & Plan: Yolanda Mosley was seen today for acute visit.  Diagnoses and all orders for this visit:  Bacterial conjunctivitis Orders: -     erythromycin ophthalmic ointment; Place 1 application into the right eye 5 (five) times daily. For seven days if the prior eye drops did not help.   Bacterial conjunctivitis: she already has Polytrim at least half a bottle left over at home from an episode last month. I've asked her to restart this and if no benefit by Saturday switch to erythromycin.Signs and symptoms requring emergent/urgent reevaluation were discussed with the patient.  Return if symptoms worsen or fail to improve.

## 2014-07-28 ENCOUNTER — Ambulatory Visit: Payer: BLUE CROSS/BLUE SHIELD | Admitting: Physician Assistant

## 2014-07-29 ENCOUNTER — Ambulatory Visit (INDEPENDENT_AMBULATORY_CARE_PROVIDER_SITE_OTHER): Payer: BLUE CROSS/BLUE SHIELD | Admitting: Family Medicine

## 2014-07-29 ENCOUNTER — Telehealth: Payer: Self-pay | Admitting: Family Medicine

## 2014-07-29 ENCOUNTER — Encounter: Payer: Self-pay | Admitting: Family Medicine

## 2014-07-29 VITALS — BP 129/86 | HR 87 | Wt 210.0 lb

## 2014-07-29 DIAGNOSIS — H1012 Acute atopic conjunctivitis, left eye: Secondary | ICD-10-CM

## 2014-07-29 MED ORDER — OLOPATADINE HCL 0.2 % OP SOLN: 1.0000 [drp] | Freq: Every day | OPHTHALMIC | Status: DC

## 2014-07-29 MED ORDER — OLOPATADINE HCL 0.1 % OP SOLN: 1.0000 [drp] | Freq: Two times a day (BID) | OPHTHALMIC | Status: DC | PRN

## 2014-07-29 NOTE — Telephone Encounter (Signed)
New rx sent for pataday

## 2014-07-29 NOTE — Telephone Encounter (Signed)
[facility]) faxed to inform the medication Patanol 0.1% ophthalmic solution is on backorder. Requesting you send a different Rx.

## 2014-07-29 NOTE — Progress Notes (Signed)
CC: Yolanda Mosley is a 34 y.o. female is here for Acute Visit   Subjective: HPI:  Left eye redness that began Monday of this week has been persistent ever since. Present all hours of the day. Accompanied by blurring of vision when watering however this resolves if she blinks or wipes tears from her eyes. She denies any discharge other than mild increase in tear production. Interventions have included using Polytrim however no benefit after using this for the past 48 hours. No other interventions as of yet. She reports that it itches but there is no pain. No photophobia, fevers, chills, headache or other vision loss other than described above. Review of systems positive for nasal congestion   Review Of Systems Outlined In HPI  Past Medical History  Diagnosis Date  . CA - cancer 2005  . Hemorrhoids     laser tx   . Hypertension   . Asthma     Past Surgical History  Procedure Laterality Date  . Ovary, bilater fallopian tubes removed  2005   Family History  Problem Relation Age of Onset  . Hypertension Mother   . Diabetes Other   . Other Other     cardiovascular disorder    History   Social History  . Marital Status: Single    Spouse Name: N/A  . Number of Children: N/A  . Years of Education: N/A   Occupational History  . Not on file.   Social History Main Topics  . Smoking status: Never Smoker   . Smokeless tobacco: Not on file  . Alcohol Use: Yes     Comment: occasionally  . Drug Use: No  . Sexual Activity: Not on file   Other Topics Concern  . Not on file   Social History Narrative     Objective: BP 129/86 mmHg  Pulse 87  Wt 210 lb (95.255 kg)  SpO2 97%  General: Alert and Oriented, No Acute Distress HEENT: Pupils equal, round, reactive to light. Right Conjunctivae clear, left conjunctiva with peripheral injection improving closer to the limbus.  Anterior chamber of both eyes appears open without debris.  Neuro: Cranial nerves II through XII grossly  intact Cardiac: Regular rate and rhythm.  Extremities: No peripheral edema.  Strong peripheral pulses.  Mental Status: No depression, anxiety, nor agitation. Skin: Warm and dry.  Assessment & Plan: Yesica was seen today for acute visit.  Diagnoses and all orders for this visit:  Allergic conjunctivitis, left  Other orders -     olopatadine (PATANOL) 0.1 % ophthalmic solution; Place 1 drop into the left eye 2 (two) times daily as needed (red or itching eyes).   Conjunctivitis today looks to be allergic rather than infectious, I have encouraged her to stop using Polytrim since this would've resolved her symptoms should her conjunctivae does be due to bacteria. Still possibility that this could be viral therefore discussed contagiousness precautions. Start Patanol.Signs and symptoms requring emergent/urgent reevaluation were discussed with the patient.    Return if symptoms worsen or fail to improve.

## 2014-08-05 ENCOUNTER — Other Ambulatory Visit: Payer: Self-pay | Admitting: Family Medicine

## 2014-08-10 ENCOUNTER — Encounter: Payer: Self-pay | Admitting: Physician Assistant

## 2014-08-10 ENCOUNTER — Ambulatory Visit (INDEPENDENT_AMBULATORY_CARE_PROVIDER_SITE_OTHER): Payer: BLUE CROSS/BLUE SHIELD | Admitting: Physician Assistant

## 2014-08-10 ENCOUNTER — Ambulatory Visit (INDEPENDENT_AMBULATORY_CARE_PROVIDER_SITE_OTHER): Payer: BLUE CROSS/BLUE SHIELD

## 2014-08-10 VITALS — BP 124/82 | HR 86 | Wt 210.0 lb

## 2014-08-10 DIAGNOSIS — M62830 Muscle spasm of back: Secondary | ICD-10-CM | POA: Diagnosis not present

## 2014-08-10 DIAGNOSIS — M549 Dorsalgia, unspecified: Secondary | ICD-10-CM

## 2014-08-10 DIAGNOSIS — M545 Low back pain, unspecified: Secondary | ICD-10-CM

## 2014-08-10 DIAGNOSIS — M5137 Other intervertebral disc degeneration, lumbosacral region: Secondary | ICD-10-CM

## 2014-08-10 HISTORY — DX: Dorsalgia, unspecified: M54.9

## 2014-08-10 HISTORY — DX: Low back pain, unspecified: M54.50

## 2014-08-10 HISTORY — DX: Muscle spasm of back: M62.830

## 2014-08-10 MED ORDER — MELOXICAM 15 MG PO TABS: 15.0000 mg | ORAL_TABLET | Freq: Every day | ORAL | Status: DC

## 2014-08-10 MED ORDER — KETOROLAC TROMETHAMINE 60 MG/2ML IM SOLN
60.0000 mg | Freq: Once | INTRAMUSCULAR | Status: AC
  Administered 2014-08-10: 60 mg via INTRAMUSCULAR

## 2014-08-10 MED ORDER — CYCLOBENZAPRINE HCL 10 MG PO TABS: 10.0000 mg | ORAL_TABLET | Freq: Three times a day (TID) | ORAL | Status: DC | PRN

## 2014-08-10 NOTE — Patient Instructions (Signed)
Back brace.   Follow up after physical therapy.

## 2014-08-10 NOTE — Progress Notes (Signed)
   Subjective:    Patient ID: Yolanda Mosley, female    DOB: 1980-06-09, 34 y.o.   MRN: [id number]  HPI  Pt is a 34 yo female who presents to the clinic with mid to low back pain off and on for over a year that has worsened over past week. She is working 8-10 hour days at [employer] with a lot of lifting at least 5 days a week. 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 She denies any radiation into legs, saddle anesthesia, numbness and tingling of extremities. No known injury. She describes pain as dull pain that occasionally locks up and is "shooting". Tried ice, heat, tylenol. Not tried ibuprofen. Little to no relief. She complains of being hard to get out of bed some mornings and even turn in bed.    Review of Systems  All other systems reviewed and are negative.      Objective:   Physical Exam  Constitutional: She is oriented to person, place, and time. She appears well-developed and well-nourished.  HENT:  Head: Normocephalic and atraumatic.  Cardiovascular: Normal rate, regular rhythm and normal heart sounds.   Musculoskeletal:  ROM at waist is full but with some pain.  Tenderness over bilateral lower back that radiates up into mid-back.  Negative straight leg test, bilaterally.  Patellar reflexes 2+, symmetric.  No pain over spine to palpation.   Neurological: She is alert and oriented to person, place, and time. She has normal reflexes.  Skin: Skin is dry.  Psychiatric: She has a normal mood and affect. Her behavior is normal.          Assessment & Plan:  Mid/low back pain/muscle spasms- I certainly think there is a muscular element to pain due to type of work doing. Will get xrays today.  toradol 60mg  IM given today. mobic for next 2 weeks. Flexeril as needed. Sedation warning given. Will set up for formal PT. Discussed alternating heat and ice. Written note for work for restrictions while in PT. Follow up after PT.

## 2014-08-11 ENCOUNTER — Encounter: Payer: Self-pay | Admitting: Physician Assistant

## 2014-08-11 DIAGNOSIS — M51369 Other intervertebral disc degeneration, lumbar region without mention of lumbar back pain or lower extremity pain: Secondary | ICD-10-CM

## 2014-08-11 DIAGNOSIS — M5136 Other intervertebral disc degeneration, lumbar region: Secondary | ICD-10-CM

## 2014-08-11 HISTORY — DX: Other intervertebral disc degeneration, lumbar region without mention of lumbar back pain or lower extremity pain: M51.369

## 2014-08-11 HISTORY — DX: Other intervertebral disc degeneration, lumbar region: M51.36

## 2014-08-12 ENCOUNTER — Ambulatory Visit (INDEPENDENT_AMBULATORY_CARE_PROVIDER_SITE_OTHER): Payer: BLUE CROSS/BLUE SHIELD | Admitting: Physical Therapy

## 2014-08-12 DIAGNOSIS — M545 Low back pain, unspecified: Secondary | ICD-10-CM

## 2014-08-12 DIAGNOSIS — M546 Pain in thoracic spine: Secondary | ICD-10-CM

## 2014-08-12 DIAGNOSIS — M6281 Muscle weakness (generalized): Secondary | ICD-10-CM | POA: Diagnosis not present

## 2014-08-12 NOTE — Therapy (Signed)
[place], [place], [address] Phone: [phone]   Fax:  [phone]  Physical Therapy Evaluation  Patient Details  Name: Yolanda Mosley MRN: [id number] Date of Birth: April 29, 1981 Referring Provider:  Donella Stade, PA-C  Encounter Date: 08/12/2014      PT End of Session - 08/12/14 1059    Visit Number 1   Number of Visits 12   Date for PT Re-Evaluation 09/24/14   PT Start Time 1101   PT Stop Time 1202   PT Time Calculation (min) 61 min   Activity Tolerance Patient tolerated treatment well   Behavior During Therapy [facility] for tasks assessed/performed      Past Medical History  Diagnosis Date  . CA - cancer 2005  . Hemorrhoids     laser tx   . Hypertension   . Asthma     Past Surgical History  Procedure Laterality Date  . Ovary, bilater fallopian tubes removed  2005    There were no vitals filed for this visit.  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 Last year pulled muscle down her back. Last week she started having LBP which would not resolve. Most of pain iis in the low back and is a burning sensatin. She also gets intermittent sharp pains.    Limitations Sitting;Lifting  lying on her back or stomach is worse.   How long can you sit comfortably? 10-15   Diagnostic tests xrays show narrowing of L5/S1 disc space   Patient Stated Goals get rid of pain and sleep through the night   Currently in Pain? Yes   Pain Score 3   8-9/10 with lying down   Pain Location Back   Pain Orientation Right;Left;Posterior   Pain Descriptors / Indicators Burning   Pain  Type Acute pain   Pain Onset In the past 7 days   Pain Frequency Constant   Aggravating Factors  lying down, sitting   Pain Relieving Factors ice, tylenol, meds   Effect of Pain on Daily Activities unable to sleep   Multiple Pain Sites No            [facility] PT Assessment - 08/12/14 0001    Assessment   Medical Diagnosis mid back pain, LBP w/o sciatica, muscle spasms   Onset Date 09/24/14   Next MD Visit 09/24/14   Precautions   Precaution Comments no lifting over 25#, no excessive bending and stooping   Balance Screen   Has the patient fallen in the past 6 months No   Has the patient had a decrease in activity level because of a fear of falling?  No   Is the patient reluctant to leave their home because of a fear of falling?  No   Prior Function   Level of Independence Independent with basic ADLs   Vocation Full time employment   Vocation Requirements lifting 50#   Observation/Other Assessments   Focus on Therapeutic Outcomes (FOTO)  44% (goal 26% limited)   Posture/Postural Control   Posture/Postural Control Postural limitations   Postural Limitations Rounded Shoulders;Forward head;Anterior pelvic tilt  ROM / Strength   AROM / PROM / Strength AROM;Strength   AROM   Overall AROM Comments Lumbar WNL except right rot decreased 25%, some pain with right SB and flexion.   Strength   Overall Strength Comments Weak transverse Abd.   Strength Assessment Site Hip   Right/Left Hip Right;Left   Right Hip Flexion 5/5   Right Hip Extension --  5-/5   Right Hip ABduction 5/5   Left Hip Flexion 5/5   Left Hip Extension 5/5   Left Hip ABduction 4-/5  pain in left side   Palpation   Palpation tender in bil gluteals, left piriformis, bil paraspinals Lumbar and Thoracic.  decreased UPA L3/4/5 bil with pain.> pain at left L5S1   Special Tests    Special Tests Lumbar  SLR + (lt>rt)                   [facility] Adult PT Treatment/Exercise - 08/12/14 0001    Exercises    Exercises Lumbar   Lumbar Exercises: Supine   Ab Set 10 reps;5 seconds   AB Set Limitations VCs for pelvic floor contraction   Modalities   Modalities Cryotherapy;Electrical Stimulation   Cryotherapy   Number Minutes Cryotherapy 20 Minutes   Cryotherapy Location Back  thoracic and lumbar   Type of Cryotherapy Ice pack   Electrical Stimulation   Electrical Stimulation Location thoracic/lumbar   Electrical Stimulation Action to tolerance   Electrical Stimulation Parameters 80-150hz    Electrical Stimulation Goals Pain                PT Education - 08/12/14 1157    Education provided Yes   Education Details HEP - TrAb; body mechanics for lifting, no twisting; sitting with lumbar support, avoid soft chairs   Person(s) Educated Patient   Methods Explanation;Demonstration;Handout   Comprehension Verbalized understanding;Returned demonstration          PT Short Term Goals - 08/12/14 1211    PT SHORT TERM GOAL #1   Title i with initial hep   Time 2   Period Weeks   Status New   PT SHORT TERM GOAL #2   Title decreased pain at night to 6/10 or less   Time 2   Period Weeks   Status New           PT Long Term Goals - 08/12/14 1212    PT LONG TERM GOAL #1   Title I with advanced hep   Time 6   Period Weeks   Status New   PT LONG TERM GOAL #2   Title LBP of 1/10 or less with ADLS   Time 6   Period Weeks   Status New   PT LONG TERM GOAL #3   Title patient able to lift 50# floor to waist with no increase in pain   Time 6   Period Weeks   Status New   PT LONG TERM GOAL #4   Title Pt able to sleep through the night without waking from pain   Time 6   Period Weeks   Status New   PT LONG TERM GOAL #5   Title Improve FOTO to 26%   Time 6   Period Weeks   Status New               Plan - 08/12/14 1208    Clinical Impression Statement Patient presents with acute LBP with spasms into thoracic paraspinals. She has mild ROM and LE  strength deficits and  a very weak core. She has to lift up to 50# at work on a weekly basis.   Pt will benefit from skilled therapeutic intervention in order to improve on the following deficits Decreased range of motion;Postural dysfunction;Decreased activity tolerance;Pain;Increased muscle spasms;Decreased mobility;Decreased strength   Rehab Potential Excellent   PT Frequency 2x / week   PT Duration 6 weeks   PT Treatment/Interventions ADLs/Self Care Home Management;Moist Heat;Patient/family education;Passive range of motion;Therapeutic exercise;Traction;Ultrasound;Manual techniques;Cryotherapy;Neuromuscular re-education;Electrical Stimulation   PT Next Visit Plan core strengthening, STW, mobs prn, modalities for pain   Consulted and Agree with Plan of Care Patient         Problem List Patient Active Problem List   Diagnosis Date Noted  . DDD (degenerative disc disease), lumbar 08/11/2014  . Muscle spasm of back 08/10/2014  . Mid back pain 08/10/2014  . Bilateral low back pain without sciatica 08/10/2014  . Viral syndrome 02/04/2014  . History of ovarian cancer 01/20/2014  . Gastric ulcer 09/14/2013  . Anemia, iron deficiency 09/09/2013  . Tachycardia 04/15/2012  . Obesity 05/31/2011  . ADD (attention deficit disorder) 03/01/2011  . OSA on CPAP 01/08/2011  . HYPERTENSION, MILD 01/09/2010  . EXTERNAL HEMORRHOIDS 01/09/2010  . ALLERGIC RHINITIS 01/09/2010    Madelyn Flavors PT 08/12/2014, 12:22 PM  [facility] [place], [place], [address] Phone: [phone]   Fax:  [phone]

## 2014-08-12 NOTE — Patient Instructions (Signed)
  Transverse Abdominus   Tighten stomach muscles by pulling belly button toward spine. Engage pelvic floor muscles if needed. Hold 5 seconds.  Repeat _10___ times per set. Do _1-2___ sets per session. Do __3-4__ sessions per day.  http://orth.exer.us/134   Copyright  VHI. All rights reserved.

## 2014-08-17 ENCOUNTER — Ambulatory Visit (INDEPENDENT_AMBULATORY_CARE_PROVIDER_SITE_OTHER): Payer: BLUE CROSS/BLUE SHIELD | Admitting: Physical Therapy

## 2014-08-17 DIAGNOSIS — M546 Pain in thoracic spine: Secondary | ICD-10-CM | POA: Diagnosis not present

## 2014-08-17 DIAGNOSIS — M545 Low back pain, unspecified: Secondary | ICD-10-CM

## 2014-08-17 DIAGNOSIS — M6281 Muscle weakness (generalized): Secondary | ICD-10-CM | POA: Diagnosis not present

## 2014-08-17 NOTE — Therapy (Signed)
[place], [place], [address] Phone: [phone]   Fax:  [phone]  Physical Therapy Treatment  Patient Details  Name: Yolanda Mosley MRN: [id number] Date of Birth: [birth year] Referring Provider:  Donella Stade, PA-C  Encounter Date: 08/17/2014      PT End of Session - 08/17/14 1102    Visit Number 2   Date for PT Re-Evaluation 09/24/14   PT Start Time 1102   PT Stop Time 1829   PT Time Calculation (min) 54 min   Activity Tolerance Patient limited by pain      Past Medical History  Diagnosis Date  . CA - cancer 2005  . Hemorrhoids     laser tx   . Hypertension   . Asthma     Past Surgical History  Procedure Laterality Date  . Ovary, bilater fallopian tubes removed  2005    There were no vitals filed for this visit.  Visit Diagnosis:  Bilateral low back pain without sciatica  Pain in thoracic spine  Generalized muscle weakness      Subjective Assessment - 08/17/14 1105    Subjective Pt reported pain relief after last session; pain returned over weeknend. Laying on back still caused discomfort.    Currently in Pain? Yes   Pain Score 4    Pain Location Back   Pain Orientation Right;Posterior   Aggravating Factors  lifting heavy objects and bending over   Pain Relieving Factors ice, medication             [facility] PT Assessment - 08/17/14 0001    Assessment   Medical Diagnosis mid back pain, LBP w/o sciatica, muscle spasms   Onset Date 07/26/14   Next MD Visit 09/24/14                     [facility] Adult PT Treatment/Exercise - 08/17/14 0001    Lumbar Exercises: Aerobic   Stationary Bike NuStep L4 x 5 min   Lumbar Exercises: Seated   Long Arc Quad on [place] 10 reps;Right;Left  sitting on dyna disc   Hip Flexion on Ball 20 reps;Left;Both  sitting on dyna disc    Other Seated Lumbar Exercises Seated on therapy ball: pelvic tilts ant/post and side to side, CW/  CCW x 12 each way. Gentle bouncing. (no pain with any of these)   Other Seated Lumbar Exercises Seated Abd set x 5 sec hold x 5 reps.    Lumbar Exercises: Sidelying   Clam 10 reps  2 sets each side.    Lumbar Exercises: Quadruped   Straight Leg Raise 10 reps  each side    Straight Leg Raises Limitations no pain    Other Quadruped Lumbar Exercises Opp arm and leg (challenging) x 10 each leg.    Other Quadruped Lumbar Exercises Childs pose with lat trunk flexion x 10 sec each side; Cat/cow x 10 reps    Modalities   Modalities Cryotherapy;Electrical Stimulation  in sidelying    Cryotherapy   Number Minutes Cryotherapy 15 Minutes   Cryotherapy Location Back   Type of Cryotherapy Ice pack   Electrical Stimulation   Electrical Stimulation Location Lumbar/SI joint B   Electrical Stimulation Action to tolerance    Electrical Stimulation Parameters IFC 80-150 hr   Electrical Stimulation Goals Pain                PT Education - 08/17/14 1148    Education provided Yes  Education Details HEP.  Also, pt to engage core during transition movements.    Person(s) Educated Patient   Methods Explanation;Demonstration;Handout   Comprehension Verbalized understanding;Returned demonstration          PT Short Term Goals - 08/12/14 1211    PT SHORT TERM GOAL #1   Title i with initial hep   Time 2   Period Weeks   Status New   PT SHORT TERM GOAL #2   Title decreased pain at night to 6/10 or less   Time 2   Period Weeks   Status New           PT Long Term Goals - 08/12/14 1212    PT LONG TERM GOAL #1   Title I with advanced hep   Time 6   Period Weeks   Status New   PT LONG TERM GOAL #2   Title LBP of 1/10 or less with ADLS   Time 6   Period Weeks   Status New   PT LONG TERM GOAL #3   Title patient able to lift 50# floor to waist with no increase in pain   Time 6   Period Weeks   Status New   PT LONG TERM GOAL #4   Title Pt able to sleep through the night without  waking from pain   Time 6   Period Weeks   Status New   PT LONG TERM GOAL #5   Title Improve FOTO to 26%   Time 6   Period Weeks   Status New               Plan - 08/17/14 1117    Clinical Impression Statement Pt unable to tolerate hooklying exercises due to increased LB/SI joint pain, however tolerated seated and quadruped exercises without increase in pain. Pt reported decrease in pain in LB when engaging core during transitions and after treatment today. No goals yet; only second visit.    Pt will benefit from skilled therapeutic intervention in order to improve on the following deficits Decreased range of motion;Postural dysfunction;Decreased activity tolerance;Pain;Increased muscle spasms;Decreased mobility;Decreased strength   Rehab Potential Excellent   PT Frequency 2x / week   PT Duration 6 weeks   PT Treatment/Interventions ADLs/Self Care Home Management;Moist Heat;Patient/family education;Passive range of motion;Therapeutic exercise;Traction;Ultrasound;Manual techniques;Cryotherapy;Neuromuscular re-education;Electrical Stimulation   PT Next Visit Plan core strengthening, STW, mobs prn, modalities for pain   Consulted and Agree with Plan of Care Patient        Problem List Patient Active Problem List   Diagnosis Date Noted  . DDD (degenerative disc disease), lumbar 08/11/2014  . Muscle spasm of back 08/10/2014  . Mid back pain 08/10/2014  . Bilateral low back pain without sciatica 08/10/2014  . Viral syndrome 02/04/2014  . History of ovarian cancer 01/20/2014  . Gastric ulcer 09/14/2013  . Anemia, iron deficiency 09/09/2013  . Tachycardia 04/15/2012  . Obesity 05/31/2011  . ADD (attention deficit disorder) 03/01/2011  . OSA on CPAP 01/08/2011  . HYPERTENSION, MILD 01/09/2010  . EXTERNAL HEMORRHOIDS 01/09/2010  . ALLERGIC RHINITIS 01/09/2010    Kerin Perna, PTA 08/17/2014 1:12 PM  [facility]  [place] [place], [place], [address] Phone: [phone]   Fax:  [phone]

## 2014-08-17 NOTE — Patient Instructions (Addendum)
Bracing With Leg Raise (Quadruped)   On hands and knees find neutral spine. Tighten pelvic floor and abdominals and hold. Alternating legs, straighten and lift to hip level. Repeat __10_ times, 2 sets . Do _1__ times a day.   Copyright  VHI. All rights reserved.  Clam   Lie on side, legs bent 90. Open top knee to ceiling, rotating leg outward. Touch toes to ankle of bottom leg. Close knees, rotating leg inward. Maintain hip position. Repeat __10__ times, 2 sets.  SLOWLY. Repeat on other side. Do __1__ sessions per day.  Copyright  VHI. All rights reserved.  Cat / Cow Flow   Inhale, press spine toward ceiling like a Halloween cat. Keeping strength in arms and abdominals, exhale to soften spine through neutral and into cow pose. Open chest and arch back. Initiate movement between cat and cow at tailbone, one vertebrae at a time. Repeat __5-10__ times, daily.   Copyright  VHI. All rights reserved.   [facility] [place], [place] [address]  [phone] (office) [phone] (fax)

## 2014-08-19 ENCOUNTER — Ambulatory Visit (INDEPENDENT_AMBULATORY_CARE_PROVIDER_SITE_OTHER): Payer: BLUE CROSS/BLUE SHIELD | Admitting: Physical Therapy

## 2014-08-19 DIAGNOSIS — M546 Pain in thoracic spine: Secondary | ICD-10-CM | POA: Diagnosis not present

## 2014-08-19 DIAGNOSIS — M6281 Muscle weakness (generalized): Secondary | ICD-10-CM

## 2014-08-19 DIAGNOSIS — M545 Low back pain, unspecified: Secondary | ICD-10-CM

## 2014-08-19 NOTE — Therapy (Signed)
[place]  [place], [place], [address] Phone: [phone]   Fax:  [phone]  Physical Therapy Treatment  Patient Details  Name: Yolanda Mosley MRN: [id number] Date of Birth: [birth year] Referring Provider:  Donella Stade, PA-C  Encounter Date: 08/19/2014      PT End of Session - 08/19/14 1151    Visit Number 3   Number of Visits 12   Date for PT Re-Evaluation 09/24/14   PT Start Time 1150   PT Stop Time 1237   PT Time Calculation (min) 47 min   Activity Tolerance Patient tolerated treatment well;No increased pain      Past Medical History  Diagnosis Date  . CA - cancer 2005  . Hemorrhoids     laser tx   . Hypertension   . Asthma     Past Surgical History  Procedure Laterality Date  . Ovary, bilater fallopian tubes removed  2005    There were no vitals filed for this visit.  Visit Diagnosis:  Bilateral low back pain without sciatica  Pain in thoracic spine  Generalized muscle weakness      Subjective Assessment - 08/19/14 1152    Subjective Pt was able to fall asleep in supported hooklying.  Didn't do any lifting yesterday and had 4-5/10 but is reporting increased pain today.    Pain Score 6    Pain Location Back   Pain Orientation Left;Posterior  across beltline    Pain Descriptors / Indicators Burning            [facility] PT Assessment - 08/19/14 0001    Assessment   Medical Diagnosis mid back pain, LBP w/o sciatica, muscle spasms   Onset Date 07/26/14   Next MD Visit 09/24/14                     [facility] Adult PT Treatment/Exercise - 08/19/14 0001    Lumbar Exercises: Aerobic   Stationary Bike NuStep L4 x 5 min   Lumbar Exercises: Sidelying   Other Sidelying Lumbar Exercises prolonged sidelying stretch over green bolster (with arm overhead x 2 min each side x 2 reps    Lumbar Exercises: Prone   Other Prone Lumbar Exercises Prone press ups x 8 reps    Modalities    Modalities Electrical Stimulation;Ultrasound   Electrical Stimulation   Electrical Stimulation Location (combo) to bilateral QL in sidelying position   Electrical Stimulation Action to tolerance   Electrical Stimulation Parameters x 8 min to each QL   Electrical Stimulation Goals Pain   Ultrasound   Ultrasound Location bilat QL    Ultrasound Parameters 3.3 hz, 1.1 w/cm2, 100% x 8 min to each side    Ultrasound Goals Pain                PT Education - 08/19/14 1257    Education provided Yes   Education Details Pt educated on proper body mechanics for lifting at work and lifting 3 yr old niece.    Person(s) Educated Patient   Methods Explanation;Demonstration   Comprehension Returned demonstration;Verbalized understanding          PT Short Term Goals - 08/12/14 1211    PT SHORT TERM GOAL #1   Title i with initial hep   Time 2   Period Weeks   Status New   PT SHORT TERM GOAL #2   Title decreased pain at night to 6/10 or less   Time 2  Period Weeks   Status New           PT Long Term Goals - 08/12/14 1212    PT LONG TERM GOAL #1   Title I with advanced hep   Time 6   Period Weeks   Status New   PT LONG TERM GOAL #2   Title LBP of 1/10 or less with ADLS   Time 6   Period Weeks   Status New   PT LONG TERM GOAL #3   Title patient able to lift 50# floor to waist with no increase in pain   Time 6   Period Weeks   Status New   PT LONG TERM GOAL #4   Title Pt able to sleep through the night without waking from pain   Time 6   Period Weeks   Status New   PT LONG TERM GOAL #5   Title Improve FOTO to 26%   Time 6   Period Weeks   Status New               Plan - 08/19/14 1252    Clinical Impression Statement Pt reported decreased pain in sidelying position and resolution of symptoms after application of combo [place] to bilateral QL.  Pt reported decreased LBP with use of core engagement with transitional movements.    Pt will benefit from  skilled therapeutic intervention in order to improve on the following deficits Decreased range of motion;Postural dysfunction;Decreased activity tolerance;Pain;Increased muscle spasms;Decreased mobility;Decreased strength   Rehab Potential Excellent   PT Frequency 2x / week   PT Duration 6 weeks   PT Treatment/Interventions ADLs/Self Care Home Management;Moist Heat;Patient/family education;Passive range of motion;Therapeutic exercise;Traction;Ultrasound;Manual techniques;Cryotherapy;Neuromuscular re-education;Electrical Stimulation   PT Next Visit Plan Continue core strengthening. Advance HEP. Possible dynamic tape to bilat QL.    Consulted and Agree with Plan of Care Patient        Problem List Patient Active Problem List   Diagnosis Date Noted  . DDD (degenerative disc disease), lumbar 08/11/2014  . Muscle spasm of back 08/10/2014  . Mid back pain 08/10/2014  . Bilateral low back pain without sciatica 08/10/2014  . Viral syndrome 02/04/2014  . History of ovarian cancer 01/20/2014  . Gastric ulcer 09/14/2013  . Anemia, iron deficiency 09/09/2013  . Tachycardia 04/15/2012  . Obesity 05/31/2011  . ADD (attention deficit disorder) 03/01/2011  . OSA on CPAP 01/08/2011  . HYPERTENSION, MILD 01/09/2010  . EXTERNAL HEMORRHOIDS 01/09/2010  . ALLERGIC RHINITIS 01/09/2010   Kerin Perna, PTA 08/19/2014 12:58 PM  [place], [place], [address] Phone: [phone]   Fax:  [phone]

## 2014-08-24 ENCOUNTER — Ambulatory Visit (INDEPENDENT_AMBULATORY_CARE_PROVIDER_SITE_OTHER): Payer: BLUE CROSS/BLUE SHIELD | Admitting: Physical Therapy

## 2014-08-24 DIAGNOSIS — M545 Low back pain, unspecified: Secondary | ICD-10-CM

## 2014-08-24 DIAGNOSIS — M6281 Muscle weakness (generalized): Secondary | ICD-10-CM

## 2014-08-24 DIAGNOSIS — M546 Pain in thoracic spine: Secondary | ICD-10-CM

## 2014-08-24 NOTE — Patient Instructions (Signed)
Lumbar Side-Bend (All-Fours)   Tilt head and shoulder to right side, rotate same side hip toward head. Repeat _5___ times per set. Do __1__ sets per session. Do __1__ sessions per day.  http://orth.exer.us/244   Copyright  VHI. All rights reserved.  Healthy Back Strengthening - Back Extension on All Fours  Start on hands and knees, keeping them apart. Straighten right leg and left arm at the same time. Hold __5__ seconds. Switch immediately and repeat with left leg and right arm. Do __5__ times. Increase repetitions gradually up to __10__.   Copyright  VHI. All rights reserved.   [facility] at [facility] [place], [place] [address]  [phone] (office) [phone] (fax)

## 2014-08-24 NOTE — Therapy (Signed)
[place], [place], [address] Phone: [phone]   Fax:  [phone]  Physical Therapy Treatment  Patient Details  Name: Yolanda Mosley MRN: [id number] Date of Birth: [birth year] Referring Provider:  Donella Stade, PA-C  Encounter Date: 08/24/2014      PT End of Session - 08/24/14 1147    Visit Number 4   Number of Visits 12   Date for PT Re-Evaluation 09/24/14   PT Start Time 1147   PT Stop Time 1248   PT Time Calculation (min) 61 min   Activity Tolerance Patient tolerated treatment well  minimal pain with exercises      Past Medical History  Diagnosis Date  . CA - cancer 2005  . Hemorrhoids     laser tx   . Hypertension   . Asthma     Past Surgical History  Procedure Laterality Date  . Ovary, bilater fallopian tubes removed  2005    There were no vitals filed for this visit.  Visit Diagnosis:  Bilateral low back pain without sciatica  Pain in thoracic spine  Generalized muscle weakness      Subjective Assessment - 08/24/14 1147    Subjective Pt reports she has had some painfree days (Friday, Saturday).  Does lifting at work (up to 30#) Sunday/Monday and pain has returned. Sleeping better - would wake in night with 8-9/10 pain, now at 4-5/10.    Currently in Pain? Yes   Pain Score 3    Pain Location Back   Pain Orientation Lower;Left   Pain Descriptors / Indicators Aching   Aggravating Factors  lifting and bending    Pain Relieving Factors ice, medication             [facility] PT Assessment - 08/24/14 0001    Assessment   Medical Diagnosis mid back pain, LBP w/o sciatica, muscle spasms   Onset Date 07/26/14   Next MD Visit 09/24/14                     [facility] Adult PT Treatment/Exercise - 08/24/14 0001    Exercises   Exercises Lumbar   Lumbar Exercises: Aerobic   Stationary Bike NuStep L4 x 5 min   Lumbar Exercises: Supine   Ab Set 5 reps;3 seconds    Other Supine Lumbar Exercises Trans abd with hip out/in x 8 reps each way; trans abd with marching x 10; trans abd with heel slide (only completed 4 reps due to increased pain)     Lumbar Exercises: Sidelying   Clam 10 reps  2 sets each side    Other Sidelying Lumbar Exercises prolonged sidelying stretch over green bolster (with arm overhead x 2 min each side x 2 reps    Lumbar Exercises: Quadruped   Madcat/Old Horse 5 reps   Single Arm Raise 10 reps   Straight Leg Raise 10 reps   Straight Leg Raises Limitations no pain/ improved form.   Other Quadruped Lumbar Exercises Opp arm and leg (challenging) x 10 each leg.    Other Quadruped Lumbar Exercises childs pose x 20 sec; with lateral trunk flexion x 20 sec.    Modalities   Modalities Electrical Stimulation;Cryotherapy   Cryotherapy   Number Minutes Cryotherapy 15 Minutes   Cryotherapy Location Back   Type of Cryotherapy Ice pack   Electrical Stimulation   Electrical Stimulation Location lumbar paraspinals    Electrical Stimulation Action to tolerance    Electrical  Stimulation Parameters IFC 80-150 Hz   Electrical Stimulation Goals Pain   Manual Therapy   Manual Therapy Myofascial release;Other (comment)   Myofascial Release to bilateral QL    Other Manual Therapy TPR to bilat QL in sidelying                   PT Short Term Goals - 08/24/14 1244    PT SHORT TERM GOAL #1   Title i with initial hep   Time 2   Period Weeks   Status Achieved   PT SHORT TERM GOAL #2   Title decreased pain at night to 6/10 or less   Time 2   Period Weeks   Status Achieved           PT Long Term Goals - 08/12/14 1212    PT LONG TERM GOAL #1   Title I with advanced hep   Time 6   Period Weeks   Status New   PT LONG TERM GOAL #2   Title LBP of 1/10 or less with ADLS   Time 6   Period Weeks   Status New   PT LONG TERM GOAL #3   Title patient able to lift 50# floor to waist with no increase in pain   Time 6   Period Weeks    Status New   PT LONG TERM GOAL #4   Title Pt able to sleep through the night without waking from pain   Time 6   Period Weeks   Status New   PT LONG TERM GOAL #5   Title Improve FOTO to 26%   Time 6   Period Weeks   Status New               Plan - 08/24/14 1203    Clinical Impression Statement Pt able to tolerate hooklying position for ther ex / modalities (unable before today). Pt very tender with manual therapy to QL.   Reporting overall improvement in pain level; progressing towards goals. Has met STG #1 and 2.    Pt will benefit from skilled therapeutic intervention in order to improve on the following deficits Decreased range of motion;Postural dysfunction;Decreased activity tolerance;Pain;Increased muscle spasms;Decreased mobility;Decreased strength   Rehab Potential Excellent   PT Frequency 2x / week   PT Duration 6 weeks   PT Treatment/Interventions ADLs/Self Care Home Management;Moist Heat;Patient/family education;Passive range of motion;Therapeutic exercise;Traction;Ultrasound;Manual techniques;Cryotherapy;Neuromuscular re-education;Electrical Stimulation   PT Next Visit Plan Continue core strengthening. Advance HEP.  Trial Ktape/ dynamic tape for QL(?)   Consulted and Agree with Plan of Care Patient        Problem List Patient Active Problem List   Diagnosis Date Noted  . DDD (degenerative disc disease), lumbar 08/11/2014  . Muscle spasm of back 08/10/2014  . Mid back pain 08/10/2014  . Bilateral low back pain without sciatica 08/10/2014  . Viral syndrome 02/04/2014  . History of ovarian cancer 01/20/2014  . Gastric ulcer 09/14/2013  . Anemia, iron deficiency 09/09/2013  . Tachycardia 04/15/2012  . Obesity 05/31/2011  . ADD (attention deficit disorder) 03/01/2011  . OSA on CPAP 01/08/2011  . HYPERTENSION, MILD 01/09/2010  . EXTERNAL HEMORRHOIDS 01/09/2010  . ALLERGIC RHINITIS 01/09/2010    Kerin Perna, PTA 08/24/2014 12:44 PM  [place], [place], [address] Phone: [phone]   Fax:  [phone]

## 2014-08-26 ENCOUNTER — Ambulatory Visit (INDEPENDENT_AMBULATORY_CARE_PROVIDER_SITE_OTHER): Payer: BLUE CROSS/BLUE SHIELD | Admitting: Physical Therapy

## 2014-08-26 DIAGNOSIS — M6281 Muscle weakness (generalized): Secondary | ICD-10-CM

## 2014-08-26 DIAGNOSIS — M546 Pain in thoracic spine: Secondary | ICD-10-CM

## 2014-08-26 DIAGNOSIS — M545 Low back pain, unspecified: Secondary | ICD-10-CM

## 2014-08-26 NOTE — Therapy (Signed)
[place], [place], [address] Phone: [phone]   Fax:  [phone]  Physical Therapy Treatment  Patient Details  Name: Yolanda Mosley MRN: [id number] Date of Birth: 08-Sep-1980 Referring Provider:  Donella Stade, PA-C  Encounter Date: 08/26/2014      PT End of Session - 08/26/14 1145    Visit Number 5   Number of Visits 12   Date for PT Re-Evaluation 09/24/14   PT Start Time 1105   PT Stop Time 1153   PT Time Calculation (min) 48 min   Activity Tolerance Patient tolerated treatment well      Past Medical History  Diagnosis Date  . CA - cancer 2005  . Hemorrhoids     laser tx   . Hypertension   . Asthma     Past Surgical History  Procedure Laterality Date  . Ovary, bilater fallopian tubes removed  2005    There were no vitals filed for this visit.  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 Pain was up to 7/10; reduced with use of ice.    Currently in Pain? Yes   Pain Score 2    Pain Location Back   Pain Orientation Left   Pain Descriptors / Indicators Aching   Aggravating Factors  lifting and bending, prolonged standing    Pain Relieving Factors ice, medication                          [facility] Adult PT Treatment/Exercise - 08/26/14 0001    Exercises   Exercises Lumbar   Lumbar Exercises: Machines for Strengthening   Leg Press 7 plates x 10, 8 plates x 10 x 2   Lumbar Exercises: Standing   Functional Squats 10 reps  neutral spine; tandem stance, abd engaged.    Other Standing Lumbar Exercises sit to/from stand with abdominal set and glute/HS focus x 10.    Lumbar Exercises: Sidelying   Clam 20 reps  R/L    Other Sidelying Lumbar Exercises Hip flexion with IR to hip ext  x 10    Lumbar Exercises: Quadruped   Madcat/Old Horse 5 reps   Other Quadruped Lumbar Exercises childs pose x 20 sec; with lateral trunk flexion x 20 sec.    Modalities   Modalities Cryotherapy;Electrical Stimulation   Cryotherapy   Number Minutes Cryotherapy 12 Minutes   Cryotherapy Location Back   Type of Cryotherapy Ice pack   Electrical Stimulation   Electrical Stimulation Location lumbar paraspinals    Electrical Stimulation Action to tolerance    Electrical Stimulation Parameters IFC, 80-150 Hz   Electrical Stimulation Goals Pain   Manual Therapy   Manual Therapy Other (comment)   Other Manual Therapy Dynamic tape applied to Lt QL                 PT Education - 08/26/14 1149    Education provided Yes   Education Details Pt re-educated on limiting forward flexion with squating to decrease LBP. Return demo of squat with good form.    Person(s) Educated Patient   Methods Explanation;Demonstration   Comprehension Verbalized understanding;Returned demonstration          PT Short Term Goals - 08/24/14 1244  PT SHORT TERM GOAL #1   Title i with initial hep   Time 2   Period Weeks   Status Achieved   PT SHORT TERM GOAL #2   Title decreased pain at night to 6/10 or less   Time 2   Period Weeks   Status Achieved           PT Long Term Goals - 08/12/14 1212    PT LONG TERM GOAL #1   Title I with advanced hep   Time 6   Period Weeks   Status New   PT LONG TERM GOAL #2   Title LBP of 1/10 or less with ADLS   Time 6   Period Weeks   Status New   PT LONG TERM GOAL #3   Title patient able to lift 50# floor to waist with no increase in pain   Time 6   Period Weeks   Status New   PT LONG TERM GOAL #4   Title Pt able to sleep through the night without waking from pain   Time 6   Period Weeks   Status New   PT LONG TERM GOAL #5   Title Improve FOTO to 26%   Time 6   Period Weeks   Status New               Plan - 08/26/14 1151     Clinical Impression Statement Pt tolerated exercises without increase in pain. Pt reported after session she had some pain with hooklying position during modalities.    Pt will benefit from skilled therapeutic intervention in order to improve on the following deficits Decreased range of motion;Postural dysfunction;Decreased activity tolerance;Pain;Increased muscle spasms;Decreased mobility;Decreased strength   Rehab Potential Excellent   PT Frequency 2x / week   PT Duration 6 weeks   PT Treatment/Interventions ADLs/Self Care Home Management;Moist Heat;Patient/family education;Passive range of motion;Therapeutic exercise;Traction;Ultrasound;Manual techniques;Cryotherapy;Neuromuscular re-education;Electrical Stimulation   PT Next Visit Plan Continue core strengthening. Advance HEP.  Assess response to dynamic tape.    Consulted and Agree with Plan of Care Patient        Problem List Patient Active Problem List   Diagnosis Date Noted  . DDD (degenerative disc disease), lumbar 08/11/2014  . Muscle spasm of back 08/10/2014  . Mid back pain 08/10/2014  . Bilateral low back pain without sciatica 08/10/2014  . Viral syndrome 02/04/2014  . History of ovarian cancer 01/20/2014  . Gastric ulcer 09/14/2013  . Anemia, iron deficiency 09/09/2013  . Tachycardia 04/15/2012  . Obesity 05/31/2011  . ADD (attention deficit disorder) 03/01/2011  . OSA on CPAP 01/08/2011  . HYPERTENSION, MILD 01/09/2010  . EXTERNAL HEMORRHOIDS 01/09/2010  . ALLERGIC RHINITIS 01/09/2010    Kerin Perna, PTA 08/26/2014 12:10 PM  [place], [place], [address] Phone: [phone]   Fax:  [phone]

## 2014-08-31 ENCOUNTER — Ambulatory Visit (INDEPENDENT_AMBULATORY_CARE_PROVIDER_SITE_OTHER): Payer: BLUE CROSS/BLUE SHIELD | Admitting: Physical Therapy

## 2014-08-31 DIAGNOSIS — M545 Low back pain, unspecified: Secondary | ICD-10-CM

## 2014-08-31 DIAGNOSIS — M546 Pain in thoracic spine: Secondary | ICD-10-CM | POA: Diagnosis not present

## 2014-08-31 DIAGNOSIS — M6281 Muscle weakness (generalized): Secondary | ICD-10-CM | POA: Diagnosis not present

## 2014-08-31 NOTE — Patient Instructions (Signed)
Outer Hip Stretch: Reclined IT Band Stretch (Strap)   Strap around opposite foot, pull across only as far as possible with shoulders on mat. Hold for _5___ breaths. Repeat ___5_ times each leg.  Copyright  VHI. All rights reserved.  Hamstring Stretch, Seated (Strap, Two Chairs)   Sit with one leg extended onto facing chair. Lengthen spine. Tip forward from hips- back straight.  Hold for __5__ breaths. Repeat _2___ times each leg.  Copyright  VHI. All rights reserved.   [facility] at [facility] [address] [place], [place] [address]  [phone] (office) [phone] (fax)

## 2014-08-31 NOTE — Therapy (Signed)
[place], [place], [address] Phone: [phone]   Fax:  [phone]  Physical Therapy Treatment  Patient Details  Name: Yolanda Mosley MRN: [id number] Date of Birth: November 12, 1980 Referring Provider:  Donella Stade, PA-C  Encounter Date: 08/31/2014      PT End of Session - 08/31/14 1113    Visit Number 6   Number of Visits 12   Date for PT Re-Evaluation 09/24/14   PT Start Time 1106   PT Stop Time 1151   PT Time Calculation (min) 45 min      Past Medical History  Diagnosis Date  . CA - cancer 2005  . Hemorrhoids     laser tx   . Hypertension   . Asthma     Past Surgical History  Procedure Laterality Date  . Ovary, bilater fallopian tubes removed  2005    There were no vitals filed for this visit.  Visit Diagnosis:  Bilateral low back pain without sciatica  Pain in thoracic spine  Generalized muscle weakness      Subjective Assessment - 08/31/14 1117    Subjective Pt reported she was painfree when she left last session, after tape was applied.  Had painfree days for 2 days following. Able to tolerated supine with tape on back (at home).    Currently in Pain? Yes   Pain Score 1    Pain Location Back   Pain Orientation Left;Lower   Pain Descriptors / Indicators --  stiffness    Aggravating Factors  bending, laying on back    Pain Relieving Factors tape, ice, medication             [facility] PT Assessment - 08/31/14 0001    Assessment   Medical Diagnosis mid back pain, LBP w/o sciatica, muscle spasms   Onset Date 07/26/14   Next MD Visit 09/24/14                     [facility] Adult PT Treatment/Exercise - 08/31/14 0001    Lumbar Exercises: Stretches   Passive Hamstring Stretch 30 seconds;1 rep;2 reps   Passive Hamstring Stretch Limitations VC for form (performed with strap in supine and seated)    Lower Trunk Rotation 5 reps;10 seconds   ITB Stretch 4 reps;10  seconds  cross body stretch   Lumbar Exercises: Aerobic   Stationary Bike NuStep L5 x 5 min   Lumbar Exercises: Standing   Other Standing Lumbar Exercises box lift 20# floor to waist x 3; 30# floor to waist. Good mechanics/ no pain    Lumbar Exercises: Seated   Hip Flexion on Ball 20 reps  with opp arm to 90 deg    Other Seated Lumbar Exercises lean back with back straight back and core engaged; with arms to 90 deg flexion.    Lumbar Exercises: Supine   Bridge 10 reps;5 seconds  with ball squeeze;  with marching x 10 x 2    Lumbar Exercises: Quadruped   Madcat/Old Horse 5 reps   Straight Leg Raise 5 reps  body prone on therapy ball    Opposite Arm/Leg Raise 10 reps;5 seconds  challenge RtLE, L UE.    Cryotherapy   Type of Cryotherapy --  Pt stated would perform ice pack at home today.    Manual Therapy   Manual Therapy Other (comment)   Other Manual Therapy Dynamic tape applied to Lt QL  PT Education - 08/31/14 1240    Education provided Yes   Education Details HEP- added stretches    Person(s) Educated Patient   Methods Explanation;Handout   Comprehension Returned demonstration;Verbalized understanding          PT Short Term Goals - 08/24/14 1244    PT SHORT TERM GOAL #1   Title i with initial hep   Time 2   Period Weeks   Status Achieved   PT SHORT TERM GOAL #2   Title decreased pain at night to 6/10 or less   Time 2   Period Weeks   Status Achieved           PT Long Term Goals - 08/31/14 1253    PT LONG TERM GOAL #1   Title I with advanced hep   Time 6   Period Weeks   Status On-going   PT LONG TERM GOAL #2   Title LBP of 1/10 or less with ADLS   Time 6   Period Weeks   Status Partially Met   PT LONG TERM GOAL #3   Title patient able to lift 50# floor to waist with no increase in pain   Time 6   Period Weeks   Status On-going   PT LONG TERM GOAL #4   Title Pt able to sleep through the night without waking from pain    Time 6   Period Weeks   Status Partially Met   PT LONG TERM GOAL #5   Title Improve FOTO to 26%   Time 6   Period Weeks   Status On-going               Plan - 08/31/14 1217    Clinical Impression Statement Pt had prolonged pain relief with use of dynamic tape on Lt low back. Tolerated hooklying exercise with minimal increase in pain, and able to lift 30# without any production of pain. Continues with decreased core strength and has LB pain with posterior pelvic tilt.    Pt will benefit from skilled therapeutic intervention in order to improve on the following deficits Decreased range of motion;Postural dysfunction;Decreased activity tolerance;Pain;Increased muscle spasms;Decreased mobility;Decreased strength   Rehab Potential Excellent   PT Frequency 2x / week   PT Duration 6 weeks   PT Treatment/Interventions ADLs/Self Care Home Management;Moist Heat;Patient/family education;Passive range of motion;Therapeutic exercise;Traction;Ultrasound;Manual techniques;Cryotherapy;Neuromuscular re-education;Electrical Stimulation   PT Next Visit Plan Continue core strengthening. Test hip strength. Educate on application of dynamic tape for home use.    Consulted and Agree with Plan of Care Patient        Problem List Patient Active Problem List   Diagnosis Date Noted  . DDD (degenerative disc disease), lumbar 08/11/2014  . Muscle spasm of back 08/10/2014  . Mid back pain 08/10/2014  . Bilateral low back pain without sciatica 08/10/2014  . Viral syndrome 02/04/2014  . History of ovarian cancer 01/20/2014  . Gastric ulcer 09/14/2013  . Anemia, iron deficiency 09/09/2013  . Tachycardia 04/15/2012  . Obesity 05/31/2011  . ADD (attention deficit disorder) 03/01/2011  . OSA on CPAP 01/08/2011  . HYPERTENSION, MILD 01/09/2010  . EXTERNAL HEMORRHOIDS 01/09/2010  . ALLERGIC RHINITIS 01/09/2010    Kerin Perna, PTA 08/31/2014 12:54 PM  [place], [place], [address] Phone: [phone]   Fax:  [phone]

## 2014-09-02 ENCOUNTER — Ambulatory Visit (INDEPENDENT_AMBULATORY_CARE_PROVIDER_SITE_OTHER): Payer: BLUE CROSS/BLUE SHIELD | Admitting: Physical Therapy

## 2014-09-02 DIAGNOSIS — M546 Pain in thoracic spine: Secondary | ICD-10-CM

## 2014-09-02 DIAGNOSIS — M545 Low back pain, unspecified: Secondary | ICD-10-CM

## 2014-09-02 DIAGNOSIS — M6281 Muscle weakness (generalized): Secondary | ICD-10-CM | POA: Diagnosis not present

## 2014-09-02 NOTE — Patient Instructions (Signed)
Wood Chop: Sitting (Cable)   Grasp cable and rotate trunk by bringing hands toward opposite hip. Keep pelvis stable. Repeat to other side. Do __1__ sets. Complete __8__ repetitions.  Wood Chop: Reverse - Sitting (Cable)   Grasp cable and rotate trunk by bringing hands above opposite shoulder. Keep pelvis stable. Repeat to other side. Do _1_ sets. Complete _8___ repetitions.  Copyright  VHI. All rights reserved.     [facility] at [facility] [place],  [address]  [phone] (office) [phone] (fax)

## 2014-09-02 NOTE — Therapy (Signed)
[place], [place], [address] Phone: [phone]   Fax:  [phone]  Physical Therapy Treatment  Patient Details  Name: Yolanda Mosley MRN: [id number] Date of Birth: 19-Feb-1981 Referring Provider:  Donella Stade, PA-C  Encounter Date: 09/02/2014      PT End of Session - 09/02/14 1146    Visit Number 7   Number of Visits 12   Date for PT Re-Evaluation 09/24/14   PT Start Time 0300   PT Stop Time 1250   PT Time Calculation (min) 65 min   Activity Tolerance Patient tolerated treatment well  slight increase in pain with new exercises      Past Medical History  Diagnosis Date  . CA - cancer 2005  . Hemorrhoids     laser tx   . Hypertension   . Asthma     Past Surgical History  Procedure Laterality Date  . Ovary, bilater fallopian tubes removed  2005    There were no vitals filed for this visit.  Visit Diagnosis:  Bilateral low back pain without sciatica  Pain in thoracic spine  Generalized muscle weakness      Subjective Assessment - 09/02/14 1146    Subjective Pt reports she has had decreased pain with tape to back. Pt reports she is sleeping better at night; 75% improvement - she can sleep on back again.    Currently in Pain? Yes   Pain Score 1   1/10 in sitting; 0/10 in standing    Pain Location Back   Pain Orientation Left;Lower   Pain Descriptors / Indicators Aching;Dull   Aggravating Factors  touching affected area, laying on back.    Pain Relieving Factors tape, ice, medicine            [facility] PT Assessment - 09/02/14 0001    Assessment   Medical Diagnosis mid back pain, LBP w/o sciatica, muscle spasms   Onset Date 07/26/14   Next MD Visit 09/24/14   Strength   Overall Strength Comments Weak transverse Abd.   Strength Assessment Site Hip   Right/Left Hip Right;Left   Right Hip Flexion 5/5   Right Hip Extension 5/5   Right Hip ABduction 5/5   Left Hip Flexion  5/5   Left Hip Extension 5/5   Left Hip ABduction 5/5                     [facility] Adult PT Treatment/Exercise - 09/02/14 0001    Lumbar Exercises: Stretches   Passive Hamstring Stretch 2 reps;30 seconds   ITB Stretch 4 reps;30 seconds   Piriformis Stretch 2 reps;30 seconds  seated    Lumbar Exercises: Aerobic   Stationary Bike NuStep L5 x 5 min   Lumbar Exercises: Seated   Other Seated Lumbar Exercises wood chop sitting in chair with red band x 8 each side    Other Seated Lumbar Exercises Reverse wood chops with red band x 8 reps each side. Pt reported some increase in Lt back pain with this.    Lumbar Exercises: Supine   Bridge --  with marching x 6 steps x 2 sets - then trans abdominal set with hip in/out x 10 each leg, then with marching.    Lumbar Exercises: Prone   Other Prone Lumbar Exercises prone pelvic press with 5 sec hold x 15 reps; required tactile cues. Pt reported some increase in LBP.    Other Prone Lumbar Exercises Prone press  ups x 8 reps    Lumbar Exercises: Quadruped   Madcat/Old Horse 10 reps x 2 sets    Opposite Arm/Leg Raise 10 reps;Right arm/Left leg;Left arm/Right leg;5 seconds   Other Quadruped Lumbar Exercises childs pose x 25 sec x 3 reps   Cryotherapy   Number Minutes Cryotherapy 12 Minutes   Cryotherapy Location Back;Hip   Type of Cryotherapy Ice pack   Manual Therapy   Manual Therapy Other (comment)   Other Manual Therapy TPR to Rt piriformis, Lt piriformis and Lt QL. Deep pressure to Rt hip rotators with PROM of RLE. Dynamic tape applied to L QL in prone.                  PT Education - 09/02/14 1315    Education provided Yes   Education Details HEP   Person(s) Educated Patient   Methods Explanation;Handout   Comprehension Returned demonstration;Verbalized understanding          PT Short Term Goals - 08/24/14 1244    PT SHORT TERM GOAL #1   Title i with initial hep   Time 2   Period Weeks   Status Achieved   PT  SHORT TERM GOAL #2   Title decreased pain at night to 6/10 or less   Time 2   Period Weeks   Status Achieved           PT Long Term Goals - 09/02/14 1152    PT LONG TERM GOAL #1   Title I with advanced hep   Time 6   Period Weeks   Status On-going   PT LONG TERM GOAL #2   Title LBP of 1/10 or less with ADLS   Time 6   Period Weeks   Status Partially Met  2-3/10 reported on 09/02/14.   PT LONG TERM GOAL #3   Title patient able to lift 50# floor to waist with no increase in pain   Time 6   Period Weeks   Status On-going   PT LONG TERM GOAL #4   Title Pt able to sleep through the night without waking from pain   Time 6   Period Weeks   Status Partially Met   PT LONG TERM GOAL #5   Title Improve FOTO to 26%   Time 6   Period Weeks   Status On-going               Plan - 09/02/14 1226    Clinical Impression Statement Pt demo improved hip strength since last assessment. Pt continues to be point tender in Lt piriformis and QL; Rt piriformis.  Pt tolerated new exercises with minimal increase in pain (to 2-3/10)- resolved with rest.  Pt making good progress towards goals.    Pt will benefit from skilled therapeutic intervention in order to improve on the following deficits Decreased range of motion;Postural dysfunction;Decreased activity tolerance;Pain;Increased muscle spasms;Decreased mobility;Decreased strength   Rehab Potential Excellent   PT Frequency 2x / week   PT Duration 6 weeks   PT Treatment/Interventions ADLs/Self Care Home Management;Moist Heat;Patient/family education;Passive range of motion;Therapeutic exercise;Traction;Ultrasound;Manual techniques;Cryotherapy;Neuromuscular re-education;Electrical Stimulation   PT Next Visit Plan Continue core strengthening.  Educate on application of dynamic tape for home use.         Problem List Patient Active Problem List   Diagnosis Date Noted  . DDD (degenerative disc disease), lumbar 08/11/2014  . Muscle spasm  of back 08/10/2014  . Mid back pain 08/10/2014  . Bilateral  low back pain without sciatica 08/10/2014  . Viral syndrome 02/04/2014  . History of ovarian cancer 01/20/2014  . Gastric ulcer 09/14/2013  . Anemia, iron deficiency 09/09/2013  . Tachycardia 04/15/2012  . Obesity 05/31/2011  . ADD (attention deficit disorder) 03/01/2011  . OSA on CPAP 01/08/2011  . HYPERTENSION, MILD 01/09/2010  . EXTERNAL HEMORRHOIDS 01/09/2010  . ALLERGIC RHINITIS 01/09/2010   Kerin Perna, PTA 09/02/2014 1:19 PM  [facility] [place], [place], [address] Phone: [phone]   Fax:  [phone]

## 2014-09-06 ENCOUNTER — Encounter: Payer: Self-pay | Admitting: Obstetrics & Gynecology

## 2014-09-06 ENCOUNTER — Ambulatory Visit (INDEPENDENT_AMBULATORY_CARE_PROVIDER_SITE_OTHER): Payer: BLUE CROSS/BLUE SHIELD | Admitting: Obstetrics & Gynecology

## 2014-09-06 VITALS — Ht 64.0 in | Wt 208.0 lb

## 2014-09-06 DIAGNOSIS — N83202 Unspecified ovarian cyst, left side: Secondary | ICD-10-CM

## 2014-09-06 DIAGNOSIS — N8320 Unspecified ovarian cysts: Secondary | ICD-10-CM

## 2014-09-06 NOTE — Progress Notes (Signed)
   Subjective:    Patient ID: Yolanda Mosley, female    DOB: 1980/12/07, 34 y.o.   MRN: [id number]  HPI 34 yo SW G0 here today as a referral from her physical therapist. She is being treated there for LBP and PT wanted to make sure that she was UPT with her gyn and cleared for PT work.   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 Both of her fallopian tubes were removed.  She gets recurrent hemorrhagic ovarian cysts on her remaining left ovary. She was seen at [facility] until she has had an outstanding balance.   They put her on OCPs to regulate her periods which are otherwise irregular. However, the OCPs led to an increase in her BP so she stopped the OCPs about 6 months ago.   Review of Systems Her pap was normal in 2015 and she denies a h/o abnormal paps.    Objective:   Physical Exam Moderately obese WF NAD Breathing and ambulating normally Abd- benign EG, vagina- normal NSSA, NT uterus, left adnexa about 4-5 cm and somewhat tender       Assessment & Plan:  H/o hemorrhagic cysts- I do feel a cyst on exam and will get an u/s for a baseline RTC prn

## 2014-09-07 ENCOUNTER — Ambulatory Visit (INDEPENDENT_AMBULATORY_CARE_PROVIDER_SITE_OTHER): Payer: BLUE CROSS/BLUE SHIELD

## 2014-09-07 ENCOUNTER — Ambulatory Visit (INDEPENDENT_AMBULATORY_CARE_PROVIDER_SITE_OTHER): Payer: BLUE CROSS/BLUE SHIELD | Admitting: Physical Therapy

## 2014-09-07 DIAGNOSIS — N83202 Unspecified ovarian cyst, left side: Secondary | ICD-10-CM

## 2014-09-07 DIAGNOSIS — M6281 Muscle weakness (generalized): Secondary | ICD-10-CM

## 2014-09-07 DIAGNOSIS — N8320 Unspecified ovarian cysts: Secondary | ICD-10-CM

## 2014-09-07 DIAGNOSIS — M545 Low back pain, unspecified: Secondary | ICD-10-CM

## 2014-09-07 DIAGNOSIS — M546 Pain in thoracic spine: Secondary | ICD-10-CM | POA: Diagnosis not present

## 2014-09-07 NOTE — Therapy (Signed)
[place], [place], [address] Phone: [phone]   Fax:  [phone]  Physical Therapy Treatment  Patient Details  Name: Yolanda Mosley MRN: [id number] Date of Birth: 1980/05/11 Referring Provider:  Donella Stade, PA-C  Encounter Date: 09/07/2014      PT End of Session - 09/07/14 1021    Visit Number 8   Number of Visits 12   Date for PT Re-Evaluation 09/24/14   PT Start Time 1017   PT Stop Time 1108   PT Time Calculation (min) 51 min   Activity Tolerance Patient tolerated treatment well;No increased pain      Past Medical History  Diagnosis Date  . CA - cancer 2005    Germ Cell Tumor - Rt Ovary  . Hemorrhoids     laser tx   . Hypertension   . Asthma   . Herpes simplex virus (HSV) infection     Past Surgical History  Procedure Laterality Date  . Ovary, bilater fallopian tubes removed  2005    There were no vitals filed for this visit.  Visit Diagnosis:  Bilateral low back pain without sciatica  Pain in thoracic spine  Generalized muscle weakness      Subjective Assessment - 09/07/14 1021    Subjective Pt reports back pain has decreased with dynamic tape in place, but Rt hip "catching" and experiencing sharp pain with walking.    Currently in Pain? Yes   Pain Score 3    Pain Location Hip   Pain Orientation Right   Pain Descriptors / Indicators Sharp   Aggravating Factors  walking    Pain Relieving Factors ?   Multiple Pain Sites Yes   Pain Score 2   Pain Location Back   Pain Orientation Lower   Pain Descriptors / Indicators Burning            [facility] PT Assessment - 09/07/14 0001    Assessment   Medical Diagnosis mid back pain, LBP w/o sciatica, muscle spasms   Onset Date 07/26/14   Next MD Visit 09/24/14                     [facility] Adult PT Treatment/Exercise - 09/07/14 0001    Lumbar Exercises: Stretches   Active Hamstring Stretch --  10 reps each  side; swinging leg    Passive Hamstring Stretch 2 reps;30 seconds   Piriformis Stretch 2 reps;30 seconds  seated    Lumbar Exercises: Aerobic   Stationary Bike NuStep L5 x 5 min   Lumbar Exercises: Standing   Other Standing Lumbar Exercises side stepping with green band at ankles x 10 ft x 4    Other Standing Lumbar Exercises box lift  floor to waist: 37# x 2, 47 # x 1, 50# x 1 rep ( only 2/10 pain with initial lift- no pain 50 #)   Lumbar Exercises: Sidelying   Other Sidelying Lumbar Exercises hip flexion to ext (donkey kick) x 15 each side.    Other Sidelying Lumbar Exercises modified side plank hip lifts x 5 reps (challenge!)   Lumbar Exercises: Prone   Other Prone Lumbar Exercises plank x 10 sec x 2 reps; required VC and visual cue for proper form (ant pelvic tilt)    Lumbar Exercises: Quadruped   Madcat/Old Horse 10 reps   Opposite Arm/Leg Raise 10 reps;Right arm/Left leg;Left arm/Right leg;5 seconds  On green therapy ball (challenge!)   Cryotherapy  Number Minutes Cryotherapy 12 Minutes   Cryotherapy Location Back;Hip   Type of Cryotherapy Ice pack                  PT Short Term Goals - 08/24/14 1244    PT SHORT TERM GOAL #1   Title i with initial hep   Time 2   Period Weeks   Status Achieved   PT SHORT TERM GOAL #2   Title decreased pain at night to 6/10 or less   Time 2   Period Weeks   Status Achieved           PT Long Term Goals - 09/07/14 1031    PT LONG TERM GOAL #1   Title I with advanced hep   Time 6   Period Weeks   Status On-going   PT LONG TERM GOAL #2   Title LBP of 1/10 or less with ADLS   Time 6   Period Weeks   Status Partially Met   PT LONG TERM GOAL #3   Title patient able to lift 50# floor to waist with no increase in pain   Time 6   Period Weeks   Status Achieved   PT LONG TERM GOAL #4   Title Pt able to sleep through the night without waking from pain   Time 6   Period Weeks   Status Partially Met   PT LONG TERM GOAL  #5   Title Improve FOTO to 26%   Time 6   Period Weeks   Status On-going               Plan - 09/07/14 1258    Clinical Impression Statement Pt tolerated exercises with mild increase in pain in hips with exercise; reduced with stretches and cryotherapy. Pt tolerating ADLs with reduced pain of 1/10 with dynamic tape on back; will assess pain level without tape in place over next 2 days. Met LTG#3   Pt will benefit from skilled therapeutic intervention in order to improve on the following deficits Decreased range of motion;Postural dysfunction;Decreased activity tolerance;Pain;Increased muscle spasms;Decreased mobility;Decreased strength   Rehab Potential Excellent   PT Frequency 2x / week   PT Duration 6 weeks   PT Treatment/Interventions ADLs/Self Care Home Management;Moist Heat;Patient/family education;Passive range of motion;Therapeutic exercise;Traction;Ultrasound;Manual techniques;Cryotherapy;Neuromuscular re-education;Electrical Stimulation   PT Next Visit Plan Continue core/hip strengthening. Holding off on modalities to low back until results from diagnostic US. Will continue use of dynamic tape prn.    Consulted and Agree with Plan of Care Patient        Problem List Patient Active Problem List   Diagnosis Date Noted  . DDD (degenerative disc disease), lumbar 08/11/2014  . Muscle spasm of back 08/10/2014  . Mid back pain 08/10/2014  . Bilateral low back pain without sciatica 08/10/2014  . Viral syndrome 02/04/2014  . History of ovarian cancer 01/20/2014  . Gastric ulcer 09/14/2013  . Anemia, iron deficiency 09/09/2013  . Tachycardia 04/15/2012  . Obesity 05/31/2011  . ADD (attention deficit disorder) 03/01/2011  . OSA on CPAP 01/08/2011  . HYPERTENSION, MILD 01/09/2010  . EXTERNAL HEMORRHOIDS 01/09/2010  . ALLERGIC RHINITIS 01/09/2010   Kerin Perna, PTA 09/07/2014 1:03 PM  [place]  [place], [place], [address] Phone: [phone]   Fax:  [phone]

## 2014-09-09 ENCOUNTER — Ambulatory Visit (INDEPENDENT_AMBULATORY_CARE_PROVIDER_SITE_OTHER): Payer: BLUE CROSS/BLUE SHIELD | Admitting: Physical Therapy

## 2014-09-09 DIAGNOSIS — M546 Pain in thoracic spine: Secondary | ICD-10-CM

## 2014-09-09 DIAGNOSIS — M6281 Muscle weakness (generalized): Secondary | ICD-10-CM | POA: Diagnosis not present

## 2014-09-09 DIAGNOSIS — M545 Low back pain, unspecified: Secondary | ICD-10-CM

## 2014-09-09 NOTE — Therapy (Signed)
[place], [place], [address] Phone: [phone]   Fax:  [phone]  Physical Therapy Treatment  Patient Details  Name: Yolanda Mosley MRN: [id number] Date of Birth: [birth year] Referring Provider:  Dene Gentry, MD  Encounter Date: 09/09/2014      PT End of Session - 09/09/14 0937    Visit Number 9   Number of Visits 12   Date for PT Re-Evaluation 09/24/14   PT Start Time 0935   PT Stop Time 1028   PT Time Calculation (min) 53 min      Past Medical History  Diagnosis Date  . CA - cancer 2005    Germ Cell Tumor - Rt Ovary  . Hemorrhoids     laser tx   . Hypertension   . Asthma   . Herpes simplex virus (HSV) infection     Past Surgical History  Procedure Laterality Date  . Ovary, bilater fallopian tubes removed  2005    There were no vitals filed for this visit.  Visit Diagnosis:  Bilateral low back pain without sciatica  Pain in thoracic spine  Generalized muscle weakness      Subjective Assessment - 09/09/14 0937    Subjective Pt report back pain has stopped, but sides are hurting from down stacking 30# boxes from ladder and walking with it.    Currently in Pain? Yes   Pain Score 4    Pain Location Flank   Pain Orientation Right;Left   Pain Descriptors / Indicators Dull   Aggravating Factors  lifting    Pain Relieving Factors ice             [facility] PT Assessment - 09/09/14 0001    Assessment   Medical Diagnosis mid back pain, LBP w/o sciatica, muscle spasms   Onset Date 07/26/14   Next MD Visit 09/24/14                     [facility] Adult PT Treatment/Exercise - 09/09/14 0001    Lumbar Exercises: Stretches   Double Knee to Chest Stretch 2 reps;20 seconds   Lower Trunk Rotation 5 reps;20 seconds   Lumbar Exercises: Aerobic   Stationary Bike NuStep L5 x 5 min   Lumbar Exercises: Sidelying   Other Sidelying Lumbar Exercises thoracic rotation with 2# in  hand x 10 reps each side.    Other Sidelying Lumbar Exercises sidelying over green therapy ball for side stretch x 20 sec x 2 reps each side    Lumbar Exercises: Prone   Other Prone Lumbar Exercises plank x 4 reps x 10 sec (challenge)    Lumbar Exercises: Quadruped   Madcat/Old Horse 10 reps  2 sets    Opposite Arm/Leg Raise 10 reps;Right arm/Left leg;Left arm/Right leg;5 seconds  On green therapy ball (challenge!)   Other Quadruped Lumbar Exercises childs pose x 30 sec    Cryotherapy   Number Minutes Cryotherapy 12 Minutes   Cryotherapy Location Back;Hip   Type of Cryotherapy Ice pack   Manual Therapy   Manual Therapy Other (comment)   Other Manual Therapy TPR to Rt hip rotators with gentle oscillation and contract relax, dynamic tape applied to Rt piriformis and glute med to decrease pain.                   PT Short Term Goals - 08/24/14 1244    PT SHORT TERM GOAL #1   Title i with initial  hep   Time 2   Period Weeks   Status Achieved   PT SHORT TERM GOAL #2   Title decreased pain at night to 6/10 or less   Time 2   Period Weeks   Status Achieved           PT Long Term Goals - 09/07/14 1031    PT LONG TERM GOAL #1   Title I with advanced hep   Time 6   Period Weeks   Status On-going   PT LONG TERM GOAL #2   Title LBP of 1/10 or less with ADLS   Time 6   Period Weeks   Status Partially Met   PT LONG TERM GOAL #3   Title patient able to lift 50# floor to waist with no increase in pain   Time 6   Period Weeks   Status Achieved   PT LONG TERM GOAL #4   Title Pt able to sleep through the night without waking from pain   Time 6   Period Weeks   Status Partially Met   PT LONG TERM GOAL #5   Title Improve FOTO to 26%   Time 6   Period Weeks   Status On-going               Problem List Patient Active Problem List   Diagnosis Date Noted  . DDD (degenerative disc disease), lumbar 08/11/2014  . Muscle spasm of back 08/10/2014  . Mid back  pain 08/10/2014  . Bilateral low back pain without sciatica 08/10/2014  . Viral syndrome 02/04/2014  . History of ovarian cancer 01/20/2014  . Gastric ulcer 09/14/2013  . Anemia, iron deficiency 09/09/2013  . Tachycardia 04/15/2012  . Obesity 05/31/2011  . ADD (attention deficit disorder) 03/01/2011  . OSA on CPAP 01/08/2011  . HYPERTENSION, MILD 01/09/2010  . EXTERNAL HEMORRHOIDS 01/09/2010  . ALLERGIC RHINITIS 01/09/2010    Kerin Perna, PTA 09/09/2014 2:05 PM  [place], [place], [address] Phone: [phone]   Fax:  [phone]

## 2014-09-15 ENCOUNTER — Other Ambulatory Visit: Payer: Self-pay | Admitting: Family Medicine

## 2014-09-21 ENCOUNTER — Ambulatory Visit (INDEPENDENT_AMBULATORY_CARE_PROVIDER_SITE_OTHER): Payer: BLUE CROSS/BLUE SHIELD | Admitting: Physical Therapy

## 2014-09-21 DIAGNOSIS — M546 Pain in thoracic spine: Secondary | ICD-10-CM

## 2014-09-21 DIAGNOSIS — M545 Low back pain, unspecified: Secondary | ICD-10-CM

## 2014-09-21 DIAGNOSIS — M6281 Muscle weakness (generalized): Secondary | ICD-10-CM

## 2014-09-21 NOTE — Therapy (Addendum)
[place], [place], [address] Phone: [phone]   Fax:  [phone]  Physical Therapy Treatment  Patient Details  Name: Yolanda Mosley MRN: [id number] Date of Birth: [birth year] Referring Provider:  Donella Stade, PA-C  Encounter Date: 09/21/2014      PT End of Session - 09/21/14 1107    Visit Number 10   Number of Visits 12   Date for PT Re-Evaluation 09/24/14   PT Start Time 1105   PT Stop Time 1148   PT Time Calculation (min) 43 min   Activity Tolerance Patient tolerated treatment well  mild thoracic paraspinal pain with exercise; reduced with ice       Past Medical History  Diagnosis Date  . CA - cancer 2005    Germ Cell Tumor - Rt Ovary  . Hemorrhoids     laser tx   . Hypertension   . Asthma   . Herpes simplex virus (HSV) infection     Past Surgical History  Procedure Laterality Date  . Ovary, bilater fallopian tubes removed  2005    There were no vitals filed for this visit.  Visit Diagnosis:  Pain in thoracic spine  Bilateral low back pain without sciatica  Generalized muscle weakness      Subjective Assessment - 09/21/14 1107    Subjective Pt was at beach last week; overall no pain.  Returned to work yesterday, had one moment of pain in Rt hip/L LB with bending/stooping but resolved within seconds. Still has pain of 2-3/10 with sleep, but improved from 8/10   Currently in Pain? No/denies            [facility] PT Assessment - 09/21/14 0001    Assessment   Medical Diagnosis mid back pain, LBP w/o sciatica, muscle spasms   Onset Date/Surgical Date 07/26/14   Next MD Visit 09/24/14   Observation/Other Assessments   Focus on Therapeutic Outcomes (FOTO)  24% limitation                      [facility] Adult PT Treatment/Exercise - 09/21/14 0001    Lumbar Exercises: Aerobic   Stationary Bike NuStep L5 x 5 min   Lumbar Exercises: Standing   Other Standing Lumbar  Exercises wood chop with green back x 10 each side.    Other Standing Lumbar Exercises side stepping with green band at ankles x 20 feet x 4 reps    Lumbar Exercises: Seated   Other Seated Lumbar Exercises reverse wood chop with 3#  x 10 reps each side x 2 sets    Lumbar Exercises: Sidelying   Other Sidelying Lumbar Exercises side plank x 5 sec x 2 reps each side    Lumbar Exercises: Prone   Other Prone Lumbar Exercises High plank x 15 sec x 3 reps - demo and VC for form    Lumbar Exercises: Quadruped   Madcat/Old Horse 10 reps   Other Quadruped Lumbar Exercises childs pose x 30 sec with lateral trunk flexion x 2 reps each side.    Cryotherapy   Number Minutes Cryotherapy 12 Minutes   Cryotherapy Location --  thoracic back    Type of Cryotherapy Ice pack                PT Education - 09/21/14 1332    Education provided Yes   Education Details Pt issued green band for advancement of current HEP.    Person(s) Educated  Patient   Methods Demonstration;Explanation   Comprehension Returned demonstration;Verbalized understanding          PT Short Term Goals - 08/24/14 1244    PT SHORT TERM GOAL #1   Title i with initial hep   Time 2   Period Weeks   Status Achieved   PT SHORT TERM GOAL #2   Title decreased pain at night to 6/10 or less   Time 2   Period Weeks   Status Achieved           PT Long Term Goals - 09/21/14 1111    PT LONG TERM GOAL #1   Title I with advanced hep   Time 6   Period Weeks   Status Achieved   PT LONG TERM GOAL #2   Title LBP of 1/10 or less with ADLS   Time 6   Period Weeks   Status Achieved   PT LONG TERM GOAL #3   Title patient able to lift 50# floor to waist with no increase in pain   Time 6   Period Weeks   Status Achieved   PT LONG TERM GOAL #4   Title Pt able to sleep through the night without waking from pain   Time 6   Period Weeks   Status Partially Met   PT LONG TERM GOAL #5   Title Improve FOTO to 26%   Time 6    Period Weeks   Status Achieved  24%   Additional Long Term Goals   Additional Long Term Goals Yes               Plan - 09/21/14 1329    Clinical Impression Statement Pt reported mild increase in pain in thoracic spine with rotational exercises; resolved with use of ice at end of treatment.  Pt has met all but one goal and is satisfied with current level of function. Pt reports desire to d/c to HEP.    Pt will benefit from skilled therapeutic intervention in order to improve on the following deficits Decreased range of motion;Postural dysfunction;Decreased activity tolerance;Pain;Increased muscle spasms;Decreased mobility;Decreased strength   Rehab Potential Excellent   PT Treatment/Interventions ADLs/Self Care Home Management;Moist Heat;Patient/family education;Passive range of motion;Therapeutic exercise;Traction;Ultrasound;Manual techniques;Cryotherapy;Neuromuscular re-education;Electrical Stimulation   PT Next Visit Plan Spoke to supervising PT regarding pt's progress and her desire to d/c.     Consulted and Agree with Plan of Care Patient        Problem List Patient Active Problem List   Diagnosis Date Noted  . DDD (degenerative disc disease), lumbar 08/11/2014  . Muscle spasm of back 08/10/2014  . Mid back pain 08/10/2014  . Bilateral low back pain without sciatica 08/10/2014  . Viral syndrome 02/04/2014  . History of ovarian cancer 01/20/2014  . Gastric ulcer 09/14/2013  . Anemia, iron deficiency 09/09/2013  . Tachycardia 04/15/2012  . Obesity 05/31/2011  . ADD (attention deficit disorder) 03/01/2011  . OSA on CPAP 01/08/2011  . HYPERTENSION, MILD 01/09/2010  . EXTERNAL HEMORRHOIDS 01/09/2010  . ALLERGIC RHINITIS 01/09/2010   Kerin Perna, PTA 09/21/2014 1:34 PM   [facility] [place], [place], [address] Phone: [phone]   Fax:  [phone]     PHYSICAL THERAPY DISCHARGE  SUMMARY  Visits from Start of Care: 10  Current functional level related to goals / functional outcomes: See above   Remaining deficits: Some difficulty with sleeping through the night   Education / Equipment: HEP  Plan: Patient agrees to discharge.  Patient goals were partially met. Patient is being discharged due to being pleased with the current functional level.  ?????   Jeral Pinch, PT 09/23/2014 10:16 AM

## 2014-09-23 ENCOUNTER — Encounter: Payer: BLUE CROSS/BLUE SHIELD | Admitting: Physical Therapy

## 2014-09-28 ENCOUNTER — Ambulatory Visit: Payer: BLUE CROSS/BLUE SHIELD | Admitting: Family Medicine

## 2014-09-28 ENCOUNTER — Other Ambulatory Visit: Payer: Self-pay | Admitting: Physician Assistant

## 2014-10-06 ENCOUNTER — Other Ambulatory Visit: Payer: Self-pay | Admitting: Family Medicine

## 2014-10-09 ENCOUNTER — Other Ambulatory Visit: Payer: Self-pay | Admitting: Family Medicine

## 2014-11-01 ENCOUNTER — Other Ambulatory Visit: Payer: Self-pay | Admitting: Family Medicine

## 2014-11-03 ENCOUNTER — Other Ambulatory Visit: Payer: Self-pay | Admitting: Family Medicine

## 2014-11-12 ENCOUNTER — Ambulatory Visit (INDEPENDENT_AMBULATORY_CARE_PROVIDER_SITE_OTHER): Payer: BLUE CROSS/BLUE SHIELD | Admitting: Family Medicine

## 2014-11-12 ENCOUNTER — Encounter: Payer: Self-pay | Admitting: Family Medicine

## 2014-11-12 VITALS — BP 140/85 | HR 99 | Ht 64.0 in | Wt 212.0 lb

## 2014-11-12 DIAGNOSIS — R21 Rash and other nonspecific skin eruption: Secondary | ICD-10-CM | POA: Diagnosis not present

## 2014-11-12 MED ORDER — MUPIROCIN 2 % EX OINT: TOPICAL_OINTMENT | Freq: Two times a day (BID) | CUTANEOUS | Status: DC

## 2014-11-12 NOTE — Progress Notes (Signed)
   Subjective:    Patient ID: Laurell Josephs, female    DOB: Dec 22, 1980, 34 y.o.   MRN: [id number]  HPI Noticed a bump on her low mid back about a week ago. Then noticed a few more lesions.  No pustules or vesicles.  Now has a total of 7 lesions. She says it's extremely itchy and yet burning and tender at the same time. No drainage from the lesions per she has not been applying anything over-the-counter or topically. No fevers chills or sweats. She does have a history of genital herpes as well as cold sores on her mouth.   Review of Systems     Objective:   Physical Exam  Constitutional: She appears well-developed and well-nourished.  HENT:  Head: Normocephalic and atraumatic.  Skin: Skin is warm and dry. Rash noted.  She has several smaller erythematous lesion on mid low back.  No pustules or vesicles.   Psychiatric: She has a normal mood and affect. Her behavior is normal.          Assessment & Plan:   rash-suspect herpes simplex based on the appearance of the rash and her description of her symptoms. Encouraged her to increase her is likely severe to 3 times a day instead of once a day. Will have her apply mupirocin ointment. Call if not at least 50% better in one week. Nothing that I can really culture.

## 2014-11-12 NOTE — Patient Instructions (Signed)
Increase acyclovir three times a day for 5 days then ok to decrease back down to once a day.  Put the ointment on it twice a day.

## 2014-11-26 ENCOUNTER — Other Ambulatory Visit: Payer: Self-pay

## 2014-11-26 MED ORDER — LISDEXAMFETAMINE DIMESYLATE 70 MG PO CAPS: 70.0000 mg | ORAL_CAPSULE | ORAL | Status: DC

## 2014-11-26 NOTE — Telephone Encounter (Signed)
Left message for patient to schedule a follow up appointment before more refills.

## 2014-11-28 ENCOUNTER — Other Ambulatory Visit: Payer: Self-pay | Admitting: Family Medicine

## 2014-12-05 ENCOUNTER — Other Ambulatory Visit: Payer: Self-pay | Admitting: Physician Assistant

## 2014-12-07 ENCOUNTER — Encounter: Payer: Self-pay | Admitting: Family Medicine

## 2014-12-07 ENCOUNTER — Ambulatory Visit (INDEPENDENT_AMBULATORY_CARE_PROVIDER_SITE_OTHER): Payer: BLUE CROSS/BLUE SHIELD | Admitting: Family Medicine

## 2014-12-07 ENCOUNTER — Ambulatory Visit: Payer: BLUE CROSS/BLUE SHIELD | Admitting: Family Medicine

## 2014-12-07 VITALS — BP 124/84 | HR 84 | Ht 64.0 in | Wt 211.0 lb

## 2014-12-07 DIAGNOSIS — F909 Attention-deficit hyperactivity disorder, unspecified type: Secondary | ICD-10-CM | POA: Diagnosis not present

## 2014-12-07 DIAGNOSIS — B3731 Acute candidiasis of vulva and vagina: Secondary | ICD-10-CM

## 2014-12-07 DIAGNOSIS — B373 Candidiasis of vulva and vagina: Secondary | ICD-10-CM

## 2014-12-07 DIAGNOSIS — F988 Other specified behavioral and emotional disorders with onset usually occurring in childhood and adolescence: Secondary | ICD-10-CM

## 2014-12-07 DIAGNOSIS — R252 Cramp and spasm: Secondary | ICD-10-CM

## 2014-12-07 MED ORDER — METHYLPHENIDATE HCL ER (OSM) 54 MG PO TBCR: 54.0000 mg | EXTENDED_RELEASE_TABLET | ORAL | Status: DC

## 2014-12-07 MED ORDER — FLUCONAZOLE 150 MG PO TABS: 150.0000 mg | ORAL_TABLET | Freq: Once | ORAL | Status: DC

## 2014-12-07 NOTE — Progress Notes (Signed)
Subjective:    Patient ID: Yolanda Mosley, female    DOB: 02-Apr-1981, 34 y.o.   MRN: [id number]  HPI ADD f/u - vyvanse price has going way up. She has a really high deductible for her insurance.  She would like to see if there something that might be less expensive. She tried Adderall XR one point time but felt like it wasn't very effective. She has never taken Concerta but says her brother was on it at one time and did well with it.  She now has a yeast infection from an antibiotic she has take for a dental infection.  X 3 days of vaginal itching and sxs.    Getting leg cramps esp at night.  Has been trying to eat bananas but it doesn't seem to really be helping. The only time it really happens during the day is that she goes to being which she does frequently. She says that time they get into the run she'll notice cramping. In the muscles will stay sore for a day or 2 afterwards. She denies any recent changes to exercise. She feels like she hydrates well. No new medication changes.     Review of Systems BP 124/84 mmHg  Pulse 84  Ht 5\' 4"  (1.626 m)  Wt 211 lb (95.709 kg)  BMI 36.20 kg/m2  SpO2 97%    Allergies  Allergen Reactions  . Codeine Nausea Only  . Tape Rash    Past Medical History  Diagnosis Date  . CA - cancer 2005    Germ Cell Tumor - Rt Ovary  . Hemorrhoids     laser tx   . Hypertension   . Asthma   . Herpes simplex virus (HSV) infection     Past Surgical History  Procedure Laterality Date  . Ovary, bilater fallopian tubes removed  2005    History   Social History  . Marital Status: Single    Spouse Name: N/A  . Number of Children: N/A  . Years of Education: N/A   Occupational History  . Not on file.   Social History Main Topics  . Smoking status: Never Smoker   . Smokeless tobacco: Not on file  . Alcohol Use: Yes     Comment: occasionally  . Drug Use: No  . Sexual Activity: Not on file   Other Topics Concern  . Not on file   Social History  Narrative    Family History  Problem Relation Age of Onset  . Hypertension Mother   . Diabetes Other   . Other Other     cardiovascular disorder    Outpatient Encounter Prescriptions as of 12/07/2014  Medication Sig  . acyclovir (ZOVIRAX) 400 MG tablet TAKE 1 TABLET (400 MG TOTAL) BY MOUTH 2 (TWO) TIMES DAILY.  [facility] Kitchen atenolol (TENORMIN) 50 MG tablet TAKE 1 TABLET (50 MG TOTAL) BY MOUTH DAILY.  . benazepril-hydrochlorthiazide (LOTENSIN HCT) 10-12.5 MG per tablet TAKE 1 TABLET BY MOUTH EVERY DAY  . [facility] ALLERGY RELIEF 180 MG tablet TAKE 1 TABLET BY MOUTH EVERY DAY  . omeprazole (PRILOSEC) 40 MG capsule   . PROAIR HFA 108 (90 BASE) MCG/ACT inhaler INHALE 2 PUFFS INTO THE LUNGS EVERY 6 HOURS AS NEEDED  . [DISCONTINUED] lisdexamfetamine (VYVANSE) 70 MG capsule Take 1 capsule (70 mg total) by mouth every morning. Do not refill until 08/08/14  . fluconazole (DIFLUCAN) 150 MG tablet Take 1 tablet (150 mg total) by mouth once.  . methylphenidate 54 MG PO CR tablet Take  1 tablet (54 mg total) by mouth every morning.  . [DISCONTINUED] atenolol (TENORMIN) 50 MG tablet TAKE 1 TABLET (50 MG TOTAL) BY MOUTH DAILY. PT WILL NEED AN APPT BEFORE FUTURE REFILLS  . [DISCONTINUED] mupirocin ointment (BACTROBAN) 2 % Apply topically 2 (two) times daily. X 10 days  . [DISCONTINUED] Olopatadine HCl 0.2 % SOLN Apply 1 drop to eye daily.   No facility-administered encounter medications on file as of 12/07/2014.           Objective:   Physical Exam  Constitutional: She is oriented to person, place, and time. She appears well-developed and well-nourished.  HENT:  Head: Normocephalic and atraumatic.  Cardiovascular: Normal rate, regular rhythm and normal heart sounds.   Pulmonary/Chest: Effort normal and breath sounds normal.  Neurological: She is alert and oriented to person, place, and time.  Skin: Skin is warm and dry.  Psychiatric: She has a normal mood and affect. Her behavior is normal.           Assessment & Plan:  ADD -  discussed options. Unfortunately with a high deductible she have to pay cash price. Once she meets her deductible actually looks like Vyvanse is the preferred drug on her formulary being added to 2 level. We'll try switching to Concerta since it does have a generic available. Prescription printed. We'll start with a higher dose.  Yeast vaginitis-we'll treat with Diflucan. If symptoms persist then come back in for a wet prep for confirmation.  Leg cramps-recommend a trial of B6 30 mg once a day. Make sure hydrating well. Stretch out really well.

## 2014-12-07 NOTE — Patient Instructions (Signed)
Vitamin B6 - 30 mg once a day for leg cramp.

## 2014-12-17 ENCOUNTER — Other Ambulatory Visit: Payer: Self-pay | Admitting: Family Medicine

## 2014-12-22 ENCOUNTER — Other Ambulatory Visit: Payer: Self-pay | Admitting: Family Medicine

## 2014-12-22 MED ORDER — ATENOLOL 50 MG PO TABS: ORAL_TABLET | ORAL | Status: DC

## 2014-12-30 ENCOUNTER — Telehealth: Payer: Self-pay | Admitting: Family Medicine

## 2014-12-30 MED ORDER — DEXMETHYLPHENIDATE HCL 5 MG PO TABS: 5.0000 mg | ORAL_TABLET | Freq: Two times a day (BID) | ORAL | Status: DC

## 2014-12-30 NOTE — Telephone Encounter (Signed)
Patient brought in her prescription for Concerta 54 mg because she tried to pick this up at the pharmacy and it was quite expensive. She brought it back so that we can write a new prescription for generic Focalin. New prescription was printed. Patient can pick up at her convenience.

## 2014-12-30 NOTE — Telephone Encounter (Addendum)
Left a message and advised to pick up prescription.

## 2015-01-05 ENCOUNTER — Telehealth: Payer: Self-pay | Admitting: Family Medicine

## 2015-01-05 NOTE — Telephone Encounter (Signed)
Received fax for prior authorization on Dexmethylphenidate sent through cover my meds waiting on authorization. - CF

## 2015-01-07 ENCOUNTER — Ambulatory Visit (INDEPENDENT_AMBULATORY_CARE_PROVIDER_SITE_OTHER): Payer: 59

## 2015-01-07 ENCOUNTER — Ambulatory Visit (INDEPENDENT_AMBULATORY_CARE_PROVIDER_SITE_OTHER): Payer: 59 | Admitting: Physician Assistant

## 2015-01-07 ENCOUNTER — Encounter: Payer: Self-pay | Admitting: Physician Assistant

## 2015-01-07 VITALS — BP 136/79 | HR 93 | Ht 64.0 in | Wt 216.0 lb

## 2015-01-07 DIAGNOSIS — R1032 Left lower quadrant pain: Secondary | ICD-10-CM

## 2015-01-07 DIAGNOSIS — M5136 Other intervertebral disc degeneration, lumbar region: Secondary | ICD-10-CM

## 2015-01-07 DIAGNOSIS — Z8742 Personal history of other diseases of the female genital tract: Secondary | ICD-10-CM

## 2015-01-07 DIAGNOSIS — D259 Leiomyoma of uterus, unspecified: Secondary | ICD-10-CM

## 2015-01-07 DIAGNOSIS — N8320 Unspecified ovarian cysts: Secondary | ICD-10-CM

## 2015-01-07 DIAGNOSIS — N3001 Acute cystitis with hematuria: Secondary | ICD-10-CM

## 2015-01-07 DIAGNOSIS — Z8543 Personal history of malignant neoplasm of ovary: Secondary | ICD-10-CM

## 2015-01-07 LAB — POCT URINALYSIS DIPSTICK
Bilirubin, UA: NEGATIVE
Glucose, UA: NEGATIVE
Ketones, UA: NEGATIVE
Leukocytes, UA: NEGATIVE
Nitrite, UA: NEGATIVE
PROTEIN UA: NEGATIVE
RBC UA: NEGATIVE
SPEC GRAV UA: 1.02
UROBILINOGEN UA: 0.2
pH, UA: 6

## 2015-01-07 MED ORDER — MELOXICAM 15 MG PO TABS: 15.0000 mg | ORAL_TABLET | Freq: Every day | ORAL | Status: DC

## 2015-01-07 MED ORDER — TRAMADOL HCL 50 MG PO TABS: 50.0000 mg | ORAL_TABLET | Freq: Three times a day (TID) | ORAL | Status: DC | PRN

## 2015-01-07 NOTE — Progress Notes (Signed)
   Subjective:    Patient ID: Yolanda Mosley, female    DOB: 1980/06/07, 34 y.o.   MRN: [id number]  HPI Patient presents to clinic with a one week history of dysuria. She was seen in the ED on 9/1, dx with UTI, and treated with rocephin and keflex. UA confirmed presence of blood, leukocytes, and nitrates in the urine. She did not complete keflex treatment, and discontinued keflex on Saturday due to side effects nausea and diarrhea. Symptoms temporarily resolved with abx treatment, but returned Wednesday night. She reports left pelvic pain with an initial severity of 2, which has increased to a 6 today. She describes pain as sharp in quality. Pelvic pain is accompanied by bilateral back tenderness, with greater pain severity on the left. Pain is persistent throughout the day, positional, and aggravated by sex. There are no palliative factors. Patient PMH is significant for ovarian cancer, which was treated in 2005. She currently has a left ovarian cyst. She states cyst pain is typically transient, and that her current pain is different in quality.    Review of Systems  H/A- neg Abdominal pain- neg N/V/D - neg SOB- neg Cough- neg     Objective:   Physical Exam  Constitutional: She is oriented to person, place, and time. She appears well-developed and well-nourished.  Cardiovascular: Normal rate, regular rhythm and normal heart sounds.   Pulmonary/Chest: Effort normal and breath sounds normal.  No CVA tenderness.   Abdominal: Soft. Bowel sounds are normal.  Pain to palpation over Left lower abdomen.  No guarding or rebound.   Musculoskeletal:  Pain to palpation over lumbar spine and over SI joints bilaterally.  Negative straight leg test.  Strength 5/5 bilateral lower extremity.  ROM at waist normal.   Neurological: She is alert and oriented to person, place, and time.  Psychiatric: She has a normal mood and affect. Her behavior is normal.   Cardiac: S1, S2 auscultated. No M/R/G Pulmonary:  CTAB GI: NT/ND in RUQ, LUQ, and RLQ. LLQ tender to palpation, particularly over left pelvis. + BS  GU:  Negative for CVA tenderness   Urine Dipstick negative for blood, leukocytes, and nitrites.       Assessment & Plan:  Possible UTI - Patient did not complete abx treatment and may have recurrent infection. Send urine for culture. Will treat if culture is positive for bacterial infection.  Left lower abdominal pain- Possible ovarian cyst which has caused problems in the past.  Will order U/S today since pain is increasing. Tramadol given for acute pain. Follow up with GYN for further follow up and treatment.   Bilateral low back pain- evaluated in 07/2014. PT helped a lot. Start back exercises. Mobic given to take as needed. Can repeat PT if needed. Xray showed DDD Lumbar spine.   -

## 2015-01-09 LAB — URINE CULTURE: Colony Count: 30000

## 2015-01-10 ENCOUNTER — Other Ambulatory Visit: Payer: Self-pay | Admitting: Family Medicine

## 2015-01-10 MED ORDER — FEXOFENADINE HCL 180 MG PO TABS: 180.0000 mg | ORAL_TABLET | Freq: Every day | ORAL | Status: DC

## 2015-01-11 ENCOUNTER — Telehealth: Payer: Self-pay | Admitting: *Deleted

## 2015-01-11 DIAGNOSIS — Z13220 Encounter for screening for lipoid disorders: Secondary | ICD-10-CM

## 2015-01-11 DIAGNOSIS — I10 Essential (primary) hypertension: Secondary | ICD-10-CM

## 2015-01-11 DIAGNOSIS — Z8543 Personal history of malignant neoplasm of ovary: Secondary | ICD-10-CM

## 2015-01-11 NOTE — Telephone Encounter (Signed)
Fasting labs ordered

## 2015-01-13 LAB — COMPLETE METABOLIC PANEL WITH GFR
ALBUMIN: 3.6 g/dL (ref 3.6–5.1)
ALT: 16 U/L (ref 6–29)
AST: 17 U/L (ref 10–30)
Alkaline Phosphatase: 102 U/L (ref 33–115)
BUN: 14 mg/dL (ref 7–25)
CO2: 29 mmol/L (ref 20–31)
Calcium: 9.2 mg/dL (ref 8.6–10.2)
Chloride: 102 mmol/L (ref 98–110)
Creat: 0.6 mg/dL (ref 0.50–1.10)
GFR, Est African American: >89 mL/min (ref >=60)
GLUCOSE: 93 mg/dL (ref 65–99)
POTASSIUM: 4.3 mmol/L (ref 3.5–5.3)
SODIUM: 142 mmol/L (ref 135–146)
TOTAL PROTEIN: 6.1 g/dL (ref 6.1–8.1)
Total Bilirubin: 0.4 mg/dL (ref 0.2–1.2)

## 2015-01-13 LAB — LIPID PANEL
CHOL/HDL RATIO: 2.7 ratio (ref <=5.0)
Cholesterol: 141 mg/dL (ref 125–200)
HDL: 53 mg/dL (ref >=46)
LDL CALC: 77 mg/dL (ref <130)
Triglycerides: 56 mg/dL (ref <150)
VLDL: 11 mg/dL (ref <30)

## 2015-01-13 NOTE — Telephone Encounter (Signed)
I received a fax from [facility] stating they denied the medication due to not getting the clinical information they needed but we could resubmit the request with the clinical information. I resent it through cover my meds and now waiting on authorization. - CF

## 2015-01-14 LAB — CBC WITH DIFFERENTIAL/PLATELET
BASOS PCT: 1 % (ref 0–1)
Basophils Absolute: 0.1 10*3/uL (ref 0.0–0.1)
EOS ABS: 0.2 10*3/uL (ref 0.0–0.7)
Eosinophils Relative: 3 % (ref 0–5)
HCT: 36.2 % (ref 36.0–46.0)
HEMOGLOBIN: 12.1 g/dL (ref 12.0–15.0)
Lymphocytes Relative: 32 % (ref 12–46)
Lymphs Abs: 2 10*3/uL (ref 0.7–4.0)
MCH: 27 pg (ref 26.0–34.0)
MCHC: 33.4 g/dL (ref 30.0–36.0)
MCV: 80.8 fL (ref 78.0–100.0)
MONOS PCT: 4 % (ref 3–12)
MPV: 10.7 fL (ref 8.6–12.4)
Monocytes Absolute: 0.3 10*3/uL (ref 0.1–1.0)
NEUTROS ABS: 3.8 10*3/uL (ref 1.7–7.7)
NEUTROS PCT: 60 % (ref 43–77)
PLATELETS: 238 10*3/uL (ref 150–400)
RBC: 4.48 MIL/uL (ref 3.87–5.11)
RDW: 14.4 % (ref 11.5–15.5)
WBC: 6.4 10*3/uL (ref 4.0–10.5)

## 2015-01-14 LAB — CA 125: CA 125: 12 U/mL (ref <35)

## 2015-01-17 ENCOUNTER — Ambulatory Visit (INDEPENDENT_AMBULATORY_CARE_PROVIDER_SITE_OTHER): Payer: 59

## 2015-01-17 DIAGNOSIS — N8320 Unspecified ovarian cysts: Secondary | ICD-10-CM | POA: Diagnosis not present

## 2015-01-17 MED ORDER — GADOBENATE DIMEGLUMINE 529 MG/ML IV SOLN
20.0000 mL | Freq: Once | INTRAVENOUS | Status: AC | PRN
  Administered 2015-01-17: 20 mL via INTRAVENOUS

## 2015-01-17 NOTE — Telephone Encounter (Signed)
Received fax from [place] and medication is approved from 01/15/2015 - 01/14/2018 as long as she remains on the plan. PA# [id number]. - CF

## 2015-02-14 ENCOUNTER — Telehealth: Payer: Self-pay

## 2015-02-14 MED ORDER — DEXMETHYLPHENIDATE HCL 10 MG PO TABS: 10.0000 mg | ORAL_TABLET | Freq: Two times a day (BID) | ORAL | Status: DC

## 2015-02-14 NOTE — Telephone Encounter (Signed)
Pt called in regards to her Focalin Rx. States she feels the medication is working but would like to see if she can increase to the next dose. Please advice.

## 2015-02-14 NOTE — Telephone Encounter (Signed)
Notified of results

## 2015-02-14 NOTE — Telephone Encounter (Signed)
New Rx printed. Patient can pick up.

## 2015-03-17 ENCOUNTER — Ambulatory Visit: Payer: 59 | Admitting: Obstetrics & Gynecology

## 2015-04-04 DIAGNOSIS — C561 Malignant neoplasm of right ovary: Secondary | ICD-10-CM

## 2015-04-04 DIAGNOSIS — Z09 Encounter for follow-up examination after completed treatment for conditions other than malignant neoplasm: Secondary | ICD-10-CM | POA: Insufficient documentation

## 2015-04-04 HISTORY — DX: Malignant neoplasm of right ovary: C56.1

## 2015-04-04 HISTORY — DX: Encounter for follow-up examination after completed treatment for conditions other than malignant neoplasm: Z09

## 2015-04-19 ENCOUNTER — Encounter: Payer: Self-pay | Admitting: Family Medicine

## 2015-04-19 ENCOUNTER — Ambulatory Visit (INDEPENDENT_AMBULATORY_CARE_PROVIDER_SITE_OTHER): Payer: 59 | Admitting: Family Medicine

## 2015-04-19 VITALS — BP 125/85 | HR 91 | Wt 233.0 lb

## 2015-04-19 DIAGNOSIS — F909 Attention-deficit hyperactivity disorder, unspecified type: Secondary | ICD-10-CM | POA: Diagnosis not present

## 2015-04-19 DIAGNOSIS — F988 Other specified behavioral and emotional disorders with onset usually occurring in childhood and adolescence: Secondary | ICD-10-CM

## 2015-04-19 DIAGNOSIS — I10 Essential (primary) hypertension: Secondary | ICD-10-CM

## 2015-04-19 MED ORDER — DEXMETHYLPHENIDATE HCL 10 MG PO TABS: 10.0000 mg | ORAL_TABLET | Freq: Two times a day (BID) | ORAL | Status: DC

## 2015-04-19 NOTE — Progress Notes (Signed)
   Subjective:    Patient ID: Yolanda Mosley, female    DOB: 1980-07-09, 34 y.o.   MRN: [id number]  HPI here for ADD follow-up-she's doing well on her current regimen. No chest pain short of breath or palpitations. No insomnia. She feels like she likes the Vyvanse a little better but it was very costly and this is much more affordable. She wants to know if it comes in a higher strength. She feels like it could be adjusted upward some.  Hypertension- Pt denies chest pain, SOB, dizziness, or heart palpitations.  Taking meds as directed w/o problems.  Denies medication side effects.      Review of Systems     Objective:   Physical Exam  Constitutional: She is oriented to person, place, and time. She appears well-developed and well-nourished.  HENT:  Head: Normocephalic and atraumatic.  Cardiovascular: Normal rate, regular rhythm and normal heart sounds.   Pulmonary/Chest: Effort normal and breath sounds normal.  Neurological: She is alert and oriented to person, place, and time.  Skin: Skin is warm and dry.  Psychiatric: She has a normal mood and affect. Her behavior is normal.          Assessment & Plan:  ADD-doing very well on Focalin 10 mg twice a day. She is Re: On the maximum dose. Will refill medications for 3 months. Follow-up in 3 months. Call if any problems or side affects or concerns.  Hypertension-well-controlled. Continue current regimen.

## 2015-05-04 ENCOUNTER — Other Ambulatory Visit: Payer: Self-pay | Admitting: Family Medicine

## 2015-05-30 ENCOUNTER — Telehealth: Payer: Self-pay | Admitting: *Deleted

## 2015-05-30 NOTE — Telephone Encounter (Signed)
Called pt and she stated that she was seen at [facility]. She was told that she was having a panic attack. appt made for 2/6 @ 330. Pt advised that if she should begin having these sxs again to go directly to the ED. She voiced understanding and agreed.Yolanda Mosley

## 2015-05-31 ENCOUNTER — Encounter: Payer: Self-pay | Admitting: Family Medicine

## 2015-05-31 ENCOUNTER — Ambulatory Visit (INDEPENDENT_AMBULATORY_CARE_PROVIDER_SITE_OTHER): Payer: 59 | Admitting: Family Medicine

## 2015-05-31 VITALS — BP 124/72 | HR 76 | Wt 236.0 lb

## 2015-05-31 DIAGNOSIS — R0789 Other chest pain: Secondary | ICD-10-CM | POA: Diagnosis not present

## 2015-05-31 DIAGNOSIS — I10 Essential (primary) hypertension: Secondary | ICD-10-CM | POA: Diagnosis not present

## 2015-05-31 DIAGNOSIS — F411 Generalized anxiety disorder: Secondary | ICD-10-CM | POA: Diagnosis not present

## 2015-05-31 DIAGNOSIS — F902 Attention-deficit hyperactivity disorder, combined type: Secondary | ICD-10-CM | POA: Diagnosis not present

## 2015-05-31 NOTE — Progress Notes (Signed)
   Subjective:    Patient ID: Yolanda Mosley, female    DOB: 04-Sep-1980, 35 y.o.   MRN: [id number]  HPI She is here today to follow-up for emergency department visit. She was seen approximately 3 days ago on January 28 for chest pain and tachycardia. She got a burning feeling in her chest and throat which started at work after her boss "stressed her out ". She did not have any shortness of breath with the episode. They did do further workup with EKG, chest x-ray and d-dimer, and cardiac enzymes. The d-dimer was mildly elevated so they did a CT to rule out pulmonary embolism. It was negative. They decided they felt like her pain may be related to a possible panic attack. She has been very stressed at work. She likes her job is having some difficulty with her boss. That she just found out that her boss is actually going to be transferring to a different location.  She only takes 1/2 the atenolol.    Says she saw cardiology about 10 years ago at [facility] in [place].     Hypertension- Pt denies chest pain, SOB, dizziness, or heart palpitations.  Taking meds as directed w/o problems.  Denies medication side effects.  The home blood pressures have been mildly elevated. She only takes a half a tablet the atenolol because when she takes a whole tab she feels like her pulse drops too low.  ADHD-doing well on her Focalin. She did have a chest pain episode as above but that's the first time this is ever happen. No palpitations. No insomnia.  Review of Systems     Objective:   Physical Exam  Constitutional: She is oriented to person, place, and time. She appears well-developed and well-nourished.  HENT:  Head: Normocephalic and atraumatic.  Cardiovascular: Normal rate, regular rhythm and normal heart sounds.   Pulmonary/Chest: Effort normal and breath sounds normal.  Neurological: She is alert and oriented to person, place, and time.  Skin: Skin is warm and dry.  Psychiatric: She has a  normal mood and affect. Her behavior is normal.          Assessment & Plan:  Atypical CP - resolved.  She has felt more tired.  Will monitor for any further symptoms. She does report a possible abnormality noted on her heart. She thinks it was the aorta or aortic valve 10 years ago when she was being followed at [facility]. We'll try to call and get records.  GAD - dad 7 score of 11 today though she reports that she feels like some of the symptoms are more related to her ADHD. She rates her symptoms is somewhat difficult. She really feels like currently things are situational and should improve over the next couple of weeks.  ADHD - doing well on her Focalin. Will continue to monitor. If she continues to have chest pain that we may need to discontinue the stimulant.   HTN - at goal but just barely and she's been getting some higher blood pressures at home. Discussed trying to increase the atenolol to half a tab twice a day. She can call in a couple weeks and let me know if she feels like it's lowering her pulse to much.  Will call cardiology at [facility] for records.

## 2015-06-06 ENCOUNTER — Inpatient Hospital Stay: Payer: 59 | Admitting: Family Medicine

## 2015-06-09 ENCOUNTER — Other Ambulatory Visit: Payer: Self-pay | Admitting: Family Medicine

## 2015-06-22 ENCOUNTER — Ambulatory Visit (INDEPENDENT_AMBULATORY_CARE_PROVIDER_SITE_OTHER): Payer: 59 | Admitting: Physician Assistant

## 2015-06-22 ENCOUNTER — Telehealth: Payer: Self-pay | Admitting: Physician Assistant

## 2015-06-22 ENCOUNTER — Encounter: Payer: Self-pay | Admitting: Physician Assistant

## 2015-06-22 VITALS — BP 126/76 | HR 84 | Temp 98.0°F | Ht 64.0 in

## 2015-06-22 DIAGNOSIS — H65191 Other acute nonsuppurative otitis media, right ear: Secondary | ICD-10-CM

## 2015-06-22 DIAGNOSIS — K257 Chronic gastric ulcer without hemorrhage or perforation: Secondary | ICD-10-CM

## 2015-06-22 DIAGNOSIS — J069 Acute upper respiratory infection, unspecified: Secondary | ICD-10-CM | POA: Diagnosis not present

## 2015-06-22 MED ORDER — OMEPRAZOLE 40 MG PO CPDR
40.0000 mg | DELAYED_RELEASE_CAPSULE | Freq: Every day | ORAL | Status: DC
Start: 2015-06-22 — End: 2015-06-22

## 2015-06-22 MED ORDER — AMOXICILLIN-POT CLAVULANATE 875-125 MG PO TABS: 1.0000 | ORAL_TABLET | Freq: Two times a day (BID) | ORAL | Status: DC

## 2015-06-22 MED ORDER — OMEPRAZOLE 40 MG PO CPDR: 40.0000 mg | DELAYED_RELEASE_CAPSULE | Freq: Every day | ORAL | Status: DC

## 2015-06-22 NOTE — Telephone Encounter (Signed)
Please call [facility]:  "She thinks it was the aorta or aortic valve 10 years ago when she was being followed at [facility]. We'll try to call and get records."

## 2015-06-22 NOTE — Telephone Encounter (Signed)
Called and lvm asking that records be sent.Yolanda Mosley

## 2015-06-22 NOTE — Progress Notes (Addendum)
   Subjective:    Patient ID: Yolanda Mosley, female    DOB: 1980-08-20, 35 y.o.   MRN: [id number]  HPI  Patient is a 35 year old female that presents with a sore right ear, sore throat, runny nose, headache, and non-productive cough that started this morning. Patient denies abdominal pain, nausea, vomiting, chest pain, shortness of breath, hematuria, dysuria, constipation and diarrhea. Patient denies sick contacts. Patient denies recent travel. Patient denies changes in energy, malaise and body aches. Patient has a past medical history relevant for recurrent otitis media and streptococcal pharyngitis. Patient has been afebrile. Additional concerns for this visit include obtaining a prescription of Omeprazole for gastric ulcer.   Review of Systems Please see HPI    Objective:   Physical Exam  Constitutional: She is oriented to person, place, and time. She appears well-developed and well-nourished.  HENT:  Head: Normocephalic and atraumatic.  Patient's external auditory canal is erythematous bilaterally with a positive light reflex and without visible pus. Patient appears to have fluid behind the left tympanic membrane.   Patient's posterior pharynx is erythematous with swollen tonsils without petechia. Patient has mild tonsillar exudate.   Eyes: Conjunctivae are normal. Pupils are equal, round, and reactive to light.  Neck: Normal range of motion.  Cardiovascular: Normal rate, regular rhythm, normal heart sounds and intact distal pulses.   No murmur heard. Pulmonary/Chest: Effort normal and breath sounds normal. No respiratory distress. She has no wheezes. She has no rales. She exhibits no tenderness.  Abdominal: Soft. Bowel sounds are normal. She exhibits no distension and no mass. There is no tenderness. There is no rebound and no guarding.  Musculoskeletal: Normal range of motion.  Neurological: She is alert and oriented to person, place, and time. She has normal reflexes.  Skin: Skin is  warm and dry.  Psychiatric: She has a normal mood and affect. Her behavior is normal. Judgment and thought content normal.      Assessment & Plan:   1. Otitis Media, right Patient has a history of recurrent otitis media. Patient's rapid in office strep test was negative. Patient was started on 875-125 mg of Amoxicillin BID for ten days. Follow up if not improving.   2. Chronic gastric ulcer Refilled omeprazole for 90 days with one refill.

## 2015-06-22 NOTE — Telephone Encounter (Signed)
Dr. Madilyn Fireman,   Pt comes in asking if we ever got her records. I see where you wanted to get records from [facility]. I did not see them in chart. FYI.

## 2015-07-15 ENCOUNTER — Ambulatory Visit: Payer: 59 | Admitting: Family Medicine

## 2015-07-27 ENCOUNTER — Encounter: Payer: Self-pay | Admitting: Osteopathic Medicine

## 2015-07-27 ENCOUNTER — Ambulatory Visit (INDEPENDENT_AMBULATORY_CARE_PROVIDER_SITE_OTHER): Payer: 59

## 2015-07-27 ENCOUNTER — Ambulatory Visit (INDEPENDENT_AMBULATORY_CARE_PROVIDER_SITE_OTHER): Payer: 59 | Admitting: Osteopathic Medicine

## 2015-07-27 VITALS — BP 133/85 | HR 97 | Ht 64.0 in | Wt 238.0 lb

## 2015-07-27 DIAGNOSIS — R058 Other specified cough: Secondary | ICD-10-CM

## 2015-07-27 DIAGNOSIS — R059 Cough, unspecified: Secondary | ICD-10-CM

## 2015-07-27 DIAGNOSIS — R05 Cough: Secondary | ICD-10-CM | POA: Diagnosis not present

## 2015-07-27 DIAGNOSIS — R0989 Other specified symptoms and signs involving the circulatory and respiratory systems: Secondary | ICD-10-CM | POA: Diagnosis not present

## 2015-07-27 MED ORDER — IPRATROPIUM BROMIDE 0.03 % NA SOLN: 2.0000 | Freq: Three times a day (TID) | NASAL | Status: DC

## 2015-07-27 MED ORDER — METHYLPREDNISOLONE 4 MG PO TBPK: ORAL_TABLET | ORAL | Status: DC

## 2015-07-27 MED ORDER — BENZONATATE 200 MG PO CAPS: 200.0000 mg | ORAL_CAPSULE | Freq: Two times a day (BID) | ORAL | Status: DC | PRN

## 2015-07-27 MED ORDER — FLUTICASONE PROPIONATE HFA 110 MCG/ACT IN AERO: 2.0000 | INHALATION_SPRAY | Freq: Two times a day (BID) | RESPIRATORY_TRACT | Status: DC

## 2015-07-27 NOTE — Progress Notes (Signed)
HPI: Yolanda Mosley is a 35 y.o. female who presents to [facility]  today for chief complaint of:  Chief Complaint  Patient presents with  . Cough   . Quality: severe cough, sinus congestoin . Assoc signs/symptoms: see ROS . Duration: 10 days . Modifying factors: has tried the following OTC/Rx medications: see below for ER Rx . Context:  06/22/15 treated for L OM with Amoxicillin bid x 10 days. In ER 07/18/15 - Prednisone,Tessalon, Albuterol, Phenergan syrup, seen again 07/20/15 (+) Flu-B -  Ibuprofen and Tylenol, Zyrtec, Phenergan syrup   Past medical, social and family history reviewed. Current medications and allergies reviewed.     Review of Systems: CONSTITUTIONAL: no fever/chills, low fever last week HEAD/EYES/EARS/NOSE/THROAT: yes headache, no vision change or hearing change, yes sore throat CARDIAC: No chest pain/pressure/palpitations RESPIRATORY: yes cough, no shortness of breath GASTROINTESTINAL: no nausea, no vomiting, no abdominal pain, no diarrhea MUSCULOSKELETAL: no myalgia/arthralgia   Exam:  BP 133/85 mmHg  Pulse 97  Ht 5\' 4"  (1.626 m)  Wt 238 lb (107.956 kg)  BMI 40.83 kg/m2 Constitutional: VSS, see above. General Appearance: alert, well-developed, well-nourished, NAD Eyes: Normal lids and conjunctive, non-icteric sclera,  Ears, Nose, Mouth, Throat: Normal external inspection ears/nares/mouth/lips/gums, normal TM, MMM;       posterior pharynx without erythema, without exudate Neck: No masses, trachea midline. normal lymph nodes Respiratory: Normal respiratory effort. No  wheeze/rhonchi/rales Cardiovascular: S1/S2 normal, no murmur/rub/gallop auscultated. RRR.   No results found for this or any previous visit (from the past 72 hour(s)). No results found.  CXR on personal review Cardiomediastinal silhouette/heart size: normal Obvious bony abnormality: none Infiltrate: none Mass or other opacity: none Atelectasis:  none Diaphragms: normal Lateral view: normal Images were reviewed with the patient. Pt counseled that radiologist will review the images as well, our office will call if the formal read reveals any significant findings other than what has been noted above.     ASSESSMENT/PLAN: Recent Flu-B (+). No PNA on CXR that I can see, no sinus tenderness, await radiology over-read, will treat as postviral cough, repeat steroids, add ICS, refill Tessalon, give Atrovent for rhinorrhea. Pt intolerance to Codeine, will hold off on codeine/Hycodan cough syrup, OTC meds advised as well as other home remedies for cough which ay be effective.   Post-viral cough syndrome - Plan: fluticasone (FLOVENT HFA) 110 MCG/ACT inhaler, methylPREDNISolone (MEDROL DOSEPAK) 4 MG TBPK tablet, ipratropium (ATROVENT) 0.03 % nasal spray, benzonatate (TESSALON) 200 MG capsule  Cough - Plan: DG Chest 2 View    Return if symptoms worsen or fail to improve - CALL IF SINUSES WORSE BY FRIDAY.

## 2015-07-27 NOTE — Patient Instructions (Signed)
DR. Redgie Grayer HOME CARE INSTRUCTIONS: VIRAL ILLNESSES - ACHES/PAINS, SORE THROAT, SINUSITIS, COUGH, GASTRITIS   FRIST, A FEW NOTES ON OVER-THE-COUNTER MEDICATIONS!   USE CAUTION - MANY OVER-THE-COUNTER MEDICATIONS COME IN COMBINATIONS OF MULTIPLE GENERIC MEDICINES. FOR INSTANCE, NYQUIL HAS TYLENOL + COUGH MEDICINE + AN ANTIHISTAMINE, SO BE CAREFUL YOU'RE NOT TAKING A COMBINATION MEDICINE WHICH CONTAINS MEDICATIONS YOU'RE ALSO TAKING SEPARATELY (LIKE NYQUIL SYRUP PLUS TYLENOL PILLS).   YOUR PHARMACIST CAN HELP YOU AVOID MEDICATION INTERACTIONS AND DUPLICATIONS - ASK FOR THEIR HELP IF YOU ARE CONFUSED OR UNSURE ABOUT WHAT TO PURCHASE OVER-THE-COUNTER!   REMEMBER - IF YOU'RE EVER CONCERNED ABOUT MEDICATION SIDE EFFECTS, OR IF YOU'RE EVER CONCERNED YOUR SYMPTOMS ARE GETTING WORSE DESPITE TREATMENT, PLEASE CALL THE DOCTOR'S OFFICE! IF AFTER-HOURS, YOU CAN BE SEEN IN URGENT CARE OR, FOR SEVERE ILLNESS, PLEASE GO TO THE EMERGENCY ROOM.   TREAT SINUS CONGESTION, RUNNY NOSE & POSTNASAL DRIP:  Treat to increase airflow through sinuses, decrease congestion pain and prevent bacterial growth!   Remember, only 0.5-2% of sinus infections are due to a bacteria, the rest are due to a virus (usually the common cold) which will not get better with antibiotics!  NASAL SPRAYS: generally safe and should not interact with other medicines. Can take any of these medications, either alone or together... FLONASE (FLUTICASONE) - 2 sprays in each nostril twice per day (also a great allergy medicine to use long-term!) AFRIN (OXYMETOLAZONE) - use sparingly because it will cause rebound congestion, NEVER USE IN KIDS  SALINE NASAL SPRAY- no limit, but avoid use after other nasal sprays or it can wash the medicine away PRESCRTIPTION ATROVENT - as directed on prescription bottle  DECONGESTANTS: Helps dry out runny nose and helps with sinus pain. May cause insomnia, or sometimes elevated heart rate. Can cause problems  if used often in people with high blood pressure. OK to use unless Dr A says otherwise. NEVER USE IN KIDS UNDER 79 YEARS OLD. Only use one of the following... SUDAFED (PSEUDOEPHEDINE) - 60 mg every 4 - 6 hours, also comes in 120 mg extended release every 12 hours, maximum 240 mg in 24 hours.  SUDAED PE (PHENYLEPHRINE) - 10 mg every 4 - 6 hours, maximum 60 mg per day  COMBINATIONS OF ABOVE ANTIHISTAMINES + DECONGESTANTS: these usually require you to show your ID at the pharmacy counter.  Only use one of the following... ZYRTEC-D (CETIRIZINE + PSEUDOEPHEDRINE) - 12 hour formulation as directed CLARITIN-D (LORATIDINE + PSEUDOEPHEDRINE) - 12 and 24 hour formulations as directed ALLEGRA-D (FEXOFENADINE + PSEUDOEPHEDRINE) - 12 and 24 hour formulations as directed  PRESCRIPTION TREATMENT FOR SINUS PROBLEMS: Can include nasal sprays, pills, or antibiotics in the case of true bacterial infection. Not everyone needs an antibiotic but there are other medicines which will help you feel better while your body fights the infection!    TREAT COUGH & SORE THROAT:  Remember, cough is the body's way of protecting your airways and lungs, it's a hard-wired reflex that is tough for medicines to treat!   Irritation to the airways will cause cough. This irritation is usually caused by upper airway problems like postnasal drip (treat as above) and viral sore throat, but in severe cases can be due to lower airway problems like bronchitis or pneumonia, which a doctor can usually hear on exam of your lungs or see on an X-ray.  Sore throat is almost always due to a virus, but occasionally caused by certain strains of Strep, which requires antibiotics.  Exercise and smoking may make cough worse - take it easy while you're sick, and QUIT SMOKING!   Cough due to viral infection can linger for 2-3 weeks or so. If you're coughing longer than you think you should, or if the cough is severe, please make an appointment in  the office - you may need a chest X-ray.  EXPECTORANT: Used to help clear airways, take these with PLENTY of water to help thin mucus secretions and make the mucus easier to cough up  Only use one of the following... ROBITUSSIN (DEXTROMETHORPHAN OR GUAIFENISEN depending on formulation)  MUCINEX (GUAIFENICEN) - usually longer acting  COUGH DROPS/LOZENGES: Whichever over-the-counter agent you prefer!  Here are some suggestions for ingredients to look for (can take both)... BENZOCAINE - numbing effect, also in [place] - cooling effect  HONEY: has gone head-to-head in several clinical trials with prescription cough medicines and found to be equally effective! Try 1 Teaspoon Honey + 2 Drops Lemon Juice, as much as you want to use. NONE FOR KIDS UNDER AGE 63  HERBAL TEA: There are certain ingredients which help "coat the throat" to relieve pain  such as ELM BARK, LICORICE ROOT, MARSHMALLOW ROOT  PRESCRIPTION TREATMENT FOR COUGH: Reserved for severe cases. Can include pills, syrups or inhalers.    TREAT ACHES & PAINS, FEVER:  Illness causes aches, pains and fever as your body increases its natural inflammation response to help fight the infection.   Rest, good hydration and nutrition, and taking anti-inflammatory medications will help.   Remember: a true fever is a temperature 100.4 or higher. If you have a fever that is 105.0 or higher, that is a dangerous level and needs medical attention in the office or in the ER!  Can take both of these together if it's ok with your doctor... IBUPROFEN - 400-800 mg every 6 - 8 hours. Ibuprofen and similar medications can cause problems for people with heart disease or history of stomach ulcers, check with Dr A first if you're concerned. Lower doses are usually safe and effective.  TYLENOL (ACETAMINOPHEN) - [phone] mg every 6 hours. It won't cause problems with heart or stomach.     TREAT GASTROINTESTINAL  SYMPTOMS:  Hydrate, hydrate, hydrate! Try drinking Gatorade/Powerade, broth/soup. Avoid milk and juice, these can make diarrhea worse. You can drink water, of course, but if you are also having vomiting and loose stool you are losing electrolytes which water alone can't replace.  IMMODIUM (LOPERAMIDE) - as directed on the bottle to help with loose stool PRESCRIPTION ZOFRAN (ONDANSETRON) OR PHENERGAN (PROMETHAZINE) - as directed to help nausea and vomiting. Try taking these before you eat if you are having trouble keeping food down.     REMEMBER - THE MOST IMPORTANT THINGS YOU CAN DO TO AVOID CATCHING OR SPREADING ILLNESS INCLUDE:   COVER YOUR COUGH WITH YOUR ARM, NOT WITH YOUR HANDS!   DISINFECT COMMONLY USED SURFACES (SUCH AS TELEPHONES & DOORKNOBS) WHEN YOU OR SOMEONE CLOSE TO YOU IS FEELING SICK!   BE SURE VACCINES ARE UP TO DATE - GET A FLU SHOT EVERY YEAR! THE FLU CAN BE DEADLY FOR BABIES, ELDERLY FOLKS, AND PEOPLE WITH WEAK IMMUNE SYSTEMS - YOU SHOULD BE VACCINATED TO HELP PREVENT YOURSELF FROM GETTING SICK, BUT ALSO TO PREVENT SOMEONE ELSE FROM GETTING AN INFECTION WHICH MAY HOSPITALIZE OR KILL THEM.  GOOD NUTRITION AND HEALTHY LIFESTYLE WILL HELP YOUR IMMUNE SYSTEM YEAR-ROUND! THERE IS NO MAGIC SUPPLEMENT!  AND ABOVE ALL - Yolanda Mosley  HANDS OFTEN!

## 2015-07-29 ENCOUNTER — Ambulatory Visit (INDEPENDENT_AMBULATORY_CARE_PROVIDER_SITE_OTHER): Payer: 59 | Admitting: Physician Assistant

## 2015-07-29 ENCOUNTER — Other Ambulatory Visit: Payer: Self-pay | Admitting: Physician Assistant

## 2015-07-29 ENCOUNTER — Encounter: Payer: Self-pay | Admitting: Physician Assistant

## 2015-07-29 VITALS — BP 104/68 | HR 78 | Temp 97.5°F | Wt 237.0 lb

## 2015-07-29 DIAGNOSIS — R05 Cough: Secondary | ICD-10-CM | POA: Diagnosis not present

## 2015-07-29 DIAGNOSIS — R059 Cough, unspecified: Secondary | ICD-10-CM

## 2015-07-29 MED ORDER — DEXMETHYLPHENIDATE HCL 10 MG PO TABS: 10.0000 mg | ORAL_TABLET | Freq: Two times a day (BID) | ORAL | Status: DC

## 2015-07-29 MED ORDER — AZITHROMYCIN 250 MG PO TABS: ORAL_TABLET | ORAL | Status: DC

## 2015-07-29 MED ORDER — AMBULATORY NON FORMULARY MEDICATION: Status: DC

## 2015-07-29 NOTE — Progress Notes (Signed)
   Subjective:    Patient ID: Yolanda Mosley, female    DOB: 1980/06/01, 35 y.o.   MRN: [id number]  HPI Pt is a 35 yo female who presents to the clinic with a cough that persist. She has been coughing now for 2 weeks. She is coughing so hard at times she vomits. She works at a pharmacy. She has been told her cough sounds like whooping cough. She was seen by Dr. [name] 2 days ago and given prednisone, flovent, atrovent with no improvement. She does not know how to use flovent she feels like not getting into lungs.  Denies any fever or chills or body aches. She does feel SOB and like her lungs are tight. Little to no production.    Review of Systems    see HPI> Objective:   Physical Exam  Constitutional: She appears well-developed and well-nourished.  HENT:  Head: Normocephalic and atraumatic.  Right Ear: External ear normal.  Left Ear: External ear normal.  TM"s clear.  Oropharynx erythematous.  Nasal turbinates red and swollen  Eyes:  Injected bilateral eyes with watery discharge.   Neck: Normal range of motion. Neck supple.  Cardiovascular: Normal rate, regular rhythm and normal heart sounds.   Pulmonary/Chest: Breath sounds normal.  With any deep breath cough alerted does sound dry and strong. Do not necessarily hear the whooping sound. No wheezing.  Lymphadenopathy:    She has no cervical adenopathy.  Skin:  Flushed cheeks.   Psychiatric: She has a normal mood and affect. Her behavior is normal.          Assessment & Plan:  Cough- pertussis culture sent. Continue prednisone taper. Start Zpak. Continue flovent and atrovent. Wrote for spacer. Discussed cough suppressant. Follow up as needed.

## 2015-08-01 ENCOUNTER — Other Ambulatory Visit: Payer: Self-pay | Admitting: *Deleted

## 2015-08-01 ENCOUNTER — Other Ambulatory Visit: Payer: Self-pay | Admitting: Family Medicine

## 2015-08-01 MED ORDER — PREDNISONE 50 MG PO TABS: 50.0000 mg | ORAL_TABLET | Freq: Every day | ORAL | Status: DC

## 2015-08-12 LAB — BORDETELLA PERTUSSIS PCR
B parapertussis, DNA: NOT DETECTED
B pertussis, DNA: NOT DETECTED

## 2015-08-12 LAB — CULTURE, BORDETELLA PERTUSSIS

## 2015-08-24 ENCOUNTER — Other Ambulatory Visit: Payer: Self-pay | Admitting: Family Medicine

## 2015-09-12 ENCOUNTER — Other Ambulatory Visit: Payer: Self-pay | Admitting: Family Medicine

## 2015-09-16 ENCOUNTER — Other Ambulatory Visit: Payer: Self-pay | Admitting: Family Medicine

## 2015-09-20 ENCOUNTER — Ambulatory Visit: Payer: 59 | Admitting: Family Medicine

## 2015-10-16 ENCOUNTER — Other Ambulatory Visit: Payer: Self-pay | Admitting: Osteopathic Medicine

## 2015-10-21 ENCOUNTER — Other Ambulatory Visit: Payer: Self-pay | Admitting: Family Medicine

## 2015-10-26 ENCOUNTER — Other Ambulatory Visit: Payer: Self-pay | Admitting: Family Medicine

## 2015-11-04 ENCOUNTER — Encounter: Payer: Self-pay | Admitting: Family Medicine

## 2015-11-04 ENCOUNTER — Ambulatory Visit (INDEPENDENT_AMBULATORY_CARE_PROVIDER_SITE_OTHER): Payer: 59 | Admitting: Family Medicine

## 2015-11-04 VITALS — BP 118/71 | HR 75 | Wt 240.0 lb

## 2015-11-04 DIAGNOSIS — R61 Generalized hyperhidrosis: Secondary | ICD-10-CM

## 2015-11-04 DIAGNOSIS — I10 Essential (primary) hypertension: Secondary | ICD-10-CM | POA: Diagnosis not present

## 2015-11-04 DIAGNOSIS — L74519 Primary focal hyperhidrosis, unspecified: Secondary | ICD-10-CM

## 2015-11-04 DIAGNOSIS — Z114 Encounter for screening for human immunodeficiency virus [HIV]: Secondary | ICD-10-CM

## 2015-11-04 DIAGNOSIS — B353 Tinea pedis: Secondary | ICD-10-CM | POA: Diagnosis not present

## 2015-11-04 HISTORY — DX: Generalized hyperhidrosis: R61

## 2015-11-04 MED ORDER — ALUMINUM CHLORIDE 20 % EX SOLN: Freq: Every day | CUTANEOUS | Status: DC

## 2015-11-04 MED ORDER — ATENOLOL 50 MG PO TABS: 50.0000 mg | ORAL_TABLET | Freq: Every day | ORAL | Status: DC

## 2015-11-04 MED ORDER — TERBINAFINE HCL 250 MG PO TABS: 250.0000 mg | ORAL_TABLET | Freq: Every day | ORAL | Status: DC

## 2015-11-04 MED ORDER — BENAZEPRIL-HYDROCHLOROTHIAZIDE 10-12.5 MG PO TABS: 1.0000 | ORAL_TABLET | Freq: Every day | ORAL | Status: DC

## 2015-11-04 NOTE — Patient Instructions (Signed)
Tea tree oil - rub on her feet and inbetween the toes twice a day.

## 2015-11-04 NOTE — Progress Notes (Signed)
Subjective:    CC: Possible athletes foot.   HPI:  Rash on feet - x 2wks tried lotrimin didn't work. She says particularly her right foot. 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 She says the Lotrimin did help some just didn't completely cleared up.  She also complains of excess sweating on her underarms, palms of her hands and feet. She says in this way as long as she can possibly remember. Her dad and her cousin have the same issues. She says her cousin is actually getting some type of treatment and she would like to discuss options.  Hypertension- Pt denies chest pain, SOB, dizziness, or heart palpitations.  Taking meds as directed w/o problems.  Denies medication side effects.  She doesn't refills on her medication as well.   Past medical history, Surgical history, Family history not pertinant except as noted below, Social history, Allergies, and medications have been entered into the medical record, reviewed, and corrections made.   Review of Systems: No fevers, chills, night sweats, weight loss, chest pain, or shortness of breath.   Objective:    General: Well Developed, well nourished, and in no acute distress.  Neuro: Alert and oriented x3, extra-ocular muscles intact, sensation grossly intact.  HEENT: Normocephalic, atraumatic  Skin: Warm, no rashes.He does have excess sweating on her palms bottoms of her feet and axilla bilaterally.  Cardiac: Regular rate and rhythm, no murmurs rubs or gallops, no lower extremity edema.  Respiratory: Clear to auscultation bilaterally. Not using accessory muscles, speaking in full sentences. Ext: Right foot has a lot of dry scale in skin on the top of all of the toes with some cracking over the great toe at the end of the joint. In between the toes she does have some wet macerated skin.   Impression and Recommendations:   HTN - Hypertension- Well controlled. Continue current regimen. Follow up in 6 months.   Tinea pedis.  KOH performed as well.  She also has a lot of dry skin on the top of the toes even though her feet are very sweaty so there certainly could be a component of eczematous changes well. Next  Hyperhidrosis-discussed treatment options. For now we'll start with Drysol and eventually could consider Botox injections. If skin becomes irritated then decrease frequency of use. Marland Kitchen

## 2015-11-04 NOTE — Addendum Note (Signed)
Addended by: Teddy Spike on: 11/04/2015 10:36 AM   Modules accepted: Orders

## 2015-11-05 LAB — COMPLETE METABOLIC PANEL WITH GFR
ALBUMIN: 4 g/dL (ref 3.6–5.1)
ALT: 22 U/L (ref 6–29)
AST: 20 U/L (ref 10–30)
Alkaline Phosphatase: 109 U/L (ref 33–115)
BILIRUBIN TOTAL: 0.4 mg/dL (ref 0.2–1.2)
BUN: 21 mg/dL (ref 7–25)
CO2: 25 mmol/L (ref 20–31)
CREATININE: 0.7 mg/dL (ref 0.50–1.10)
Calcium: 9.1 mg/dL (ref 8.6–10.2)
Chloride: 101 mmol/L (ref 98–110)
GFR, Est Non African American: >89 mL/min (ref >=60)
Glucose, Bld: 89 mg/dL (ref 65–99)
Potassium: 3.8 mmol/L (ref 3.5–5.3)
Sodium: 136 mmol/L (ref 135–146)
TOTAL PROTEIN: 6.5 g/dL (ref 6.1–8.1)

## 2015-11-05 LAB — LIPID PANEL
Cholesterol: 156 mg/dL (ref 125–200)
HDL: 54 mg/dL (ref >=46)
LDL CALC: 83 mg/dL (ref <130)
TRIGLYCERIDES: 95 mg/dL (ref <150)
Total CHOL/HDL Ratio: 2.9 Ratio (ref <=5.0)
VLDL: 19 mg/dL (ref <30)

## 2015-11-05 LAB — HIV ANTIBODY (ROUTINE TESTING W REFLEX): HIV: NONREACTIVE

## 2015-11-07 LAB — FUNGAL STAIN

## 2016-01-04 ENCOUNTER — Other Ambulatory Visit: Payer: Self-pay | Admitting: *Deleted

## 2016-01-04 NOTE — Telephone Encounter (Signed)
Pt lvm requesting refill for focalin. Called pt back and informed her she will need an appt. She was transferred to scheduling.Yolanda Mosley, [name]

## 2016-01-05 ENCOUNTER — Encounter: Payer: Self-pay | Admitting: Family Medicine

## 2016-01-05 ENCOUNTER — Ambulatory Visit (INDEPENDENT_AMBULATORY_CARE_PROVIDER_SITE_OTHER): Payer: 59 | Admitting: Family Medicine

## 2016-01-05 VITALS — BP 107/65 | HR 76 | Ht 64.0 in | Wt 246.0 lb

## 2016-01-05 DIAGNOSIS — H9201 Otalgia, right ear: Secondary | ICD-10-CM

## 2016-01-05 DIAGNOSIS — F909 Attention-deficit hyperactivity disorder, unspecified type: Secondary | ICD-10-CM | POA: Diagnosis not present

## 2016-01-05 DIAGNOSIS — F988 Other specified behavioral and emotional disorders with onset usually occurring in childhood and adolescence: Secondary | ICD-10-CM

## 2016-01-05 MED ORDER — DEXMETHYLPHENIDATE HCL 10 MG PO TABS: 10.0000 mg | ORAL_TABLET | Freq: Two times a day (BID) | ORAL | 0 refills | Status: DC

## 2016-01-05 NOTE — Progress Notes (Signed)
Subjective:    CC: ADD  HPI:  Follow-up ADD-she's doing very well on her Focalin. She says she really only takes it on the days that she works and sometimes will use it once a day instead of twice a day so typically lasts her a little bit longer. She denies any chest pain shortness of breath palpitations or insomnia on the medication. She denies any significantly suppressed appetite. Next  She's also had some right ear pain for about 2-3 days. No other cold symptoms such as sinus congestion or sore throat. She says the pain does radiate down towards her right jaw some. She has been taking her Allegra but says she did miss it for a couple of days and wonders if that could've triggered it. No fevers chills.  Past medical history, Surgical history, Family history not pertinant except as noted below, Social history, Allergies, and medications have been entered into the medical record, reviewed, and corrections made.   Review of Systems: No fevers, chills, night sweats, weight loss, chest pain, or shortness of breath.   Objective:    General: Well Developed, well nourished, and in no acute distress.  Neuro: Alert and oriented x3, extra-ocular muscles intact, sensation grossly intact.  HEENT: Normocephalic, atraumatic, TMs and canals are clear bilaterally. No significant cervical lymphadenopathy. Oropharynx is clear. Skin: Warm and dry, no rashes. Cardiac: Regular rate and rhythm, no murmurs rubs or gallops, no lower extremity edema.  Respiratory: Clear to auscultation bilaterally. Not using accessory muscles, speaking in full sentences.   Impression and Recommendations:   ADD-well controlled on current regimen. Refill prescription printed today for 90 day supply. Next  Right otalgia-likely related to allergic rhinitis. Continue with antihistamine and okay to take a decongestant as long as she's not taking it on the same day as her Focalin. If the ear pain is progressing or getting worse over  the weekend and please give [place] a call back.

## 2016-01-09 ENCOUNTER — Other Ambulatory Visit: Payer: Self-pay | Admitting: Family Medicine

## 2016-01-11 ENCOUNTER — Other Ambulatory Visit: Payer: Self-pay | Admitting: Physician Assistant

## 2016-01-24 ENCOUNTER — Ambulatory Visit (INDEPENDENT_AMBULATORY_CARE_PROVIDER_SITE_OTHER): Payer: 59 | Admitting: Physician Assistant

## 2016-01-24 ENCOUNTER — Encounter: Payer: Self-pay | Admitting: Physician Assistant

## 2016-01-24 ENCOUNTER — Telehealth: Payer: Self-pay | Admitting: Physician Assistant

## 2016-01-24 ENCOUNTER — Other Ambulatory Visit: Payer: Self-pay | Admitting: *Deleted

## 2016-01-24 VITALS — BP 111/73 | HR 68 | Ht 64.0 in | Wt 241.0 lb

## 2016-01-24 DIAGNOSIS — M7710 Lateral epicondylitis, unspecified elbow: Secondary | ICD-10-CM | POA: Insufficient documentation

## 2016-01-24 DIAGNOSIS — M7712 Lateral epicondylitis, left elbow: Secondary | ICD-10-CM | POA: Diagnosis not present

## 2016-01-24 MED ORDER — OLOPATADINE HCL 0.1 % OP SOLN: 1.0000 [drp] | Freq: Two times a day (BID) | OPHTHALMIC | 11 refills | Status: DC | PRN

## 2016-01-24 NOTE — Telephone Encounter (Signed)
Yolanda Mosley, ok to send eye drops with 11 refills.

## 2016-01-24 NOTE — Telephone Encounter (Signed)
Done

## 2016-01-24 NOTE — Telephone Encounter (Signed)
Patient was seen today and request to have Eye drops called into the [facility] on [facility] [address] [facility]. She said she discussed with the student and then forgot to mention again with [place]. Thanks

## 2016-01-24 NOTE — Progress Notes (Addendum)
Subjective:     Patient ID: Yolanda Mosley, female   DOB: [birth year], 35 y.o.   MRN: [id number]  HPI Patient is a 35 y.o. Caucasian female presenting today with complaints of left elbow pain. The patient reports that it is worse in the morning and after use. 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 The patient states that she has been hurting more with increased use and she has been applying ice and using a brace. The patient notes that the pain started about a month ago but she has had this type of pain before about a year ago. The patient notes that the pain is much worse with extension and ranges from a 2/10 to a 9/10 depending on the day. The patient denies recent injury, radiation, numbness/tingling, chest pain, or shortness of breath.   Review of Systems  Constitutional: Positive for activity change. Negative for appetite change, chills, diaphoresis, fatigue, fever and unexpected weight change.  HENT: Negative.  Negative for congestion, ear discharge, ear pain, nosebleeds, postnasal drip, rhinorrhea, sinus pressure and sneezing.   Eyes: Positive for redness and itching. Negative for photophobia, pain, discharge and visual disturbance.  Respiratory: Negative for cough, chest tightness and shortness of breath.   Cardiovascular: Negative for chest pain, palpitations and leg swelling.  Gastrointestinal: Negative.   Endocrine: Negative.   Genitourinary: Negative.   Musculoskeletal: Positive for arthralgias. Negative for back pain, gait problem, joint swelling, myalgias, neck pain and neck stiffness.  Skin: Negative.   Allergic/Immunologic: Positive for environmental allergies.  Neurological: Negative for dizziness, syncope, weakness, light-headedness and numbness.  Psychiatric/Behavioral: Negative.        Objective:   Physical Exam  Constitutional: She is oriented to person, place, and time. She appears well-developed and well-nourished.  No distress.  HENT:  Head: Normocephalic and atraumatic.  Right Ear: External ear normal.  Left Ear: External ear normal.  Nose: Nose normal.  Mouth/Throat: Oropharynx is clear and moist. No oropharyngeal exudate.  Eyes: Conjunctivae and EOM are normal. Pupils are equal, round, and reactive to light. Right eye exhibits no discharge. Left eye exhibits no discharge. No scleral icterus.  Neck: Normal range of motion. Neck supple. No JVD present. No tracheal deviation present. No thyromegaly present.  Cardiovascular: Normal rate, regular rhythm, normal heart sounds and intact distal pulses.  Exam reveals no gallop and no friction rub.   No murmur heard. Pulmonary/Chest: Effort normal and breath sounds normal. No stridor. No respiratory distress. She has no wheezes. She has no rales. She exhibits no tenderness.  Musculoskeletal: She exhibits tenderness. She exhibits no edema or deformity.  Patient lateral epicondyl tender to palpation and pain exacerbated with extension of the forearm.  Lymphadenopathy:    She has no cervical adenopathy.  Neurological: She is alert and oriented to person, place, and time. She displays normal reflexes. No cranial nerve deficit. She exhibits normal muscle tone. Coordination normal.  Skin: She is not diaphoretic.  Psychiatric: She has a normal mood and affect. Her behavior is normal. Judgment and thought content normal.      Assessment:     Yolanda Mosley was seen today for left elbow pain.  Diagnoses and all orders for this visit:  Epicondylitis, lateral (tennis elbow), left      Plan:     1.Lateral Epicondylitis -  Patient to rest, ice, and elevate her elbow when possible. Patient educated on therapeutic exercises and given a brace in-clinic today. Patient to take  NSAIDs 600mg  up to three times daily. Patient to return-to-clinic if symptoms persist or worsen.

## 2016-01-24 NOTE — Patient Instructions (Signed)
Lateral Epicondylitis With Rehab Lateral epicondylitis involves inflammation and pain around the outer portion of the elbow. The pain is caused by inflammation of the tendons in the forearm that bring back (extend) the wrist. Lateral epicondylitis is also called tennis elbow, because it is very common in tennis players. However, it may occur in any individual who extends the wrist repetitively. If lateral epicondylitis is left untreated, it may become a chronic problem. SYMPTOMS   Pain, tenderness, and inflammation on the outer (lateral) side of the elbow.  Pain or weakness with gripping activities.  Pain that increases with wrist-twisting motions (playing tennis, using a screwdriver, opening a door or a jar).  Pain with lifting objects, including a coffee cup. CAUSES  Lateral epicondylitis is caused by inflammation of the tendons that extend the wrist. Causes of injury may include:  Repetitive stress and strain on the muscles and tendons that extend the wrist.  Sudden change in activity level or intensity.  Incorrect grip in racquet sports.  Incorrect grip size of racquet (often too large).  Incorrect hitting position or technique (usually backhand, leading with the elbow).  Using a racket that is too heavy. RISK INCREASES WITH:  Sports or occupations that require repetitive and/or strenuous forearm and wrist movements (tennis, squash, racquetball, carpentry).  Poor wrist and forearm strength and flexibility.  Failure to warm up properly before activity.  Resuming activity before healing, rehabilitation, and conditioning are complete. PREVENTION   Warm up and stretch properly before activity.  Maintain physical fitness:  Strength, flexibility, and endurance.  Cardiovascular fitness.  Wear and use properly fitted equipment.  Learn and use proper technique and have a coach correct improper technique.  Wear a tennis elbow (counterforce) brace. PROGNOSIS  The course of  this condition depends on the degree of the injury. If treated properly, acute cases (symptoms lasting less than 4 weeks) are often resolved in 2 to 6 weeks. Chronic (longer lasting cases) often resolve in 3 to 6 months but may require physical therapy. RELATED COMPLICATIONS   Frequently recurring symptoms, resulting in a chronic problem. Properly treating the problem the first time decreases frequency of recurrence.  Chronic inflammation, scarring tendon degeneration, and partial tendon tear, requiring surgery.  Delayed healing or resolution of symptoms. TREATMENT  Treatment first involves the use of ice and medicine to reduce pain and inflammation. Strengthening and stretching exercises may help reduce discomfort if performed regularly. These exercises may be performed at home if the condition is an acute injury. Chronic cases may require a referral to a physical therapist for evaluation and treatment. Your caregiver may advise a corticosteroid injection to help reduce inflammation. Rarely, surgery is needed. MEDICATION  If pain medicine is needed, nonsteroidal anti-inflammatory medicines (aspirin and ibuprofen), or other minor pain relievers (acetaminophen), are often advised.  Do not take pain medicine for 7 days before surgery.  Prescription pain relievers may be given, if your caregiver thinks they are needed. Use only as directed and only as much as you need.  Corticosteroid injections may be recommended. These injections should be reserved only for the most severe cases, because they can only be given a certain number of times. HEAT AND COLD  Cold treatment (icing) should be applied for 10 to 15 minutes every 2 to 3 hours for inflammation and pain, and immediately after activity that aggravates your symptoms. Use ice packs or an ice massage.  Heat treatment may be used before performing stretching and strengthening activities prescribed by your caregiver, physical therapist,   or  athletic trainer. Use a heat pack or a warm water soak. SEEK MEDICAL CARE IF: Symptoms get worse or do not improve in 2 weeks, despite treatment. EXERCISES  RANGE OF MOTION (ROM) AND STRETCHING EXERCISES - Epicondylitis, Lateral (Tennis Elbow) These exercises may help you when beginning to rehabilitate your injury. Your symptoms may go away with or without further involvement from your physician, physical therapist, or athletic trainer. While completing these exercises, remember:   Restoring tissue flexibility helps normal motion to return to the joints. This allows healthier, less painful movement and activity.  An effective stretch should be held for at least 30 seconds.  A stretch should never be painful. You should only feel a gentle lengthening or release in the stretched tissue. RANGE OF MOTION - Wrist Flexion, Active-Assisted  Extend your right / left elbow with your fingers pointing down.*  Gently pull the back of your hand towards you, until you feel a gentle stretch on the top of your forearm.  Hold this position for __________ seconds. Repeat __________ times. Complete this exercise __________ times per day.  *If directed by your physician, physical therapist or athletic trainer, complete this stretch with your elbow bent, rather than extended. RANGE OF MOTION - Wrist Extension, Active-Assisted  Extend your right / left elbow and turn your palm upwards.*  Gently pull your palm and fingertips back, so your wrist extends and your fingers point more toward the ground.  You should feel a gentle stretch on the inside of your forearm.  Hold this position for __________ seconds. Repeat __________ times. Complete this exercise __________ times per day. *If directed by your physician, physical therapist or athletic trainer, complete this stretch with your elbow bent, rather than extended. STRETCH - Wrist Flexion  Place the back of your right / left hand on a tabletop, leaving your  elbow slightly bent. Your fingers should point away from your body.  Gently press the back of your hand down onto the table by straightening your elbow. You should feel a stretch on the top of your forearm.  Hold this position for __________ seconds. Repeat __________ times. Complete this stretch __________ times per day.  STRETCH - Wrist Extension   Place your right / left fingertips on a tabletop, leaving your elbow slightly bent. Your fingers should point backwards.  Gently press your fingers and palm down onto the table by straightening your elbow. You should feel a stretch on the inside of your forearm.  Hold this position for __________ seconds. Repeat __________ times. Complete this stretch __________ times per day.  STRENGTHENING EXERCISES - Epicondylitis, Lateral (Tennis Elbow) These exercises may help you when beginning to rehabilitate your injury. They may resolve your symptoms with or without further involvement from your physician, physical therapist, or athletic trainer. While completing these exercises, remember:   Muscles can gain both the endurance and the strength needed for everyday activities through controlled exercises.  Complete these exercises as instructed by your physician, physical therapist or athletic trainer. Increase the resistance and repetitions only as guided.  You may experience muscle soreness or fatigue, but the pain or discomfort you are trying to eliminate should never worsen during these exercises. If this pain does get worse, stop and make sure you are following the directions exactly. If the pain is still present after adjustments, discontinue the exercise until you can discuss the trouble with your caregiver. STRENGTH - Wrist Flexors  Sit with your right / left forearm palm-up and fully supported   on a table or countertop. Your elbow should be resting below the height of your shoulder. Allow your wrist to extend over the edge of the  surface.  Loosely holding a __________ weight, or a piece of rubber exercise band or tubing, slowly curl your hand up toward your forearm.  Hold this position for __________ seconds. Slowly lower the wrist back to the starting position in a controlled manner. Repeat __________ times. Complete this exercise __________ times per day.  STRENGTH - Wrist Extensors  Sit with your right / left forearm palm-down and fully supported on a table or countertop. Your elbow should be resting below the height of your shoulder. Allow your wrist to extend over the edge of the surface.  Loosely holding a __________ weight, or a piece of rubber exercise band or tubing, slowly curl your hand up toward your forearm.  Hold this position for __________ seconds. Slowly lower the wrist back to the starting position in a controlled manner. Repeat __________ times. Complete this exercise __________ times per day.  STRENGTH - Ulnar Deviators  Stand with a ____________________ weight in your right / left hand, or sit while holding a rubber exercise band or tubing, with your healthy arm supported on a table or countertop.  Move your wrist, so that your pinkie travels toward your forearm and your thumb moves away from your forearm.  Hold this position for __________ seconds and then slowly lower the wrist back to the starting position. Repeat __________ times. Complete this exercise __________ times per day STRENGTH - Radial Deviators  Stand with a ____________________ weight in your right / left hand, or sit while holding a rubber exercise band or tubing, with your injured arm supported on a table or countertop.  Raise your hand upward in front of you or pull up on the rubber tubing.  Hold this position for __________ seconds and then slowly lower the wrist back to the starting position. Repeat __________ times. Complete this exercise __________ times per day. STRENGTH - Forearm Supinators   Sit with your right /  left forearm supported on a table, keeping your elbow below shoulder height. Rest your hand over the edge, palm down.  Gently grip a hammer or a soup ladle.  Without moving your elbow, slowly turn your palm and hand upward to a "thumbs-up" position.  Hold this position for __________ seconds. Slowly return to the starting position. Repeat __________ times. Complete this exercise __________ times per day.  STRENGTH - Forearm Pronators   Sit with your right / left forearm supported on a table, keeping your elbow below shoulder height. Rest your hand over the edge, palm up.  Gently grip a hammer or a soup ladle.  Without moving your elbow, slowly turn your palm and hand upward to a "thumbs-up" position.  Hold this position for __________ seconds. Slowly return to the starting position. Repeat __________ times. Complete this exercise __________ times per day.  STRENGTH - Grip  Grasp a tennis ball, a dense sponge, or a large, rolled sock in your hand.  Squeeze as hard as you can, without increasing any pain.  Hold this position for __________ seconds. Release your grip slowly. Repeat __________ times. Complete this exercise __________ times per day.  STRENGTH - Elbow Extensors, Isometric  Stand or sit upright, on a firm surface. Place your right / left arm so that your palm faces your stomach, and it is at the height of your waist.  Place your opposite hand on the underside   of your forearm. Gently push up as your right / left arm resists. Push as hard as you can with both arms, without causing any pain or movement at your right / left elbow. Hold this stationary position for __________ seconds. Gradually release the tension in both arms. Allow your muscles to relax completely before repeating.   This information is not intended to replace advice given to you by your health care provider. Make sure you discuss any questions you have with your health care provider.   Document Released:  04/16/2005 Document Revised: 05/07/2014 Document Reviewed: 07/29/2008 Elsevier Interactive Patient Education 2016 [employer]  

## 2016-02-16 ENCOUNTER — Other Ambulatory Visit: Payer: Self-pay | Admitting: Family Medicine

## 2016-04-07 ENCOUNTER — Other Ambulatory Visit: Payer: Self-pay | Admitting: Physician Assistant

## 2016-05-08 ENCOUNTER — Encounter: Payer: Self-pay | Admitting: Physician Assistant

## 2016-05-08 ENCOUNTER — Ambulatory Visit (INDEPENDENT_AMBULATORY_CARE_PROVIDER_SITE_OTHER): Payer: 59 | Admitting: Physician Assistant

## 2016-05-08 VITALS — BP 127/69 | HR 88 | Ht 64.0 in | Wt 249.0 lb

## 2016-05-08 DIAGNOSIS — L853 Xerosis cutis: Secondary | ICD-10-CM

## 2016-05-08 DIAGNOSIS — M79605 Pain in left leg: Secondary | ICD-10-CM

## 2016-05-08 MED ORDER — TRIAMCINOLONE ACETONIDE 0.1 % EX CREA: 1.0000 "application " | TOPICAL_CREAM | Freq: Two times a day (BID) | CUTANEOUS | 0 refills | Status: DC

## 2016-05-08 NOTE — Progress Notes (Signed)
   Subjective:    Patient ID: Yolanda Mosley, female    DOB: [birth year], 36 y.o.   MRN: [id number]  HPI  Pt is a 36 yo female who presents to the clinic for hospital follow up from visit at [facility] on 05/04/2016 for left leg pain. She noticed it that morning with burning in shin and started to radiate up leg. Leg was cold and thigh felt swollen. D-dimer was elevated and doppler of left leg was negative as well as CTA to rule out PE. Leg pain spontaneously resolved in ED and has not returned. She denies any symptoms today of leg pain. She has no SOB. There is no tenderness over leg. She denies any new back pain or injury.   She does have some tiny red bumps over arm and legs that are itchy. Not tried anything to make better.     Review of Systems  All other systems reviewed and are negative.      Objective:   Physical Exam  Constitutional: She is oriented to person, place, and time. She appears well-developed and well-nourished.  HENT:  Head: Normocephalic and atraumatic.  Neurological: She is alert and oriented to person, place, and time.  Skin:  Tiny red papules over bilateral lateral arm and backs of both legs.   Psychiatric: She has a normal mood and affect. Her behavior is normal.          Assessment & Plan:  Marland KitchenMarland KitchenAmber was seen today for hospitalization follow-up and rash.  Diagnoses and all orders for this visit:  Left leg pain  Dry skin dermatitis -     triamcinolone cream (KENALOG) 0.1 %; Apply 1 application topically 2 (two) times daily.  Other orders -     Discontinue: triamcinolone cream (KENALOG) 0.1 %; Apply 1 application topically 2 (two) times daily.  reassured patient leg exam was normal today. If symptoms reoccur call office for same day appt.   Discussed dry skin or some keratosis pilaris symptomatic care discussed. Topical steroids given to use as need in combination with moisturizer. Follow up as needed.

## 2016-05-08 NOTE — Patient Instructions (Signed)
aveeno  cetaphil

## 2016-05-23 ENCOUNTER — Other Ambulatory Visit: Payer: Self-pay | Admitting: Family Medicine

## 2016-05-23 DIAGNOSIS — I10 Essential (primary) hypertension: Secondary | ICD-10-CM

## 2016-05-28 ENCOUNTER — Other Ambulatory Visit: Payer: Self-pay | Admitting: Family Medicine

## 2016-05-28 DIAGNOSIS — I10 Essential (primary) hypertension: Secondary | ICD-10-CM

## 2016-06-01 ENCOUNTER — Other Ambulatory Visit: Payer: Self-pay | Admitting: Family Medicine

## 2016-06-07 ENCOUNTER — Telehealth: Payer: Self-pay | Admitting: Family Medicine

## 2016-06-07 ENCOUNTER — Ambulatory Visit (INDEPENDENT_AMBULATORY_CARE_PROVIDER_SITE_OTHER): Payer: 59 | Admitting: Family Medicine

## 2016-06-07 ENCOUNTER — Encounter: Payer: Self-pay | Admitting: Family Medicine

## 2016-06-07 VITALS — BP 115/64 | HR 88 | Ht 64.0 in | Wt 249.0 lb

## 2016-06-07 DIAGNOSIS — K649 Unspecified hemorrhoids: Secondary | ICD-10-CM | POA: Diagnosis not present

## 2016-06-07 DIAGNOSIS — I10 Essential (primary) hypertension: Secondary | ICD-10-CM

## 2016-06-07 DIAGNOSIS — F902 Attention-deficit hyperactivity disorder, combined type: Secondary | ICD-10-CM

## 2016-06-07 MED ORDER — BENAZEPRIL-HYDROCHLOROTHIAZIDE 10-12.5 MG PO TABS: 1.0000 | ORAL_TABLET | Freq: Every day | ORAL | 1 refills | Status: DC

## 2016-06-07 MED ORDER — LIDOCAINE-HYDROCORTISONE ACE 3-1 % RE KIT: 1.0000 "application " | PACK | Freq: Two times a day (BID) | RECTAL | 99 refills | Status: DC

## 2016-06-07 NOTE — Telephone Encounter (Signed)
Ok to change Rx to the [facility] and then can by lidocaine gel OtC>

## 2016-06-07 NOTE — Telephone Encounter (Signed)
Pharmacy ([facility]) called, Pt's insurance does not cover the anamantle kit but they do cover proctosol-hc 2.5% rectal cream. Routing to PCP for new Rx if appropriate.

## 2016-06-07 NOTE — Progress Notes (Signed)
   Subjective:    Patient ID: Yolanda Mosley, female    DOB: [birth year], 36 y.o.   MRN: [id number]  HPI Subjective:    CC: HTN  HPI:  Hypertension- Pt denies chest pain, SOB, dizziness, or heart palpitations.  Taking meds as directed w/o problems.  Denies medication side effects.    ADD - She is doing well overall. No chest pain shortest of breath palpitations or insomnia on the medication. She still likes the Vyvanse past and feels like it's more effective for her.  Is also been struggling for her hemorrhoids for the last couple months. She has internal hemorrhoids. She said she had surgery on him a few years ago and it helped for a short period of time. She says she does try to make sure that her stools are nice and soft but she continues to have irritation bleeding and itching and burning after bowel movement.  Past medical history, Surgical history, Family history not pertinant except as noted below, Social history, Allergies, and medications have been entered into the medical record, reviewed, and corrections made.   Review of Systems: No fevers, chills, night sweats, weight loss, chest pain, or shortness of breath.   Objective:    General: Well Developed, well nourished, and in no acute distress.  Neuro: Alert and oriented x3, extra-ocular muscles intact, sensation grossly intact.  HEENT: Normocephalic, atraumatic  Skin: Warm and dry, no rashes. Cardiac: Regular rate and rhythm, no murmurs rubs or gallops, no lower extremity edema.  Respiratory: Clear to auscultation bilaterally. Not using accessory muscles, speaking in full sentences.   Impression and Recommendations:       Review of Systems     Objective:   Physical Exam  Constitutional: She is oriented to person, place, and time. She appears well-developed and well-nourished.  HENT:  Head: Normocephalic and atraumatic.  Cardiovascular: Normal rate, regular rhythm and normal heart sounds.   Pulmonary/Chest: Effort  normal and breath sounds normal.  Neurological: She is alert and oriented to person, place, and time.  Skin: Skin is warm and dry.  Psychiatric: She has a normal mood and affect. Her behavior is normal.        Assessment & Plan:  HTN - Well controlled. Continue current regimen. Follow up in 6 months or children due for lab work at that time.  ADD - well controlled. Continue current regimen. She does not need a refill today. Follow-up in 4-6  months area and we can always switch her to Vyvanse if she finds that it is now covered with her insurance that shear.  Hemorrhoids-we will try switching to an a mantle. New prescription sent. Has a combination of the steroid and lidocaine. Recommend sitz baths as well and making sure that the stools are nice and soft. He continues to be problematic and otherwise consider referring her back to general surgery for further treatment options.

## 2016-06-08 MED ORDER — HYDROCORTISONE ACETATE 25 MG RE SUPP: 25.0000 mg | Freq: Two times a day (BID) | RECTAL | 0 refills | Status: DC

## 2016-06-08 NOTE — Telephone Encounter (Signed)
Left generic VM for Pt advising of new Rx. Callback provided for questions.

## 2016-07-05 ENCOUNTER — Ambulatory Visit (INDEPENDENT_AMBULATORY_CARE_PROVIDER_SITE_OTHER): Payer: 59

## 2016-07-05 ENCOUNTER — Encounter: Payer: Self-pay | Admitting: Family Medicine

## 2016-07-05 ENCOUNTER — Ambulatory Visit (INDEPENDENT_AMBULATORY_CARE_PROVIDER_SITE_OTHER): Payer: 59 | Admitting: Family Medicine

## 2016-07-05 ENCOUNTER — Other Ambulatory Visit: Payer: Self-pay | Admitting: Family Medicine

## 2016-07-05 VITALS — BP 119/75 | HR 87 | Wt 252.0 lb

## 2016-07-05 DIAGNOSIS — M25472 Effusion, left ankle: Secondary | ICD-10-CM | POA: Diagnosis not present

## 2016-07-05 DIAGNOSIS — M7732 Calcaneal spur, left foot: Secondary | ICD-10-CM | POA: Diagnosis not present

## 2016-07-05 DIAGNOSIS — M25572 Pain in left ankle and joints of left foot: Secondary | ICD-10-CM

## 2016-07-05 NOTE — Progress Notes (Signed)
Yolanda Mosley is a 36 y.o. female who presents to [place] today for left ankle pain. Patient injured her ankle a few days ago. She is walking and either inverted or everted her ankle (she is not sure which). She has medial ankle pain. She notes that she's been walking on her toes to help alleviate the pain. She notes medial ankle pain and swelling pain worse with activity better with rest. No radiating pain weakness or numbness. She's tried some over-the-counter medications which have not helped much at all. She feels well otherwise. No fevers or chills.    Past Medical History:  Diagnosis Date  . Asthma   . 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 Hemorrhoids    laser tx   . Herpes simplex virus (HSV) infection   . Hypertension    Past Surgical History:  Procedure Laterality Date  . ovary, bilater fallopian tubes removed  2005   Social History  Substance Use Topics  . Smoking status: Never Smoker  . Smokeless tobacco: Never Used  . Alcohol use Yes     Comment: occasionally     ROS:  As above   Medications: Current Outpatient Prescriptions  Medication Sig Dispense Refill  . acyclovir (ZOVIRAX) 400 MG tablet TAKE 2 TABLETS BY MOUTH FIVE TIMES DAILY 70 tablet 1  . atenolol (TENORMIN) 50 MG tablet TAKE 1 TABLET (50 MG TOTAL) BY MOUTH DAILY. 90 tablet 1  . benazepril-hydrochlorthiazide (LOTENSIN HCT) 10-12.5 MG tablet Take 1 tablet by mouth daily. 90 tablet 1  . [facility] ALLERGY RELIEF 180 MG tablet TAKE 1 TABLET (180 MG TOTAL) BY MOUTH DAILY. 90 tablet 0  . dexmethylphenidate (FOCALIN) 10 MG tablet Take 1 tablet (10 mg total) by mouth 2 (two) times daily. 180 tablet 0  . hydrocortisone (ANUSOL-HC) 25 MG suppository Place 1 suppository (25 mg total) rectally 2 (two) times daily. 12 suppository 0  . olopatadine (PATANOL) 0.1 % ophthalmic solution Place 1 drop into both eyes 2 (two) times daily as needed (red or itching eyes). 5 mL  11  . omeprazole (PRILOSEC) 40 MG capsule TAKE 1 CAPSULE (40 MG TOTAL) BY MOUTH DAILY. 90 capsule 3  . PROAIR HFA 108 (90 Base) MCG/ACT inhaler INHALE 1 TO 2 PUFFS INTO THE LUNGS EVERY 6 HOURS AS NEEDED FOR WHEEZING 8.5 Inhaler 0  . triamcinolone cream (KENALOG) 0.1 % Apply 1 application topically 2 (two) times daily. 80 g 0   No current facility-administered medications for this visit.    Allergies  Allergen Reactions  . Other Rash  . Hydrocodone Other (See Comments)    Dizzy , nauseous, swimmy headed.(Can take with phenergan)  . Keflex [Cephalexin]     Diarrhea and more abdominal cramping  . Codeine Nausea Only  . Tape Rash     Exam:  BP 119/75   Pulse 87   Wt 252 lb (114.3 kg)   BMI 43.26 kg/m  General: Well Developed, well nourished, and in no acute distress.  Neuro/Psych: Alert and oriented x3, extra-ocular muscles intact, able to move all 4 extremities, sensation grossly intact. Skin: Warm and dry, no rashes noted.  Respiratory: Not using accessory muscles, speaking in full sentences, trachea midline.  Cardiovascular: Pulses palpable, no extremity edema. Abdomen: Does not appear distended. MSK: Left ankle: Mildly swollen medially. Tender palpation medial ankle on long course of the posterior tibialis tendon. Pain is resistant foot eversion. Range of motion intact. Pulses  capillary refill and sensation intact. Stable ligamentous exam.  Left ankle x-ray shows no obvious fractures. Awaiting formal radiology review    No results found for this or any previous visit (from the past 48 hour(s)). No results found.    Assessment and Plan: 36 y.o. 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 I'm not quite sure how this relates to recent injury.  She's been limping and not cause the tendinopathy. Plan for relative rest and a cam walker boot. Recheck in 1-2 weeks.    Orders Placed This Encounter  Procedures  . DG Ankle Complete Right     Standing Status:   Future    Number of Occurrences:   1    Standing Expiration Date:   09/04/2017    Order Specific Question:   Reason for Exam (SYMPTOM  OR DIAGNOSIS REQUIRED)    Answer:   eval ankle injury    Order Specific Question:   Is patient pregnant?    Answer:   No    Order Specific Question:   Preferred imaging location?    Answer:   [facility]    Discussed warning signs or symptoms. Please see discharge instructions. Patient expresses understanding.

## 2016-07-05 NOTE — Patient Instructions (Signed)
Thank you for coming in today. Use the cam walker boot as needed.  Recheck in 1-2 weeks Work on ankle motion at home.   Posterior Tibialis Tendinosis Posterior tibialis tendinosis is irritation and degeneration of a tendon called the posterior tibial tendon. Your posterior tibial tendon is a cord-like tissue that connects bones of your lower leg and foot to a muscle that:  Supports your arch.  Helps you raise up on your toes.  Helps you turn your foot down and in. This condition causes foot and ankle pain and can lead to a flat foot. What are the causes? This condition is most often caused by repeated stress to the tendon (overuse injury). It can also be caused by a sudden injury that stresses the tendon, such as landing on your foot after jumping or falling. What increases the risk? This condition is more likely to develop in:  People who play a sport that involves putting a lot of pressure on the feet, such as:  Basketball.  Tennis.  Soccer.  Hockey.  Runners.  Females who are older than 40 years and are overweight.  People with diabetes.  People with decreased foot stability (ligamentous laxity).  People with flat feet. What are the signs or symptoms? Symptoms of this condition may start suddenly or gradually. Symptoms include:  Pain in the inner ankle.  Pain at the arch of your foot.  Pain that gets worse with running, walking, or standing.  Swelling on the inside of your ankle and foot.  Weakness in your ankle or foot.  Inability to stand up on tiptoe. How is this diagnosed? This condition may be diagnosed based on:  Your symptoms.  Your medical history.  A physical exam.  Tests, such as:  An X-ray.  MRI.  An ultrasound. During the physical exam, your health care provider may move your foot and ankle, test your strength and balance, and check the arch of your foot while you stand or walk. How is this treated? This condition may be treated  by:  Replacing high-impact exercise with low-impact exercise, such as swimming or cycling.  Applying ice to the injured area.  Taking an anti-inflammatory pain medicine.  Physical therapy.  Wearing a special shoe or shoe insert to support your arch (orthotic). If your symptoms do not improve with these treatments, you may need to wear a splint, removable walking boot, or short leg cast for 6-8 weeks to keep your foot and ankle still. Follow these instructions at home: If you have a boot or splint:   Wear the boot or splint as told by your health care provider. Remove it only as told by your health care provider.  Do not use your foot to support (bear) your full body weight until your health care provider says that you can.  Loosen the boot or splint if your toes tingle, become numb, or turn cold and blue.  Keep the boot or splint clean.  If your boot or splint is not waterproof:  Do not let it get wet.  Cover it with a watertight plastic bag when you take a bath or shower. If you have a cast:   Do not stick anything inside the cast to scratch your skin. Doing that increases your risk of infection.  Check the skin around the cast every day. Tell your health care provider about any concerns.  You may put lotion on dry skin around the edges of the cast. Do not apply lotion to the skin  underneath the cast.  Keep the cast clean.  Do not take baths, swim, or use a hot tub until your health care provider approves. Ask your health care provider if you can take showers. You may only be allowed to take sponge baths for bathing.  If your cast is not waterproof:  Do not let it get wet.  Cover it with a watertight plastic bag while you take a bath or a shower. Managing pain and swelling   Take over-the-counter and prescription medicines only as told by your health care provider.  If directed, apply ice to the injured area:  Put ice in a plastic bag.  Place a towel between your  skin and the bag.  Leave the ice on for 20 minutes, 2-3 times a day.  Raise (elevate) your ankle above the level of your heart when resting if you have swelling. Activity   Do not do activities that make pain or swelling worse.  Return to full activity gradually as symptoms improve.  Do exercises as told by your health care provider. General instructions   If you have an orthotic, use it as told by your health care provider.  Keep all follow-up visits as told by your health care provider. This is important. How is this prevented?  Wear footwear that is appropriate to your athletic activity.  Avoid athletic activities that cause pain or swelling in your ankle or foot.  Before being active, do range-of-motion and stretching exercises.  If you develop pain or swelling while training, stop training.  If you have pain or swelling that does not improve after a few days of rest, see your health care provider.  If you start a new athletic activity, start gradually so you can build up your strength and flexibility. Contact a health care provider if:  Your symptoms get worse.  Your symptoms do not improve in 6-8 weeks.  You develop new, unexplained symptoms.  Your splint, boot, or cast gets damaged. This information is not intended to replace advice given to you by your health care provider. Make sure you discuss any questions you have with your health care provider. Document Released: 04/16/2005 Document Revised: 12/20/2015 Document Reviewed: 12/31/2014 Elsevier Interactive Patient Education  2017 [employer]     Posterior Tibialis Tendinosis Rehab Ask your health care provider which exercises are safe for you. Do exercises exactly as told by your health care provider and adjust them as directed. It is normal to feel mild stretching, pulling, tightness, or discomfort as you do these exercises, but you should stop right away if you feel sudden pain or your pain gets worse.Do  not begin these exercises until told by your health care provider. Stretching and range of motion exercises These exercises warm up your muscles and joints and improve the movement and flexibility in your ankle and foot. These exercises may also help to relieve pain. Exercise A: Standing wall calf stretch, knee straight   1. Stand with your hands against a wall. 2. Extend your __________ leg behind you, and bend your front knee slightly. Keep both of your heels on the floor. 3. Point the toes of your back foot slightly inward. 4. Keeping your heels on the floor and your back knee straight, shift your weight toward the wall. Do not allow your back to arch. You should feel a gentle stretch in the back of your lower leg (calf). 5. Hold this position for __________ seconds. Repeat __________ times. Complete this stretch __________ times a  day. Exercise B: Standing wall calf stretch, knee bent  1. Stand with your hands against a wall. 2. Extend your __________ leg behind you, and bend your front knee slightly. Keep both of your heels on the floor. 3. Point the toes of your back foot slightly inward. 4. Unlock your back knee so it is bent. Keep your heels on the floor. You should feel a gentle stretch deep in your calf. 5. Hold this position for __________ seconds. Repeat __________ times. Complete this exercise __________ times a day. Strengthening exercises These exercises build strength and endurance in your ankle and foot. Endurance is the ability to use your muscles for a long time, even after they get tired. Exercise C: Ankle inversion with band  1. Secure one end of an exercise band or tubing to a fixed object, such as a table leg or a pole, that will stay still when the band is pulled. to an object that will not move if it is pulled on, like a table leg. 2. Loop the other end of the band around the middle of your left / right foot. 3. Sit on the floor facing the object with your __________  leg extended. The band or tube should be slightly tense when your foot is relaxed. 4. Leading with your big toe, slowly bring your __________ foot and ankle inward, toward your other foot. 5. Hold this position for __________ seconds. 6. Slowly return your foot to the starting position. Repeat __________ times. Complete this exercise __________ times a day. Exercise D: Towel curls   1. Sit in a chair on a non-carpeted surface, and put your feet on the floor. 2. Place a towel in front of your feet. If told by your health care provider, add __________ at the end of the towel. 3. Keeping your heel on the floor, put your __________ foot on the towel. 4. Pull the towel toward you by grabbing the towel with your toes and curling them under. Keep your heel on the floor. 5. Let your toes relax. 6. Grab the towel with your toes again. Keep going until the towel is completely underneath your foot. Repeat __________ times. Complete this exercise __________ times a day. Balance exercises These exercises improve or maintain your balance. Balance is important in preventing falls. Exercise E: Single leg stand  1. Without shoes, stand near a railing or in a doorway. You can hold on to the railing or door frame as needed for balance. 2. Stand on your __________ foot. Keep your big toe down on the floor and try to keep your arch lifted. If balancing in this position is too easy, try the exercise with your eyes closed or while standing on a pillow. 3. Hold this position for __________ seconds. Repeat __________ times. Complete this exercise __________ times a day. This information is not intended to replace advice given to you by your health care provider. Make sure you discuss any questions you have with your health care provider. Document Released: 04/16/2005 Document Revised: 12/20/2015 Document Reviewed: 12/31/2014 Elsevier Interactive Patient Education  2017 [employer].

## 2016-07-12 ENCOUNTER — Encounter: Payer: Self-pay | Admitting: Family Medicine

## 2016-07-12 ENCOUNTER — Ambulatory Visit (INDEPENDENT_AMBULATORY_CARE_PROVIDER_SITE_OTHER): Payer: 59 | Admitting: Family Medicine

## 2016-07-12 DIAGNOSIS — M76822 Posterior tibial tendinitis, left leg: Secondary | ICD-10-CM | POA: Diagnosis not present

## 2016-07-12 NOTE — Patient Instructions (Signed)
Thank you for coming in today. Do the home exercises. Use the band Also do pigeon toe toe standing.     Posterior Tibialis Tendinosis Rehab Ask your health care provider which exercises are safe for you. Do exercises exactly as told by your health care provider and adjust them as directed. It is normal to feel mild stretching, pulling, tightness, or discomfort as you do these exercises, but you should stop right away if you feel sudden pain or your pain gets worse.Do not begin these exercises until told by your health care provider. Stretching and range of motion exercises These exercises warm up your muscles and joints and improve the movement and flexibility in your ankle and foot. These exercises may also help to relieve pain. Exercise A: Standing wall calf stretch, knee straight   1. Stand with your hands against a wall. 2. Extend your __________ leg behind you, and bend your front knee slightly. Keep both of your heels on the floor. 3. Point the toes of your back foot slightly inward. 4. Keeping your heels on the floor and your back knee straight, shift your weight toward the wall. Do not allow your back to arch. You should feel a gentle stretch in the back of your lower leg (calf). 5. Hold this position for __________ seconds. Repeat __________ times. Complete this stretch __________ times a day. Exercise B: Standing wall calf stretch, knee bent  1. Stand with your hands against a wall. 2. Extend your __________ leg behind you, and bend your front knee slightly. Keep both of your heels on the floor. 3. Point the toes of your back foot slightly inward. 4. Unlock your back knee so it is bent. Keep your heels on the floor. You should feel a gentle stretch deep in your calf. 5. Hold this position for __________ seconds. Repeat __________ times. Complete this exercise __________ times a day. Strengthening exercises These exercises build strength and endurance in your ankle and foot.  Endurance is the ability to use your muscles for a long time, even after they get tired. Exercise C: Ankle inversion with band  1. Secure one end of an exercise band or tubing to a fixed object, such as a table leg or a pole, that will stay still when the band is pulled. to an object that will not move if it is pulled on, like a table leg. 2. Loop the other end of the band around the middle of your left / right foot. 3. Sit on the floor facing the object with your __________ leg extended. The band or tube should be slightly tense when your foot is relaxed. 4. Leading with your big toe, slowly bring your __________ foot and ankle inward, toward your other foot. 5. Hold this position for __________ seconds. 6. Slowly return your foot to the starting position. Repeat __________ times. Complete this exercise __________ times a day. Exercise D: Towel curls   1. Sit in a chair on a non-carpeted surface, and put your feet on the floor. 2. Place a towel in front of your feet. If told by your health care provider, add __________ at the end of the towel. 3. Keeping your heel on the floor, put your __________ foot on the towel. 4. Pull the towel toward you by grabbing the towel with your toes and curling them under. Keep your heel on the floor. 5. Let your toes relax. 6. Grab the towel with your toes again. Keep going until the towel is completely underneath  your foot. Repeat __________ times. Complete this exercise __________ times a day. Balance exercises These exercises improve or maintain your balance. Balance is important in preventing falls. Exercise E: Single leg stand  1. Without shoes, stand near a railing or in a doorway. You can hold on to the railing or door frame as needed for balance. 2. Stand on your __________ foot. Keep your big toe down on the floor and try to keep your arch lifted. If balancing in this position is too easy, try the exercise with your eyes closed or while standing on a  pillow. 3. Hold this position for __________ seconds. Repeat __________ times. Complete this exercise __________ times a day. This information is not intended to replace advice given to you by your health care provider. Make sure you discuss any questions you have with your health care provider. Document Released: 04/16/2005 Document Revised: 12/20/2015 Document Reviewed: 12/31/2014 Elsevier Interactive Patient Education  2017 [employer].

## 2016-07-12 NOTE — Progress Notes (Signed)
Yolanda Mosley is a 36 y.o. female who presents to [facility] today for left ankle pain. Patient was seen March 8 for left ankle pain thought to be posterior tibialis tendinopathy. She was placed in a cam walker boot and notes significant improvement in symptoms. She's tried walking a bit out of the boot and notes her pain is much improved but still mildly present.   Past Medical History:  Diagnosis Date  . Asthma   . 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 Hemorrhoids    laser tx   . Herpes simplex virus (HSV) infection   . Hypertension    Past Surgical History:  Procedure Laterality Date  . ovary, bilater fallopian tubes removed  2005   Social History  Substance Use Topics  . Smoking status: Never Smoker  . Smokeless tobacco: Never Used  . Alcohol use Yes     Comment: occasionally     ROS:  As above   Medications: Current Outpatient Prescriptions  Medication Sig Dispense Refill  . acyclovir (ZOVIRAX) 400 MG tablet TAKE 2 TABLETS BY MOUTH FIVE TIMES DAILY 70 tablet 1  . atenolol (TENORMIN) 50 MG tablet TAKE 1 TABLET (50 MG TOTAL) BY MOUTH DAILY. 90 tablet 1  . benazepril-hydrochlorthiazide (LOTENSIN HCT) 10-12.5 MG tablet Take 1 tablet by mouth daily. 90 tablet 1  . [facility] ALLERGY RELIEF 180 MG tablet TAKE 1 TABLET (180 MG TOTAL) BY MOUTH DAILY. 90 tablet 0  . dexmethylphenidate (FOCALIN) 10 MG tablet Take 1 tablet (10 mg total) by mouth 2 (two) times daily. 180 tablet 0  . hydrocortisone (ANUSOL-HC) 25 MG suppository Place 1 suppository (25 mg total) rectally 2 (two) times daily. 12 suppository 0  . olopatadine (PATANOL) 0.1 % ophthalmic solution Place 1 drop into both eyes 2 (two) times daily as needed (red or itching eyes). 5 mL 11  . omeprazole (PRILOSEC) 40 MG capsule TAKE 1 CAPSULE (40 MG TOTAL) BY MOUTH DAILY. 90 capsule 3  . PROAIR HFA 108 (90 Base) MCG/ACT inhaler INHALE 1 TO 2 PUFFS INTO THE LUNGS EVERY 6 HOURS AS  NEEDED FOR WHEEZING 8.5 Inhaler 0  . triamcinolone cream (KENALOG) 0.1 % Apply 1 application topically 2 (two) times daily. 80 g 0   No current facility-administered medications for this visit.    Allergies  Allergen Reactions  . Other Rash  . Hydrocodone Other (See Comments)    Dizzy , nauseous, swimmy headed.(Can take with phenergan)  . Keflex [Cephalexin]     Diarrhea and more abdominal cramping  . Codeine Nausea Only  . Tape Rash     Exam:  BP 121/71   Pulse 75   Wt 254 lb (115.2 kg)   BMI 43.60 kg/m  General: Well Developed, well nourished, and in no acute distress.  Neuro/Psych: Alert and oriented x3, extra-ocular muscles intact, able to move all 4 extremities, sensation grossly intact. Skin: Warm and dry, no rashes noted.  Respiratory: Not using accessory muscles, speaking in full sentences, trachea midline.  Cardiovascular: Pulses palpable, no extremity edema. Abdomen: Does not appear distended. MSK: Left ankle unremarkable appearing. Tender to palpation posterior aspect of the medial malleolus. Pain with resisted foot eversion.  Pulses capillary refill and sensation intact distally. Stable ligamentous exam.  Limited musculoskeletal ultrasound of the left ankle:  Posterior aspect of the medial malleolus visualized and normal bony structures. Posterior tibialis tendon is intact however surrounded by hypoechoic fluid within the  tendon sheath indicating tendinopathy. No tender defects visible. Achilles tendon is normal-appearing    No results found for this or any previous visit (from the past 48 hour(s)). No results found.    Assessment and Plan: 36 y.o. female with left ankle posterior tibialis tendinopathy. Discussed options. Plan to wean out of boot and complete home exercise program. If not better will proceed with physical therapy.    No orders of the defined types were placed in this encounter.   Discussed warning signs or symptoms. Please see discharge  instructions. Patient expresses understanding.

## 2016-08-09 ENCOUNTER — Ambulatory Visit (INDEPENDENT_AMBULATORY_CARE_PROVIDER_SITE_OTHER): Payer: 59 | Admitting: Family Medicine

## 2016-08-09 DIAGNOSIS — M76822 Posterior tibial tendinitis, left leg: Secondary | ICD-10-CM | POA: Diagnosis not present

## 2016-08-09 NOTE — Progress Notes (Signed)
Yolanda Mosley is a 36 y.o. female who presents to [place] today for follow left medial ankle pain. Patient has been seen several times for left ankle pain thought to be due to posterior tibial tendinitis. In the interim she has been advancing her activity as tolerated without any immobilization and is completely asymptomatic. She has resumed full level of activity and feels great.   Past Medical History:  Diagnosis Date  . Asthma   . 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 Hemorrhoids    laser tx   . Herpes simplex virus (HSV) infection   . Hypertension    Past Surgical History:  Procedure Laterality Date  . ovary, bilater fallopian tubes removed  2005   Social History  Substance Use Topics  . Smoking status: Never Smoker  . Smokeless tobacco: Never Used  . Alcohol use Yes     Comment: occasionally     ROS:  As above   Medications: Current Outpatient Prescriptions  Medication Sig Dispense Refill  . acyclovir (ZOVIRAX) 400 MG tablet TAKE 2 TABLETS BY MOUTH FIVE TIMES DAILY 70 tablet 1  . atenolol (TENORMIN) 50 MG tablet TAKE 1 TABLET (50 MG TOTAL) BY MOUTH DAILY. 90 tablet 1  . benazepril-hydrochlorthiazide (LOTENSIN HCT) 10-12.5 MG tablet Take 1 tablet by mouth daily. 90 tablet 1  . [facility] ALLERGY RELIEF 180 MG tablet TAKE 1 TABLET (180 MG TOTAL) BY MOUTH DAILY. 90 tablet 0  . dexmethylphenidate (FOCALIN) 10 MG tablet Take 1 tablet (10 mg total) by mouth 2 (two) times daily. 180 tablet 0  . hydrocortisone (ANUSOL-HC) 25 MG suppository Place 1 suppository (25 mg total) rectally 2 (two) times daily. 12 suppository 0  . olopatadine (PATANOL) 0.1 % ophthalmic solution Place 1 drop into both eyes 2 (two) times daily as needed (red or itching eyes). 5 mL 11  . omeprazole (PRILOSEC) 40 MG capsule TAKE 1 CAPSULE (40 MG TOTAL) BY MOUTH DAILY. 90 capsule 3  . PROAIR HFA 108 (90 Base) MCG/ACT inhaler INHALE 1 TO 2 PUFFS INTO THE  LUNGS EVERY 6 HOURS AS NEEDED FOR WHEEZING 8.5 Inhaler 0  . triamcinolone cream (KENALOG) 0.1 % Apply 1 application topically 2 (two) times daily. 80 g 0   No current facility-administered medications for this visit.    Allergies  Allergen Reactions  . Other Rash  . Hydrocodone Other (See Comments)    Dizzy , nauseous, swimmy headed.(Can take with phenergan)  . Keflex [Cephalexin]     Diarrhea and more abdominal cramping  . Codeine Nausea Only  . Tape Rash     Exam:  Heart rate 80 bpm General: Well Developed, well nourished, and in no acute distress.  Neuro/Psych: Alert and oriented x3, extra-ocular muscles intact, able to move all 4 extremities, sensation grossly intact. Skin: Warm and dry, no rashes noted.  Respiratory: Not using accessory muscles, speaking in full sentences, trachea midline.  Cardiovascular: Pulses palpable, no extremity edema. Abdomen: Does not appear distended. MSK: Left foot and ankle normal-appearing nontender normal motion.    No results found for this or any previous visit (from the past 48 hour(s)). No results found.    Assessment and Plan: 36 y.o. female with resolved posterior tibialis tendinitis. Recommend continued home exercises for prevention. Return as needed.    No orders of the defined types were placed in this encounter.   Discussed warning signs or symptoms. Please see discharge instructions. Patient  expresses understanding.

## 2016-08-09 NOTE — Patient Instructions (Signed)
Thank you for coming in today. Continue home exercises.  Return as needed.   Yolanda Mosley!!!

## 2016-08-16 ENCOUNTER — Ambulatory Visit (INDEPENDENT_AMBULATORY_CARE_PROVIDER_SITE_OTHER): Payer: 59

## 2016-08-16 ENCOUNTER — Ambulatory Visit (INDEPENDENT_AMBULATORY_CARE_PROVIDER_SITE_OTHER): Payer: 59 | Admitting: Physician Assistant

## 2016-08-16 ENCOUNTER — Encounter: Payer: Self-pay | Admitting: Physician Assistant

## 2016-08-16 VITALS — BP 126/85 | HR 79 | Wt 253.0 lb

## 2016-08-16 DIAGNOSIS — Z8543 Personal history of malignant neoplasm of ovary: Secondary | ICD-10-CM | POA: Diagnosis not present

## 2016-08-16 DIAGNOSIS — K219 Gastro-esophageal reflux disease without esophagitis: Secondary | ICD-10-CM | POA: Diagnosis not present

## 2016-08-16 DIAGNOSIS — N83202 Unspecified ovarian cyst, left side: Secondary | ICD-10-CM | POA: Diagnosis not present

## 2016-08-16 DIAGNOSIS — Z90721 Acquired absence of ovaries, unilateral: Secondary | ICD-10-CM

## 2016-08-16 DIAGNOSIS — R1032 Left lower quadrant pain: Secondary | ICD-10-CM | POA: Diagnosis not present

## 2016-08-16 DIAGNOSIS — K2100 Gastro-esophageal reflux disease with esophagitis, without bleeding: Secondary | ICD-10-CM

## 2016-08-16 HISTORY — DX: Gastro-esophageal reflux disease with esophagitis, without bleeding: K21.00

## 2016-08-16 LAB — POCT URINALYSIS DIPSTICK
BILIRUBIN UA: NEGATIVE
GLUCOSE UA: NEGATIVE
KETONES UA: NEGATIVE
Leukocytes, UA: NEGATIVE
NITRITE UA: NEGATIVE
Protein, UA: NEGATIVE
RBC UA: NEGATIVE
Spec Grav, UA: >=1.03 — AB (ref 1.010–1.025)
Urobilinogen, UA: 0.2 E.U./dL
pH, UA: 6 (ref 5.0–8.0)

## 2016-08-16 LAB — POCT URINE PREGNANCY: Preg Test, Ur: NEGATIVE

## 2016-08-16 MED ORDER — CYCLOBENZAPRINE HCL 5 MG PO TABS: 5.0000 mg | ORAL_TABLET | Freq: Three times a day (TID) | ORAL | 0 refills | Status: DC | PRN

## 2016-08-16 MED ORDER — KETOROLAC TROMETHAMINE 30 MG/ML IJ SOLN
30.0000 mg | Freq: Once | INTRAMUSCULAR | Status: AC
  Administered 2016-08-16: 30 mg via INTRAMUSCULAR

## 2016-08-16 NOTE — Patient Instructions (Addendum)
Ibuprofen 800mg  (4 tablets) every 8 hours as needed for pain Flexeril 5mg  (1 tablet) every 6 hours as needed for pain/spasm (will cause drowsiness) You will be contacted to schedule your Ultrasound Follow-up with Gynecology or Dr. Joellyn Quails [phone] [facility]  [name])   Ovarian Cyst  An ovarian cyst is a fluid-filled sac that forms on an ovary. The ovaries are small organs that produce eggs in women. Various types of cysts can form on the ovaries. Some may cause symptoms and require treatment. Most ovarian cysts go away on their own, are not cancerous (are benign), and do not cause problems. Common types of ovarian cysts include:  Functional (follicle) cysts.  Occur during the menstrual cycle, and usually go away with the next menstrual cycle if you do not get pregnant.  Usually cause no symptoms.  Endometriomas.  Are cysts that form from the tissue that lines the uterus (endometrium).  Are sometimes called "chocolate cysts" because they become filled with blood that turns brown.  Can cause pain in the lower abdomen during intercourse and during your period.  Cystadenoma cysts.  Develop from cells on the outside surface of the ovary.  Can get very large and cause lower abdomen pain and pain with intercourse.  Can cause severe pain if they twist or break open (rupture).  Dermoid cysts.  Are sometimes found in both ovaries.  May contain different kinds of body tissue, such as skin, teeth, hair, or cartilage.  Usually do not cause symptoms unless they get very big.  Theca lutein cysts.  Occur when too much of a certain hormone (human chorionic gonadotropin) is produced and overstimulates the ovaries to produce an egg.  Are most common after having procedures used to assist with the conception of a baby (in vitro fertilization). What are the causes? Ovarian cysts may be caused by:  Ovarian hyperstimulation syndrome. This is a condition that  can develop from taking fertility medicines. It causes multiple large ovarian cysts to form.  Polycystic ovarian syndrome (PCOS). This is a common hormonal disorder that can cause ovarian cysts, as well as problems with your period or fertility. What increases the risk? The following factors may make you more likely to develop ovarian cysts:  Being overweight or obese.  Taking fertility medicines.  Taking certain forms of hormonal birth control.  Smoking. What are the signs or symptoms? Many ovarian cysts do not cause symptoms. If symptoms are present, they may include:  Pelvic pain or pressure.  Pain in the lower abdomen.  Pain during sex.  Abdominal swelling.  Abnormal menstrual periods.  Increasing pain with menstrual periods. How is this diagnosed? These cysts are commonly found during a routine pelvic exam. You may have tests to find out more about the cyst, such as:  Ultrasound.  X-ray of the pelvis.  CT scan.  MRI.  Blood tests. How is this treated? Many ovarian cysts go away on their own without treatment. Your health care provider may want to check your cyst regularly for 2-3 months to see if it changes. If you are in menopause, it is especially important to have your cyst monitored closely because menopausal women have a higher rate of ovarian cancer. When treatment is needed, it may include:  Medicines to help relieve pain.  A procedure to drain the cyst (aspiration).  Surgery to remove the whole cyst.  Hormone treatment or birth control pills. These methods are sometimes used to help dissolve a cyst. Follow these instructions at home:  Take  over-the-counter and prescription medicines only as told by your health care provider.  Do not drive or use heavy machinery while taking prescription pain medicine.  Get regular pelvic exams and Pap tests as often as told by your health care provider.  Return to your normal activities as told by your health  care provider. Ask your health care provider what activities are safe for you.  Do not use any products that contain nicotine or tobacco, such as cigarettes and e-cigarettes. If you need help quitting, ask your health care provider.  Keep all follow-up visits as told by your health care provider. This is important. Contact a health care provider if:  Your periods are late, irregular, or painful, or they stop.  You have pelvic pain that does not go away.  You have pressure on your bladder or trouble emptying your bladder completely.  You have pain during sex.  You have any of the following in your abdomen:  A feeling of fullness.  Pressure.  Discomfort.  Pain that does not go away.  Swelling.  You feel generally ill.  You become constipated.  You lose your appetite.  You develop severe acne.  You start to have more body hair and facial hair.  You are gaining weight or losing weight without changing your exercise and eating habits.  You think you may be pregnant. Get help right away if:  You have abdominal pain that is severe or gets worse.  You cannot eat or drink without vomiting.  You suddenly develop a fever.  Your menstrual period is much heavier than usual. This information is not intended to replace advice given to you by your health care provider. Make sure you discuss any questions you have with your health care provider. Document Released: 04/16/2005 Document Revised: 11/04/2015 Document Reviewed: 09/18/2015 Elsevier Interactive Patient Education  2017 [employer].

## 2016-08-16 NOTE — Progress Notes (Signed)
HPI:                                                                Yolanda Mosley is a 36 y.o. female who presents to [place]: [facility] today for left-sided flank/abdominal pain  PMH of stage IIc dysgerminoma diagnosed in 2005 and persistent left ovarian cyst with pelvic pain who presents with:   Abdominal Pain  This is a recurrent problem. The current episode started in the past 7 days (3 days ago). The onset quality is sudden. The problem occurs constantly. The problem has been unchanged. The pain is located in the left flank. The pain is at a severity of 5/10. The quality of the pain is aching and sharp. The abdominal pain radiates to the LLQ. Associated symptoms include diarrhea (loose stools). Pertinent negatives include no arthralgias, dysuria, fever, frequency, hematochezia, hematuria, nausea or vomiting. Associated symptoms comments: Denies urinary or vaginal symptoms. Nothing aggravates the pain. The pain is relieved by nothing. She has tried nothing for the symptoms. Prior diagnostic workup includes ultrasound (10/2013 [place] cyst on left ovary measured 3.3 x 2.6 cm and 3.3 x 2.6 cm). Her past medical history is significant for abdominal surgery and GERD. Right ovarian germinoma (2005)    Past Medical History:  Diagnosis Date  . Allergy   . Anemia   . Asthma   . 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 GERD (gastroesophageal reflux disease)   . Hemorrhoids    laser tx   . Herpes simplex virus (HSV) infection   . Hypertension   . Obesity   . Ovarian cyst    left  . Sleep apnea    Past Surgical History:  Procedure Laterality Date  . OMENTECTOMY  2005  . ovary, bilater fallopian tubes removed  2005  . SALPINGOOPHORECTOMY Right    Social History  Substance Use Topics  . Smoking status: Never Smoker  . Smokeless tobacco: Never Used  . Alcohol use Yes     Comment: occasionally   family history includes Diabetes in her other;  Hypertension in her mother; Other in her other.  ROS: negative except as noted in the HPI  Medications: Current Outpatient Prescriptions  Medication Sig Dispense Refill  . acyclovir (ZOVIRAX) 400 MG tablet TAKE 2 TABLETS BY MOUTH FIVE TIMES DAILY 70 tablet 1  . atenolol (TENORMIN) 50 MG tablet TAKE 1 TABLET (50 MG TOTAL) BY MOUTH DAILY. 90 tablet 1  . benazepril-hydrochlorthiazide (LOTENSIN HCT) 10-12.5 MG tablet Take 1 tablet by mouth daily. 90 tablet 1  . [facility] ALLERGY RELIEF 180 MG tablet TAKE 1 TABLET (180 MG TOTAL) BY MOUTH DAILY. 90 tablet 0  . cyclobenzaprine (FLEXERIL) 5 MG tablet Take 1 tablet (5 mg total) by mouth 3 (three) times daily as needed for muscle spasms. 30 tablet 0  . dexmethylphenidate (FOCALIN) 10 MG tablet Take 1 tablet (10 mg total) by mouth 2 (two) times daily. 180 tablet 0  . omeprazole (PRILOSEC) 40 MG capsule TAKE 1 CAPSULE (40 MG TOTAL) BY MOUTH DAILY. 90 capsule 3  . PROAIR HFA 108 (90 Base) MCG/ACT inhaler INHALE 1 TO 2 PUFFS INTO THE LUNGS EVERY 6 HOURS AS NEEDED FOR WHEEZING 8.5 Inhaler 0  No current facility-administered medications for this visit.    Allergies  Allergen Reactions  . Other Rash  . Hydrocodone Other (See Comments)    Dizzy , nauseous, swimmy headed.(Can take with phenergan)  . Keflex [Cephalexin]     Diarrhea and more abdominal cramping  . Codeine Nausea Only  . Tape Rash       Objective:  BP 126/85   Pulse 79   Wt 253 lb (114.8 kg)   BMI 43.43 kg/m  Gen: well-groomed, cooperative, not ill-appearing, no distress HEENT: normal conjunctiva, no icterus, oropharynx clear, moist mucus membranes Pulm: Normal work of breathing, normal phonation, clear to auscultation bilaterally, no wheezes, rales or rhonchi CV: Normal rate, regular rhythm, s1 and s2 distinct, no murmurs, clicks or rubs  GI: abdomen obese, nondistended, tenderness of LLQ and left flank just inferior to the lower rib, no guarding, no rebound, no CVA tenderness GU:  deferred Neuro: alert and oriented x 3, EOM's intact, normal tone, no tremor MSK: moving all extremities, normal gait and station, no peripheral edema Skin: warm and dry, no rashes or lesions on exposed skin, no jaundice   Results for orders placed or performed in visit on 08/16/16 (from the past 72 hour(s))  POCT Urinalysis Dipstick     Status: Abnormal   Collection Time: 08/16/16  8:52 AM  Result Value Ref Range   Color, UA dark yellow    Clarity, UA clear    Glucose, UA negative    Bilirubin, UA negative    Ketones, UA negative    Spec Grav, UA >=1.030 (A) 1.010 - 1.025   Blood, UA negative    pH, UA 6.0 5.0 - 8.0   Protein, UA negative    Urobilinogen, UA 0.2 0.2 or 1.0 E.U./dL   Nitrite, UA negative    Leukocytes, UA Negative Negative  POCT urine pregnancy     Status: Normal   Collection Time: 08/16/16  8:52 AM  Result Value Ref Range   Preg Test, Ur Negative Negative   No results found.    Assessment and Plan: 36 y.o. female with PMH of stage IIc dysgerminoma diagnosed in 2005 and persistent left ovarian cyst with pelvic pain who presents with:  1. LLQ pain - POCT Urinalysis Dipstick and POCT urine pregnancy negative - High suspicion that this is recurrence of left ovarian cyst. This is a chronic problem for patient and has been demonstrated on prior ultrasound and presented similarly - US Transvaginal Non-OB and US Pelvis Complete will be performed at 10am today - symptomatic management with NSAIDs. Also provided with Flexeril for possible muscle spasm - ketorolac (TORADOL) 30 MG/ML injection 30 mg; Inject 1 mL (30 mg total) into the muscle once. - follow-up with GYN or PCP in 1 week or sooner as needed  2. Cyst of left ovary, History of ovarian cancer Per outside records [facility] on 04/10/2016: 1. Dysgerminoma status post right salpingo-oophorectomy and omentectomy 01/25/04. She had positive pelvic side wall disease that was removed. She received three  cycles of cisplatin, bleomycin, and VP-16. originally had LDH 1598 and alphafetoprotein elevation at 9.7; BhCG was normal. Overall patient is doing well. She has no clinical evidence of recurrent disease. Physical examination is stable labs are reviewed with her. 2. She had a benign tumor of the left ovary, status post removal of the cyst 11/23/05, and fallopian tube, but she still has a remnant of the left ovary in place, and her uterus is still in place.  06-17-13 New left ovarian mass 3.5 x 3.2cm via vaginal ultrasound with Fenton Malling, nurse practitioner, [facility] in [phone]) which needs to be removed. We ordered CT adomen/pelvis, LDH, BHcg, alphafetoprotein, CA125--CT showed left ovary only slightly bigger than 2009 and plan is for f/u with her OB/GYN.   Patient states she does not have a GYN for follow-up. Will place a referral for [facility]  - Ambulatory referral to Obstetrics / Gynecology  3. GERD without esophagitis - cont Prilosec daily - dietary modification  Patient education and anticipatory guidance given Patient agrees with treatment plan Follow-up as needed if symptoms worsen or fail to improve  Darlyne Russian PA-C

## 2016-08-23 ENCOUNTER — Ambulatory Visit (INDEPENDENT_AMBULATORY_CARE_PROVIDER_SITE_OTHER): Payer: 59 | Admitting: Physician Assistant

## 2016-08-23 ENCOUNTER — Ambulatory Visit: Payer: 59 | Admitting: Physician Assistant

## 2016-08-23 VITALS — BP 140/86 | HR 83 | Wt 249.0 lb

## 2016-08-23 DIAGNOSIS — J069 Acute upper respiratory infection, unspecified: Secondary | ICD-10-CM

## 2016-08-23 DIAGNOSIS — J4530 Mild persistent asthma, uncomplicated: Secondary | ICD-10-CM | POA: Diagnosis not present

## 2016-08-23 HISTORY — DX: Mild persistent asthma, uncomplicated: J45.30

## 2016-08-23 LAB — POCT RAPID STREP A (OFFICE): Rapid Strep A Screen: NEGATIVE

## 2016-08-23 MED ORDER — MONTELUKAST SODIUM 10 MG PO TABS: 10.0000 mg | ORAL_TABLET | Freq: Every day | ORAL | 5 refills | Status: DC

## 2016-08-23 MED ORDER — IPRATROPIUM BROMIDE 0.06 % NA SOLN: 1.0000 | Freq: Four times a day (QID) | NASAL | 0 refills | Status: DC | PRN

## 2016-08-23 MED ORDER — ALBUTEROL SULFATE HFA 108 (90 BASE) MCG/ACT IN AERS: 1.0000 | INHALATION_SPRAY | Freq: Four times a day (QID) | RESPIRATORY_TRACT | 2 refills | Status: DC | PRN

## 2016-08-23 MED ORDER — IPRATROPIUM-ALBUTEROL 0.5-2.5 (3) MG/3ML IN SOLN
3.0000 mL | Freq: Once | RESPIRATORY_TRACT | Status: AC
  Administered 2016-08-23: 3 mL via RESPIRATORY_TRACT

## 2016-08-23 NOTE — Progress Notes (Signed)
HPI:                                                                Yolanda Mosley is a 36 y.o. female who presents to [place]: [place] today for sore throat  Sore Throat   This is a new problem. The current episode started yesterday. The problem has been unchanged. Neither side of throat is experiencing more pain than the other. Maximum temperature: tactile. The fever has been present for less than 1 day. The pain is at a severity of 8/10. Associated symptoms include congestion, coughing, ear pain (left), a hoarse voice and swollen glands. Pertinent negatives include no shortness of breath or stridor. She has tried nothing for the symptoms.     Past Medical History:  Diagnosis Date  . Allergy   . Anemia   . Asthma   . 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 GERD (gastroesophageal reflux disease)   . Hemorrhoids    laser tx   . Herpes simplex virus (HSV) infection   . Hypertension   . Obesity   . Ovarian cyst    left  . Sleep apnea    Past Surgical History:  Procedure Laterality Date  . OMENTECTOMY  2005  . ovary, bilater fallopian tubes removed  2005  . SALPINGOOPHORECTOMY Right    Social History  Substance Use Topics  . Smoking status: Never Smoker  . Smokeless tobacco: Never Used  . Alcohol use Yes     Comment: occasionally   family history includes Diabetes in her other; Hypertension in her mother; Other in her other.  ROS: negative except as noted in the HPI  Medications: Current Outpatient Prescriptions  Medication Sig Dispense Refill  . acyclovir (ZOVIRAX) 400 MG tablet TAKE 2 TABLETS BY MOUTH FIVE TIMES DAILY 70 tablet 1  . albuterol (PROAIR HFA) 108 (90 Base) MCG/ACT inhaler Inhale 1-2 puffs into the lungs every 6 (six) hours as needed for wheezing or shortness of breath. 1 Inhaler 2  . atenolol (TENORMIN) 50 MG tablet TAKE 1 TABLET (50 MG TOTAL) BY MOUTH DAILY. 90 tablet 1  . benazepril-hydrochlorthiazide  (LOTENSIN HCT) 10-12.5 MG tablet Take 1 tablet by mouth daily. 90 tablet 1  . [facility] ALLERGY RELIEF 180 MG tablet TAKE 1 TABLET (180 MG TOTAL) BY MOUTH DAILY. 90 tablet 0  . cyclobenzaprine (FLEXERIL) 5 MG tablet Take 1 tablet (5 mg total) by mouth 3 (three) times daily as needed for muscle spasms. 30 tablet 0  . dexmethylphenidate (FOCALIN) 10 MG tablet Take 1 tablet (10 mg total) by mouth 2 (two) times daily. 180 tablet 0  . ipratropium (ATROVENT) 0.06 % nasal spray Place 1 spray into both nostrils 4 (four) times daily as needed. 15 mL 0  . montelukast (SINGULAIR) 10 MG tablet Take 1 tablet (10 mg total) by mouth at bedtime. 30 tablet 5  . omeprazole (PRILOSEC) 40 MG capsule TAKE 1 CAPSULE (40 MG TOTAL) BY MOUTH DAILY. 90 capsule 3   No current facility-administered medications for this visit.    Allergies  Allergen Reactions  . Other Rash  . Hydrocodone Other (See Comments)    Dizzy , nauseous, swimmy headed.(Can take with phenergan)  .  Keflex [Cephalexin]     Diarrhea and more abdominal cramping  . Codeine Nausea Only  . Tape Rash       Objective:  BP 140/86   Pulse 83   Wt 249 lb (112.9 kg)   LMP 08/06/2016 (Approximate)   BMI 42.74 kg/m  Gen: well-groomed, cooperative, not ill-appearing, no distress HEENT: normal conjunctiva, TM's clear, oropharynx clear erythematous, no tonsillar exudates, moist mucus membranes, no frontal or maxillary sinus tenderness Pulm: Normal work of breathing, normal phonation, diffuse expiratory wheezes bilaterally CV: Normal rate, regular rhythm, s1 and s2 distinct, no murmurs, clicks or rubs  Neuro: alert and oriented x 3, EOM's intact Lymph: tender tonsillar adenopathy, no cervical adenopathy Skin: warm and dry, no rashes or lesions on exposed skin, no cyanosis   Results for orders placed or performed in visit on 08/23/16 (from the past 72 hour(s))  POCT rapid strep A     Status: Normal   Collection Time: 08/23/16  4:49 PM  Result Value Ref  Range   Rapid Strep A Screen Negative Negative   No results found.    Assessment and Plan: 36 y.o. female with   1. Mild persistent asthma without complication - Duoneb given in clinic. Wheezes resolved - albuterol (PROAIR HFA) 108 (90 Base) MCG/ACT inhaler; Inhale 1-2 puffs into the lungs every 6 (six) hours as needed for wheezing or shortness of breath.  Dispense: 1 Inhaler; Refill: 2 - montelukast (SINGULAIR) 10 MG tablet; Take 1 tablet (10 mg total) by mouth at bedtime.  Dispense: 30 tablet; Refill: 5  2. Acute upper respiratory infection - POCT rapid strep A negative - symptomatic management w/Tylenol, Chloraseptic and throat lozenges - ipratropium (ATROVENT) 0.06 % nasal spray; Place 1 spray into both nostrils 4 (four) times daily as needed.  Dispense: 15 mL; Refill: 0  Patient education and anticipatory guidance given Patient agrees with treatment plan Follow-up as needed if symptoms worsen or fail to improve  Darlyne Russian PA-C

## 2016-08-23 NOTE — Patient Instructions (Addendum)
- Continue your Zyrtec - Add Singulair nightly for asthma/allergies - Use Albuterol 1-2 puffs every 6 hours as needed for wheezing/shortness of breath - Atrovent nasal spray up to 4 times daily as needed for nasal congestion - Tylenol 500-1000mg  every 8 hours as needed for sore throat - Chloraseptic spray (over-the-counter) and cough drops for sore throat  Upper Respiratory Infection, Adult Most upper respiratory infections (URIs) are a viral infection of the air passages leading to the lungs. A URI affects the nose, throat, and upper air passages. The most common type of URI is nasopharyngitis and is typically referred to as "the common cold." URIs run their course and usually go away on their own. Most of the time, a URI does not require medical attention, but sometimes a bacterial infection in the upper airways can follow a viral infection. This is called a secondary infection. Sinus and middle ear infections are common types of secondary upper respiratory infections. Bacterial pneumonia can also complicate a URI. A URI can worsen asthma and chronic obstructive pulmonary disease (COPD). Sometimes, these complications can require emergency medical care and may be life threatening. What are the causes? Almost all URIs are caused by viruses. A virus is a type of germ and can spread from one person to another. What increases the risk? You may be at risk for a URI if:  You smoke.  You have chronic heart or lung disease.  You have a weakened defense (immune) system.  You are very young or very old.  You have nasal allergies or asthma.  You work in crowded or poorly ventilated areas.  You work in health care facilities or schools. What are the signs or symptoms? Symptoms typically develop 2-3 days after you come in contact with a cold virus. Most viral URIs last 7-10 days. However, viral URIs from the influenza virus (flu virus) can last 14-18 days and are typically more severe. Symptoms  may include:  Runny or stuffy (congested) nose.  Sneezing.  Cough.  Sore throat.  Headache.  Fatigue.  Fever.  Loss of appetite.  Pain in your forehead, behind your eyes, and over your cheekbones (sinus pain).  Muscle aches. How is this diagnosed? Your health care provider may diagnose a URI by:  Physical exam.  Tests to check that your symptoms are not due to another condition such as:  Strep throat.  Sinusitis.  Pneumonia.  Asthma. How is this treated? A URI goes away on its own with time. It cannot be cured with medicines, but medicines may be prescribed or recommended to relieve symptoms. Medicines may help:  Reduce your fever.  Reduce your cough.  Relieve nasal congestion. Follow these instructions at home:  Take medicines only as directed by your health care provider.  Gargle warm saltwater or take cough drops to comfort your throat as directed by your health care provider.  Use a warm mist humidifier or inhale steam from a shower to increase air moisture. This may make it easier to breathe.  Drink enough fluid to keep your urine clear or pale yellow.  Eat soups and other clear broths and maintain good nutrition.  Rest as needed.  Return to work when your temperature has returned to normal or as your health care provider advises. You may need to stay home longer to avoid infecting others. You can also use a face mask and careful hand washing to prevent spread of the virus.  Increase the usage of your inhaler if you have asthma.  Do not use any tobacco products, including cigarettes, chewing tobacco, or electronic cigarettes. If you need help quitting, ask your health care provider. How is this prevented? The best way to protect yourself from getting a cold is to practice good hygiene.  Avoid oral or hand contact with people with cold symptoms.  Wash your hands often if contact occurs. There is no clear evidence that vitamin C, vitamin E,  echinacea, or exercise reduces the chance of developing a cold. However, it is always recommended to get plenty of rest, exercise, and practice good nutrition. Contact a health care provider if:  You are getting worse rather than better.  Your symptoms are not controlled by medicine.  You have chills.  You have worsening shortness of breath.  You have brown or red mucus.  You have yellow or brown nasal discharge.  You have pain in your face, especially when you bend forward.  You have a fever.  You have swollen neck glands.  You have pain while swallowing.  You have white areas in the back of your throat. Get help right away if:  You have severe or persistent:  Headache.  Ear pain.  Sinus pain.  Chest pain.  You have chronic lung disease and any of the following:  Wheezing.  Prolonged cough.  Coughing up blood.  A change in your usual mucus.  You have a stiff neck.  You have changes in your:  Vision.  Hearing.  Thinking.  Mood. This information is not intended to replace advice given to you by your health care provider. Make sure you discuss any questions you have with your health care provider. Document Released: 10/10/2000 Document Revised: 12/18/2015 Document Reviewed: 07/22/2013 Elsevier Interactive Patient Education  2017 [employer].

## 2016-08-27 ENCOUNTER — Telehealth: Payer: Self-pay

## 2016-08-27 DIAGNOSIS — J309 Allergic rhinitis, unspecified: Secondary | ICD-10-CM

## 2016-08-27 NOTE — Telephone Encounter (Signed)
Called after hours nurse, notes are in your box.  She did go to [facility], and has a double ear infection, and has had several breathing tx.  She is also asking about allergy shots, she stated that she took aleergy shots when she was younger and didn't get sick as often.  She is wondering if she should try those again.  Please advise.

## 2016-08-27 NOTE — Telephone Encounter (Signed)
We could try those.  With the referral to [facility] here in [place]. They do the injections there in the testing. If she is okay with that and please go ahead and place the referral.

## 2016-08-27 NOTE — Telephone Encounter (Signed)
Referral placed, notified patient

## 2016-09-04 ENCOUNTER — Other Ambulatory Visit: Payer: Self-pay | Admitting: *Deleted

## 2016-09-04 MED ORDER — DEXMETHYLPHENIDATE HCL 10 MG PO TABS: 10.0000 mg | ORAL_TABLET | Freq: Two times a day (BID) | ORAL | 0 refills | Status: DC

## 2016-09-05 ENCOUNTER — Ambulatory Visit (INDEPENDENT_AMBULATORY_CARE_PROVIDER_SITE_OTHER): Payer: 59 | Admitting: Obstetrics & Gynecology

## 2016-09-05 ENCOUNTER — Encounter: Payer: Self-pay | Admitting: Obstetrics & Gynecology

## 2016-09-05 VITALS — BP 128/87 | HR 78 | Ht 64.0 in | Wt 249.0 lb

## 2016-09-05 DIAGNOSIS — N83202 Unspecified ovarian cyst, left side: Secondary | ICD-10-CM

## 2016-09-05 DIAGNOSIS — B373 Candidiasis of vulva and vagina: Secondary | ICD-10-CM | POA: Diagnosis not present

## 2016-09-05 DIAGNOSIS — B3731 Acute candidiasis of vulva and vagina: Secondary | ICD-10-CM

## 2016-09-05 MED ORDER — NYSTATIN 100000 UNIT/GM EX CREA: 1.0000 "application " | TOPICAL_CREAM | Freq: Two times a day (BID) | CUTANEOUS | 3 refills | Status: DC

## 2016-09-05 MED ORDER — FLUCONAZOLE 150 MG PO TABS: 150.0000 mg | ORAL_TABLET | ORAL | 3 refills | Status: DC

## 2016-09-05 NOTE — Progress Notes (Signed)
GYNECOLOGY OFFICE VISIT NOTE  History:  36 y.o. G0P0000 here today for follow up for left ovarian cysts and associated pain; as diagnosed on ultrasound last month. She has a history of germ cell tumor s/p RSO, followed yearly by oncologist. She denies any current pain.   She also denies any abnormal vaginal discharg or, bleeding but reports severe yeast infection involving vagina and surrounding area due to course of Amoxicillin she is taking for ear infection. Desires antifungal medication.  Past Medical History:  Diagnosis Date  . Allergy   . Anemia   . Asthma   . 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 GERD (gastroesophageal reflux disease)   . Hemorrhoids    laser tx   . Herpes simplex virus (HSV) infection   . Hypertension   . Obesity   . Ovarian cyst    left  . Sleep apnea     Past Surgical History:  Procedure Laterality Date  . OMENTECTOMY  2005  . ovary, bilater fallopian tubes removed  2005  . SALPINGOOPHORECTOMY Right     The following portions of the patient's history were reviewed and updated as appropriate: allergies, current medications, past family history, past medical history, past social history, past surgical history and problem list.   Health Maintenance:  Normal pap several years ago.  Review of Systems:  Pertinent items noted in HPI and remainder of comprehensive ROS otherwise negative.   Objective:  Physical Exam BP 128/87   Pulse 78   Ht 5\' 4"  (1.626 m)   Wt 249 lb (112.9 kg)   LMP 08/12/2016   BMI 42.74 kg/m  CONSTITUTIONAL: Well-developed, well-nourished female in no acute distress.  HENT:  Normocephalic, atraumatic. External right and left ear normal. Oropharynx is clear and moist EYES: Conjunctivae and EOM are normal. Pupils are equal, round, and reactive to light. No scleral icterus.  NECK: Normal range of motion, supple, no masses SKIN: Skin is warm and dry. No rash noted. Not diaphoretic. No erythema. No  pallor. NEUROLOGIC: Alert and oriented to person, place, and time. Normal reflexes, muscle tone coordination. No cranial nerve deficit noted. PSYCHIATRIC: Normal mood and affect. Normal behavior. Normal judgment and thought content. CARDIOVASCULAR: Normal heart rate noted RESPIRATORY: Effort and breath sounds normal, no problems with respiration noted ABDOMEN: Soft, no distention noted, nontender.   PELVIC: Deferred MUSCULOSKELETAL: Normal range of motion. No edema noted.  Labs and Imaging US Transvaginal Non-ob  Result Date: 08/16/2016 CLINICAL DATA:  Left lower quadrant pain. History of ovarian cysts. Right oophorectomy and bilateral salpingectomy. Prior history of germ cell tumor. EXAM: TRANSABDOMINAL AND TRANSVAGINAL ULTRASOUND OF PELVIS TECHNIQUE: Both transabdominal and transvaginal ultrasound examinations of the pelvis were performed. Transabdominal technique was performed for global imaging of the pelvis including uterus, ovaries, adnexal regions, and pelvic cul-de-sac. It was necessary to proceed with endovaginal exam following the transabdominal exam to visualize the uterus and adnexa. COMPARISON:  MRI 01/17/2015.  Ultrasound 01/07/2015 . FINDINGS: Uterus Measurements: 5.7 x 3.5 x 3.8 cm. Retroverted and retroflexed uterus noted. No focal uterine abnormality identified. Endometrium Thickness: 4.6 mm.  No focal abnormality visualized. Right ovary Measurements: Right oophorectomy. Left ovary Measurements: 4.2 x 2.9 x 4.4 cm. Multiple cysts are again noted throughout the ovary most consistent follicular cysts. The largest cyst measuring 2.0 cm in maximum diameter may have faint internal echoes. This could represent a small hemorrhagic cyst. No solid lesions identified. Other findings No abnormal free fluid. IMPRESSION:  1. Multiple cysts are again noted in the left ovary consistent with follicular cysts. The largest cyst measures 2.0 cm in maximum diameter and may have faint internal echoes. This  could represent a small hemorrhagic cyst. Short-interval follow up ultrasound in 6-12 weeks is recommended, preferably during the week following the patient's normal menses. 2.  Right oophorectomy. Electronically Signed   By: Marcello Moores  Register   On: 08/16/2016 13:22   US Pelvis Complete  Result Date: 08/16/2016 CLINICAL DATA:  Left lower quadrant pain. History of ovarian cysts. Right oophorectomy and bilateral salpingectomy. Prior history of germ cell tumor. EXAM: TRANSABDOMINAL AND TRANSVAGINAL ULTRASOUND OF PELVIS TECHNIQUE: Both transabdominal and transvaginal ultrasound examinations of the pelvis were performed. Transabdominal technique was performed for global imaging of the pelvis including uterus, ovaries, adnexal regions, and pelvic cul-de-sac. It was necessary to proceed with endovaginal exam following the transabdominal exam to visualize the uterus and adnexa. COMPARISON:  MRI 01/17/2015.  Ultrasound 01/07/2015 . FINDINGS: Uterus Measurements: 5.7 x 3.5 x 3.8 cm. Retroverted and retroflexed uterus noted. No focal uterine abnormality identified. Endometrium Thickness: 4.6 mm.  No focal abnormality visualized. Right ovary Measurements: Right oophorectomy. Left ovary Measurements: 4.2 x 2.9 x 4.4 cm. Multiple cysts are again noted throughout the ovary most consistent follicular cysts. The largest cyst measuring 2.0 cm in maximum diameter may have faint internal echoes. This could represent a small hemorrhagic cyst. No solid lesions identified. Other findings No abnormal free fluid. IMPRESSION: 1. Multiple cysts are again noted in the left ovary consistent with follicular cysts. The largest cyst measures 2.0 cm in maximum diameter and may have faint internal echoes. This could represent a small hemorrhagic cyst. Short-interval follow up ultrasound in 6-12 weeks is recommended, preferably during the week following the patient's normal menses. 2.  Right oophorectomy. Electronically Signed   By: Marcello Moores   Register   On: 08/16/2016 13:22    Assessment & Plan:  1. Left ovarian cyst Cyst is likely physiologic, patient reassured. No current pain. Will repeat scan as recommended.  Patient was offered checking tumor markers given her history but she declined this; will follow up with oncologist.  - US Transvaginal Non-OB; Future  2. Yeast infection involving the vagina and surrounding area Antifungal medication prescribed as requested. - fluconazole (DIFLUCAN) 150 MG tablet; Take 1 tablet (150 mg total) by mouth every 3 (three) days. For three doses  Dispense: 3 tablet; Refill: 3 - nystatin cream (MYCOSTATIN); Apply 1 application topically 2 (two) times daily.  Dispense: 30 g; Refill: 3  Routine preventative health maintenance measures emphasized. Please refer to After Visit Summary for other counseling recommendations.   Return if symptoms worsen or fail to improve, for Annual exam  (same day as ultrasound).   Total face-to-face time with patient: 20 minutes. Over 50% of encounter was spent on counseling and coordination of care.   Verita Schneiders, MD, [place] Attending [place], [facility] for [employer], [place]

## 2016-09-05 NOTE — Patient Instructions (Signed)
Thank you for enrolling in [facility]. Please follow the instructions below to securely access your online medical record. MyChart allows you to send messages to your doctor, view your test results, manage appointments, and more.   How Do I Sign Up? 1. In your Internet browser, go to [employer] and enter [email or web address]. 2. Click on the Sign Up Now link in the Sign In box. You will see the New Member Sign Up page. 3. Enter your MyChart Access Code exactly as it appears below. You will not need to use this code after you've completed the sign-up process. If you do not sign up before the expiration date, you must request a new code.  MyChart Access Code: [email or web address] Expires: 09/10/2016 11:27 AM  4. Enter your Social Security Number ([id number]) and Date of Birth (mm/dd/yyyy) as indicated and click Submit. You will be taken to the next sign-up page. 5. Create a MyChart ID. This will be your MyChart login ID and cannot be changed, so think of one that is secure and easy to remember. 6. Create a MyChart password. You can change your password at any time. 7. Enter your Password Reset Question and Answer. This can be used at a later time if you forget your password.  8. Enter your e-mail address. You will receive e-mail notification when new information is available in [place]. 9. Click Sign Up. You can now view your medical record.   Additional Information Remember, MyChart is NOT to be used for urgent needs. For medical emergencies, dial 911.   Follow up as scheduled or as needed

## 2016-09-13 ENCOUNTER — Encounter: Payer: Self-pay | Admitting: Family Medicine

## 2016-09-13 ENCOUNTER — Ambulatory Visit (INDEPENDENT_AMBULATORY_CARE_PROVIDER_SITE_OTHER): Payer: 59 | Admitting: Family Medicine

## 2016-09-13 VITALS — BP 128/73 | HR 80 | Ht 64.0 in | Wt 248.0 lb

## 2016-09-13 DIAGNOSIS — H6981 Other specified disorders of Eustachian tube, right ear: Secondary | ICD-10-CM | POA: Diagnosis not present

## 2016-09-13 MED ORDER — CIPROFLOXACIN-HYDROCORTISONE 0.2-1 % OT SUSP: 3.0000 [drp] | Freq: Two times a day (BID) | OTIC | 0 refills | Status: DC

## 2016-09-13 MED ORDER — PREDNISONE 20 MG PO TABS: 40.0000 mg | ORAL_TABLET | Freq: Every day | ORAL | 0 refills | Status: DC

## 2016-09-13 NOTE — Progress Notes (Signed)
Subjective:    Patient ID: Yolanda Mosley, female    DOB: 1980-09-22, 36 y.o.   MRN: [id number]  HPI 36 year old female comes in today particular for right ear pain. Her symptoms initially started with an upper respiratory infection at the end of April. She went to the emergency department on April 28. She then went back to days later on April 30 and was diagnosed with otitis externa of the right ear as well as serous otitis media of the right ear. She was placed on oral Augmentin and has completed that. She says it's better than it was but she still having some right ear pain.   Review of Systems   BP 128/73   Pulse 80   Ht 5\' 4"  (1.626 m)   Wt 248 lb (112.5 kg)   SpO2 99%   BMI 42.57 kg/m     Allergies  Allergen Reactions  . Other Rash  . Hydrocodone Other (See Comments)    Dizzy , nauseous, swimmy headed.(Can take with phenergan)  . Keflex [Cephalexin]     Diarrhea and more abdominal cramping Diarrhea and more abdominal cramping  . Codeine Nausea Only  . Tape Rash    Past Medical History:  Diagnosis Date  . Allergy   . Anemia   . Asthma   . 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 GERD (gastroesophageal reflux disease)   . Hemorrhoids    laser tx   . Herpes simplex virus (HSV) infection   . Hypertension   . Obesity   . Ovarian cyst    left  . Sleep apnea     Past Surgical History:  Procedure Laterality Date  . OMENTECTOMY  2005  . ovary, bilater fallopian tubes removed  2005  . SALPINGOOPHORECTOMY Right     Social History   Social History  . Marital status: Single    Spouse name: N/A  . Number of children: N/A  . Years of education: N/A   Occupational History  . Not on file.   Social History Main Topics  . Smoking status: Never Smoker  . Smokeless tobacco: Never Used  . Alcohol use No  . Drug use: No  . Sexual activity: Yes    Birth control/ protection: None   Other Topics Concern  . Not on file   Social History Narrative  . No  narrative on file    Family History  Problem Relation Age of Onset  . Hypertension Mother   . Diabetes Other   . Other Other        cardiovascular disorder    Outpatient Encounter Prescriptions as of 09/13/2016  Medication Sig  . FLOVENT HFA 110 MCG/ACT inhaler INHALE 2 INHALATIONS TWICE DAILY. USE REGULARLY, RINSE MOUTH AFTER EACH USE.  Marland Kitchen acyclovir (ZOVIRAX) 400 MG tablet TAKE 2 TABLETS BY MOUTH FIVE TIMES DAILY  . albuterol (PROAIR HFA) 108 (90 Base) MCG/ACT inhaler Inhale 1-2 puffs into the lungs every 6 (six) hours as needed for wheezing or shortness of breath.  Marland Kitchen atenolol (TENORMIN) 50 MG tablet TAKE 1 TABLET (50 MG TOTAL) BY MOUTH DAILY.  . benazepril-hydrochlorthiazide (LOTENSIN HCT) 10-12.5 MG tablet Take 1 tablet by mouth daily.  . ciprofloxacin-hydrocortisone (CIPRO HC OTIC) otic suspension Place 3 drops into the right ear 2 (two) times daily. X 5-7 days.  . [facility] ALLERGY RELIEF 180 MG tablet TAKE 1 TABLET (180 MG TOTAL) BY MOUTH DAILY. (Patient not taking: Reported on 09/05/2016)  .  dexmethylphenidate (FOCALIN) 10 MG tablet Take 1 tablet (10 mg total) by mouth 2 (two) times daily. Due for f/u (Patient not taking: Reported on 09/05/2016)  . ipratropium (ATROVENT) 0.06 % nasal spray Place 1 spray into both nostrils 4 (four) times daily as needed. (Patient not taking: Reported on 09/05/2016)  . montelukast (SINGULAIR) 10 MG tablet Take 1 tablet (10 mg total) by mouth at bedtime. (Patient not taking: Reported on 09/05/2016)  . omeprazole (PRILOSEC) 40 MG capsule TAKE 1 CAPSULE (40 MG TOTAL) BY MOUTH DAILY.  Marland Kitchen predniSONE (DELTASONE) 20 MG tablet Take 2 tablets (40 mg total) by mouth daily.  . [DISCONTINUED] cyclobenzaprine (FLEXERIL) 5 MG tablet Take 1 tablet (5 mg total) by mouth 3 (three) times daily as needed for muscle spasms. (Patient not taking: Reported on 09/05/2016)  . [DISCONTINUED] fluconazole (DIFLUCAN) 150 MG tablet Take 1 tablet (150 mg total) by mouth every 3 (three) days. For three  doses  . [DISCONTINUED] nystatin cream (MYCOSTATIN) Apply 1 application topically 2 (two) times daily.   No facility-administered encounter medications on file as of 09/13/2016.           Objective:   Physical Exam  Constitutional: She is oriented to person, place, and time. She appears well-developed and well-nourished.  HENT:  Head: Normocephalic and atraumatic.  Right Ear: External ear normal.  Left Ear: External ear normal.  Nose: Nose normal.  Mouth/Throat: Oropharynx is clear and moist.  TM and canal is clear. Right canal is just slightly erythematous and irritated but actually don't see any excess debris. Tympanic membrane is clear. Unable to see the ossicle well. Good light reflex.  Eyes: Conjunctivae and EOM are normal. Pupils are equal, round, and reactive to light.  Neck: Neck supple. No thyromegaly present.  Cardiovascular: Normal rate, regular rhythm and normal heart sounds.   Pulmonary/Chest: Effort normal and breath sounds normal. She has no wheezes.  Lymphadenopathy:    She has no cervical adenopathy.  Neurological: She is alert and oriented to person, place, and time.  Skin: Skin is warm and dry.  Psychiatric: She has a normal mood and affect.       Assessment & Plan:  Eustachian tube dysfunction-she's getting a lot of popping and pressure will treat with oral prednisone for 5 days. Since she is getting some redness and irritation and some fullness I did go ahead and put her on antibiotic drops though this may be a little bit aggressive treatment without actually seeing a lot of debris. Did encourage her to get back in with urinary some throat. They had tried to contact her from her recent ED visit but she was unable to schedule it at that time. Encouraged her to give [facility] aneurysm. A call.

## 2016-09-17 ENCOUNTER — Encounter: Payer: Self-pay | Admitting: Obstetrics & Gynecology

## 2016-09-17 ENCOUNTER — Ambulatory Visit (INDEPENDENT_AMBULATORY_CARE_PROVIDER_SITE_OTHER): Payer: 59

## 2016-09-17 ENCOUNTER — Ambulatory Visit (INDEPENDENT_AMBULATORY_CARE_PROVIDER_SITE_OTHER): Payer: 59 | Admitting: Obstetrics & Gynecology

## 2016-09-17 VITALS — BP 107/63 | HR 75 | Resp 16 | Ht 64.0 in | Wt 251.0 lb

## 2016-09-17 DIAGNOSIS — Z0000 Encounter for general adult medical examination without abnormal findings: Secondary | ICD-10-CM

## 2016-09-17 DIAGNOSIS — N83202 Unspecified ovarian cyst, left side: Secondary | ICD-10-CM

## 2016-09-17 DIAGNOSIS — Z01419 Encounter for gynecological examination (general) (routine) without abnormal findings: Secondary | ICD-10-CM | POA: Diagnosis not present

## 2016-09-17 DIAGNOSIS — N83292 Other ovarian cyst, left side: Secondary | ICD-10-CM | POA: Diagnosis not present

## 2016-09-17 NOTE — Progress Notes (Signed)
Subjective:    Yolanda Mosley is a 36 y.o. SWG0 female who presents for an annual exam. The patient has no complaints today except for intermittent abdomino pelvic pain that varies in location, sometimes flank, sometimes pelvis. She had an u/s done today but the results are not available as of right now. She "just deals with it", does not use IBU. The patient is sexually active. GYN screening history: last pap: was normal. The patient wears seatbelts: yes. The patient participates in regular exercise: no. Has the patient ever been transfused or tattooed?: no. The patient reports that there is not domestic violence in her life.   Menstrual History: OB History    Gravida Para Term Preterm AB Living   0 0 0 0 0 0   SAB TAB Ectopic Multiple Live Births   0 0 0 0        Menarche age: 51 Patient's last menstrual period was 09/03/2016.    The following portions of the patient's history were reviewed and updated as appropriate: allergies, current medications, past family history, past medical history, past social history, past surgical history and problem list.  Review of Systems Pertinent items are noted in HPI.   FH+ breast cancer in pGM, no gyn or colon cancer Denies dysapreunia, except sometimes positional She works at [employer].   Objective:    BP 107/63   Pulse 75   Resp 16   Ht 5\' 4"  (1.626 m)   Wt 251 lb (113.9 kg)   LMP 09/03/2016   BMI 43.08 kg/m   General Appearance:    Alert, cooperative, no distress, appears stated age  Head:    Normocephalic, without obvious abnormality, atraumatic  Eyes:    PERRL, conjunctiva/corneas clear, EOM's intact, fundi    benign, both eyes  Ears:    Normal TM's and external ear canals, both ears  Nose:   Nares normal, septum midline, mucosa normal, no drainage    or sinus tenderness  Throat:   Lips, mucosa, and tongue normal; teeth and gums normal  Neck:   Supple, symmetrical, trachea midline, no adenopathy;    thyroid:  no  enlargement/tenderness/nodules; no carotid   bruit or JVD  Back:     Symmetric, no curvature, ROM normal, no CVA tenderness  Lungs:     Clear to auscultation bilaterally, respirations unlabored  Chest Wall:    No tenderness or deformity   Heart:    Regular rate and rhythm, S1 and S2 normal, no murmur, rub   or gallop  Breast Exam:    No tenderness, masses, or nipple abnormality  Abdomen:     Soft, non-tender, bowel sounds active all four quadrants,    no masses, no organomegaly  Genitalia:    Normal female without lesion, discharge or tenderness, no palpable masses or tenderness, exam limited by body habitus (u/s results will be available tomorrow)     Extremities:   Extremities normal, atraumatic, no cyanosis or edema  Pulses:   2+ and symmetric all extremities  Skin:   Skin color, texture, turgor normal, no rashes or lesions  Lymph nodes:   Cervical, supraclavicular, and axillary nodes normal  Neurologic:   CNII-XII intact, normal strength, sensation and reflexes    throughout  .    Assessment:    Healthy female exam.    Plan:     Thin prep Pap smear. with cotesting

## 2016-09-18 LAB — CYTOLOGY - PAP
Adequacy: ABSENT
Diagnosis: NEGATIVE
HPV: NOT DETECTED

## 2016-09-19 ENCOUNTER — Other Ambulatory Visit: Payer: Self-pay | Admitting: Family Medicine

## 2016-09-24 ENCOUNTER — Other Ambulatory Visit: Payer: Self-pay | Admitting: Physician Assistant

## 2016-09-24 DIAGNOSIS — J069 Acute upper respiratory infection, unspecified: Secondary | ICD-10-CM

## 2016-09-25 ENCOUNTER — Telehealth: Payer: Self-pay

## 2016-09-25 NOTE — Telephone Encounter (Signed)
Let allergist know OK for beta-blocker. WE could also consider chaning her to a calcium channel blocker instead.

## 2016-09-25 NOTE — Telephone Encounter (Signed)
Pt is going to start allergy shots, but with her being on a beta blocker, if she were to require epi, it would possibly be ineffective because of the beta blocker.  Allergist needs consent from you that it is ok for Yolanda Mosley to have allergy shots even though she is on beta blockers.  Please advise.

## 2016-09-26 ENCOUNTER — Encounter: Payer: Self-pay | Admitting: Family Medicine

## 2016-09-26 ENCOUNTER — Ambulatory Visit (INDEPENDENT_AMBULATORY_CARE_PROVIDER_SITE_OTHER): Payer: 59 | Admitting: Family Medicine

## 2016-09-26 VITALS — BP 119/68 | HR 75 | Ht 64.0 in | Wt 251.0 lb

## 2016-09-26 DIAGNOSIS — I10 Essential (primary) hypertension: Secondary | ICD-10-CM | POA: Diagnosis not present

## 2016-09-26 MED ORDER — AMLODIPINE BESYLATE 5 MG PO TABS: 5.0000 mg | ORAL_TABLET | Freq: Every day | ORAL | 3 refills | Status: DC

## 2016-09-26 NOTE — Progress Notes (Signed)
   Subjective:    Patient ID: Yolanda Mosley, female    DOB: 04-Sep-1980, 36 y.o.   MRN: [id number]  HPI 36 year old female is coming in today to discuss starting allergy shots. Her allergist has recommended this but they're concerned because she's on a beta blocker.  They've actually asked her to sign a release form being aware that there is an increased risk of death in that epinephrine can be cataract by beta blocker in regards to hypotension and shortness of breath. She went to the common and discuss options. She is currently on a beta blocker to help control her blood pressure and for palpitations.   Review of Systems     Objective:   Physical Exam  Constitutional: She is oriented to person, place, and time. She appears well-developed and well-nourished.  HENT:  Head: Normocephalic and atraumatic.  Eyes: Conjunctivae and EOM are normal.  Cardiovascular: Normal rate.   Pulmonary/Chest: Effort normal.  Neurological: She is alert and oriented to person, place, and time.  Skin: Skin is dry. No pallor.  Psychiatric: She has a normal mood and affect. Her behavior is normal.  Vitals reviewed.         Assessment & Plan:  HTN - We discussed several options. I think a calcium channel blocker would be reasonable. It should help with blood pressure control as well as palpitations. We'll start with 2.5 mg of amlodipine and okay to increase to 5 mg daily if needed. I did go ahead and write 5 mg tabs that she can split them in half at least for the first month. Monitor for any side effects or problems. I like her come back in a month for repeat blood pressure check on the medication. They they will likely be checking her blood pressure frequently when she comes in for immunotherapy injections so with elevated she can let me know.

## 2016-09-26 NOTE — Patient Instructions (Signed)
Start amlodipine 1/2 tab once a day . After 5 days if need to go up to 1/2 tab twice a day then can.

## 2016-09-26 NOTE — Telephone Encounter (Signed)
Pt was seen in office today

## 2016-10-01 ENCOUNTER — Ambulatory Visit (INDEPENDENT_AMBULATORY_CARE_PROVIDER_SITE_OTHER): Payer: 59 | Admitting: Osteopathic Medicine

## 2016-10-01 VITALS — BP 135/84 | HR 103 | Ht 64.0 in | Wt 257.0 lb

## 2016-10-01 DIAGNOSIS — R000 Tachycardia, unspecified: Secondary | ICD-10-CM

## 2016-10-01 DIAGNOSIS — L259 Unspecified contact dermatitis, unspecified cause: Secondary | ICD-10-CM | POA: Diagnosis not present

## 2016-10-01 MED ORDER — HYDROXYZINE HCL 25 MG PO TABS: 25.0000 mg | ORAL_TABLET | Freq: Four times a day (QID) | ORAL | 1 refills | Status: DC | PRN

## 2016-10-01 MED ORDER — TRIAMCINOLONE ACETONIDE 0.1 % EX OINT: 1.0000 "application " | TOPICAL_OINTMENT | Freq: Two times a day (BID) | CUTANEOUS | 1 refills | Status: DC

## 2016-10-01 NOTE — Patient Instructions (Signed)
Skin:  Treatment w/ symptom control and avoid possible irritants - topical therapy for itching, antihistamines for severe itching or help with sleeping (the pills may cause drowsiness).   If worsening despite treatment or if any changes, please come back and see [place] for reevaluation. If develops into worse redness or pain, especially if you have associated fatigue/fever, seek care ASAP!

## 2016-10-01 NOTE — Progress Notes (Signed)
HPI: Yolanda Mosley is a 36 y.o. female  who presents to [facility] today, 10/01/16,  for chief complaint of:  Chief Complaint  Patient presents with  . Rash    Rash . Context: No known exposure to irritant or allergen, was at water park over the weekend . Location: ankle on R . Quality: reddish patches, itching  . Duration: 1 day . Timing: concerned might be d/t recent change in "heart medicine"  Occasional heart racing since stopping atenolol, there was some concern for being on this and getting allergy shots so was recently stopped by PCP, no CP/SOB, no dizziness, no exercise intolerance, notices high HR on occasion. No LE edema. Tachycardia is on problem list. Also on stimulants    Past medical history, surgical history, social history and family history reviewed.  Patient Active Problem List   Diagnosis Date Noted  . Mild persistent asthma without complication 63/84/5364  . Gastroesophageal reflux disease with esophagitis 08/16/2016  . Epicondylitis, lateral (tennis elbow) 01/24/2016  . 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 DDD (degenerative disc disease), lumbar 08/11/2014  . Muscle spasm of back 08/10/2014  . Mid back pain 08/10/2014  . Bilateral low back pain without sciatica 08/10/2014  . History of ovarian cancer 01/20/2014  . Gastric ulcer 09/14/2013  . Anemia, iron deficiency 09/09/2013  . Tachycardia 04/15/2012  . Obesity 05/31/2011  . ADD (attention deficit disorder) 03/01/2011  . OSA on CPAP 01/08/2011  . HYPERTENSION, MILD 01/09/2010  . EXTERNAL HEMORRHOIDS 01/09/2010  . ALLERGIC RHINITIS 01/09/2010    Current medication list and allergy/intolerance information reviewed.   Current Outpatient Prescriptions on File Prior to Visit  Medication Sig Dispense Refill  . acyclovir (ZOVIRAX) 400 MG tablet TAKE 2 TABLETS BY MOUTH FIVE TIMES DAILY 70 tablet 1  . albuterol (PROAIR HFA) 108 (90 Base) MCG/ACT inhaler Inhale 1-2 puffs into  the lungs every 6 (six) hours as needed for wheezing or shortness of breath. 1 Inhaler 2  . amLODipine (NORVASC) 5 MG tablet Take 1 tablet (5 mg total) by mouth daily. 30 tablet 3  . benazepril-hydrochlorthiazide (LOTENSIN HCT) 10-12.5 MG tablet Take 1 tablet by mouth daily. 90 tablet 1  . [facility] ALLERGY RELIEF 180 MG tablet TAKE 1 TABLET (180 MG TOTAL) BY MOUTH DAILY. 90 tablet 0  . dexmethylphenidate (FOCALIN) 10 MG tablet Take 1 tablet (10 mg total) by mouth 2 (two) times daily. Due for f/u 180 tablet 0  . FLOVENT HFA 110 MCG/ACT inhaler INHALE 2 INHALATIONS TWICE DAILY. USE REGULARLY, RINSE MOUTH AFTER EACH USE.  5  . ipratropium (ATROVENT) 0.06 % nasal spray Place 1 spray into both nostrils 4 (four) times daily as needed. 16 mL 0  . montelukast (SINGULAIR) 10 MG tablet Take 1 tablet (10 mg total) by mouth at bedtime. 30 tablet 5  . olopatadine (PATANOL) 0.1 % ophthalmic solution Place 1 drop into both eyes daily as needed.  11  . omeprazole (PRILOSEC) 40 MG capsule TAKE 1 CAPSULE (40 MG TOTAL) BY MOUTH DAILY. 90 capsule 3   No current facility-administered medications on file prior to visit.    Allergies  Allergen Reactions  . Other Rash  . Hydrocodone Other (See Comments)    Dizzy , nauseous, swimmy headed.(Can take with phenergan)  . Keflex [Cephalexin]     Diarrhea and more abdominal cramping Diarrhea and more abdominal cramping  . Codeine Nausea Only  . Tape Rash      Review of Systems:  Constitutional: No  recent illness  HEENT: No  headache, no vision change  Cardiac: No  chest pain, No  pressure, No palpitations  Respiratory:  No  shortness of breath. No  Cough  Gastrointestinal: No  abdominal pain, no change on bowel habits  Musculoskeletal: No new myalgia/arthralgia  Skin: +Rash  Neurologic: No  weakness, No  Dizziness   Exam:  BP 135/84   Pulse (!) 103   Ht 5\' 4"  (1.626 m)   Wt 257 lb (116.6 kg)   LMP 09/03/2016   BMI 44.11 kg/m   Constitutional: VS  see above. General Appearance: alert, well-developed, well-nourished, NAD  Eyes: Normal lids and conjunctive, non-icteric sclera  Ears, Nose, Mouth, Throat: MMM, Normal external inspection ears/nares/mouth/lips/gums.  Neck: No masses, trachea midline.   Respiratory: Normal respiratory effort. no wheeze, no rhonchi, no rales  Cardiovascular: S1/S2 normal, no murmur, no rub/gallop auscultated. RRR.   Musculoskeletal: Gait normal. Symmetric and independent movement of all extremities  Neurological: Normal balance/coordination. No tremor.  Skin: warm, dry, intact. Patchy slightly scaling rash on anterior R ankle, no blistering/draining  Psychiatric: Normal judgment/insight. Normal mood and affect. Oriented x3.     ASSESSMENT/PLAN:   Suspect contact dermatitis, reassured patient that this rash not c/w drug rash though of course if this spread/worsens come see [place]  Likely rebound tachycardic symptoms d/t d/c beta blocker, advised follow-up with PCP if not better within few days, no LE edema or SOB which would lead me to pursue PE workup - Wells score indicates low risk. Doesn't look like cellulitis, skin is intact but unknown what exposure she may have had at a water park so advised to watch for signs of infection.   Contact dermatitis, unspecified contact dermatitis type, unspecified trigger - Plan: hydrOXYzine (ATARAX/VISTARIL) 25 MG tablet, triamcinolone ointment (KENALOG) 0.1 %  Tachycardia - likely d/t beta blocker discontinuation w/ stimulant use as well, if persists needs f/u w/ PCP, ER precautions reviewed    Patient Instructions  Skin:  Treatment w/ symptom control and avoid possible irritants - topical therapy for itching, antihistamines for severe itching or help with sleeping (the pills may cause drowsiness).   If worsening despite treatment or if any changes, please come back and see [place] for reevaluation. If develops into worse redness or pain, especially if you have  associated fatigue/fever, seek care ASAP!     Follow-up plan: Return if symptoms worsen or fail to improve.  Visit summary with medication list and pertinent instructions wsa printed for patient to review, alert [place] if any changes needed. All questions at time of visit were answered - patient instructed to contact office with any additional concerns. ER/RTC precautions were reviewed with the patient and understanding verbalized.

## 2016-10-04 ENCOUNTER — Encounter: Payer: Self-pay | Admitting: Family Medicine

## 2016-10-04 ENCOUNTER — Ambulatory Visit (INDEPENDENT_AMBULATORY_CARE_PROVIDER_SITE_OTHER): Payer: 59 | Admitting: Family Medicine

## 2016-10-04 VITALS — BP 127/79 | HR 87 | Wt 252.0 lb

## 2016-10-04 DIAGNOSIS — R000 Tachycardia, unspecified: Secondary | ICD-10-CM

## 2016-10-04 DIAGNOSIS — I10 Essential (primary) hypertension: Secondary | ICD-10-CM

## 2016-10-04 DIAGNOSIS — L259 Unspecified contact dermatitis, unspecified cause: Secondary | ICD-10-CM

## 2016-10-04 NOTE — Progress Notes (Signed)
Subjective:    Patient ID: Yolanda Mosley, female    DOB: March 10, 1981, 36 y.o.   MRN: [id number]  HPI Follow-up hypertension-we recently discontinued her beta blocker so that she could start getting immunotherapy injections as this was contraindicated with the possible use of epinephrine in case she had a reaction. We decided to switch her to amlodipine. So far she's been tolerating it well her blood pressures have not been well controlled. She's been taking her blood pressure at work and the systolics have been running in the upper 130s. Her pulse is been running in the 120 range.  She also wants me to take a look at the rash around her right ankle. She came in a couple days ago. It was felt to be due to either poison ivy or poison oak. She says it really has been more burning instead of itching. She says typically when she's gotten poison ivy in the past it's been very itchy. She was given a topical steroid cream. She does feel like that has helped some but she still has the rash it just looks different than it did 3 days ago. No new lesions. And contained just around the right ankle. She did have a bug bite in that area as well at the time.   Review of Systems  BP 127/79   Pulse 87   Wt 252 lb (114.3 kg)   BMI 43.26 kg/m     Allergies  Allergen Reactions  . Other Rash  . Hydrocodone Other (See Comments)    Dizzy , nauseous, swimmy headed.(Can take with phenergan)  . Keflex [Cephalexin]     Diarrhea and more abdominal cramping Diarrhea and more abdominal cramping  . Codeine Nausea Only  . Tape Rash    Past Medical History:  Diagnosis Date  . Allergy   . Anemia   . Asthma   . 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 GERD (gastroesophageal reflux disease)   . Hemorrhoids    laser tx   . Herpes simplex virus (HSV) infection   . Hypertension   . Obesity   . Ovarian cyst    left  . Sleep apnea     Past Surgical History:  Procedure Laterality Date  . OMENTECTOMY   2005  . ovary, bilater fallopian tubes removed  2005  . SALPINGOOPHORECTOMY Right     Social History   Social History  . Marital status: Single    Spouse name: N/A  . Number of children: N/A  . Years of education: N/A   Occupational History  . Not on file.   Social History Main Topics  . Smoking status: Never Smoker  . Smokeless tobacco: Never Used  . Alcohol use No  . Drug use: No  . Sexual activity: Yes    Birth control/ protection: None   Other Topics Concern  . Not on file   Social History Narrative  . No narrative on file    Family History  Problem Relation Age of Onset  . Hypertension Mother   . Diabetes Other   . Other Other        cardiovascular disorder    Outpatient Encounter Prescriptions as of 10/04/2016  Medication Sig  . acyclovir (ZOVIRAX) 400 MG tablet TAKE 2 TABLETS BY MOUTH FIVE TIMES DAILY  . albuterol (PROAIR HFA) 108 (90 Base) MCG/ACT inhaler Inhale 1-2 puffs into the lungs every 6 (six) hours as needed for wheezing or shortness of  breath.  Marland Kitchen amLODipine (NORVASC) 5 MG tablet Take 1 tablet (5 mg total) by mouth daily.  . benazepril-hydrochlorthiazide (LOTENSIN HCT) 10-12.5 MG tablet Take 1 tablet by mouth daily.  . [facility] ALLERGY RELIEF 180 MG tablet TAKE 1 TABLET (180 MG TOTAL) BY MOUTH DAILY.  Marland Kitchen dexmethylphenidate (FOCALIN) 10 MG tablet Take 1 tablet (10 mg total) by mouth 2 (two) times daily. Due for f/u  . FLOVENT HFA 110 MCG/ACT inhaler INHALE 2 INHALATIONS TWICE DAILY. USE REGULARLY, RINSE MOUTH AFTER EACH USE.  . hydrOXYzine (ATARAX/VISTARIL) 25 MG tablet Take 1 tablet (25 mg total) by mouth every 6 (six) hours as needed for itching.  Marland Kitchen ipratropium (ATROVENT) 0.06 % nasal spray Place 1 spray into both nostrils 4 (four) times daily as needed.  . montelukast (SINGULAIR) 10 MG tablet Take 1 tablet (10 mg total) by mouth at bedtime.  Marland Kitchen olopatadine (PATANOL) 0.1 % ophthalmic solution Place 1 drop into both eyes daily as needed.  Marland Kitchen omeprazole  (PRILOSEC) 40 MG capsule TAKE 1 CAPSULE (40 MG TOTAL) BY MOUTH DAILY.  Marland Kitchen triamcinolone ointment (KENALOG) 0.1 % Apply 1 application topically 2 (two) times daily. To affected area(s) as needed, use for no more than 2 weeks   No facility-administered encounter medications on file as of 10/04/2016.          Objective:   Physical Exam  Constitutional: She is oriented to person, place, and time. She appears well-developed and well-nourished.  HENT:  Head: Normocephalic and atraumatic.  Cardiovascular: Normal rate, regular rhythm and normal heart sounds.   Pulmonary/Chest: Effort normal and breath sounds normal.  Neurological: She is alert and oriented to person, place, and time.  Skin: Skin is warm and dry.  She does have an erythematous rash around the right ankle. Right now it's mostly macular with no scaling. It actually looks like it's starting to fade it's a light pink color.  Psychiatric: She has a normal mood and affect. Her behavior is normal.          Assessment & Plan:  Hypertension-encouraged her to go ahead and increase to a whole tab of the amlodipine. I want her to try that for 3 days and see if her pressure and pulse are better regulated. If she sees some improvement but she's not at goal with a systolic blood pressure less than 130 and a pulse less than 100 and we can increase her amlodipine to 10 mg if needed. We could always consider switching back to the beta blocker if we needed to.  Rash-still suspect some type of contact dermatitis it does look like it's starting to fade compared to the picture that she showed me that she had taken on Monday. Encouraged her to just continue with the cream for now and if it's not significantly better after the weekend and please give [place] a call back.

## 2016-10-05 ENCOUNTER — Telehealth: Payer: Self-pay | Admitting: Family Medicine

## 2016-10-05 NOTE — Telephone Encounter (Signed)
Called pt and informed her to take 1/2 tab of the atenolol and 10 mg of amlodipine. Advised that should she continue to have CP she should go to the ED. Pt voiced understanding and agreed.Audelia Hives [place]

## 2016-10-05 NOTE — Telephone Encounter (Signed)
OK restart half a tab of metoprolol while increasing the almodipine to 10mg  over the weekend.

## 2016-10-05 NOTE — Telephone Encounter (Signed)
Pt was seen in office yesterday for hypertension. She was advised to increase her amlodipine to one whole tab. Today her readings are as follows:  3:00pm - 138/85 (104) 3:39pm- 105/96 (133) 4:15pm - 140/95 (124)  Pt reports her chest feels "tight" and she has a headache. Will route to PCP for review.

## 2016-10-08 NOTE — Telephone Encounter (Signed)
OK, let try to drop the 1/2 tab of atenolol now that she has been on 10mg  of amlodipine for a couple of days.

## 2016-10-08 NOTE — Telephone Encounter (Signed)
Changes was made to pt BP meds on 10/05/16.  She reports that over the weekend her BP dropped to 94/60 with a heart rate of 87 during the day but 115/79 with a heart of 96 at night.  She is currently taking 1/2 tablet of atenolol, 1 tablet of amlodipine, and 1 tablet of lotensin.  Denied dizziness, headache, blurred vision, and nausea.  Will route to PCP for further review. -EH/RMA

## 2016-10-09 NOTE — Telephone Encounter (Signed)
Pt notified -EH/RMA  

## 2016-10-15 ENCOUNTER — Encounter: Payer: Self-pay | Admitting: Physician Assistant

## 2016-10-15 ENCOUNTER — Ambulatory Visit (INDEPENDENT_AMBULATORY_CARE_PROVIDER_SITE_OTHER): Payer: 59 | Admitting: Physician Assistant

## 2016-10-15 VITALS — BP 113/72 | HR 80 | Ht 64.0 in | Wt 253.0 lb

## 2016-10-15 DIAGNOSIS — I10 Essential (primary) hypertension: Secondary | ICD-10-CM

## 2016-10-15 DIAGNOSIS — R000 Tachycardia, unspecified: Secondary | ICD-10-CM

## 2016-10-15 MED ORDER — DILTIAZEM HCL ER COATED BEADS 120 MG PO TB24: 120.0000 mg | ORAL_TABLET | Freq: Every day | ORAL | 0 refills | Status: DC

## 2016-10-15 NOTE — Progress Notes (Signed)
Subjective:    Patient ID: Yolanda Mosley, female    DOB: 1981/02/10, 36 y.o.   MRN: [id number]  HPI  Pt is a 36 yo female who presents to the clinic complaining of elevated BP readings and pulse above 100. She states she just does not feel good when her vitals are out of whack. 1/2 amlodipine is not encough and 1 full tablet helps BP but pulse continues to stay elevated.Today she just didn't take amlodipine and took atenolol with her lotensin. Her vitals are perfect and she has felt great. She continues to get shots of immunotherapy and they continue to increase dose each shot. They do not want her on a beta blocker due to lack of response of epinephrine if allergy were to occur. Patient is at the point where she wants to assume the risk.   .. Active Ambulatory Problems    Diagnosis Date Noted  . HYPERTENSION, MILD 01/09/2010  . EXTERNAL HEMORRHOIDS 01/09/2010  . ALLERGIC RHINITIS 01/09/2010  . OSA on CPAP 01/08/2011  . ADD (attention deficit disorder) 03/01/2011  . Obesity 05/31/2011  . Tachycardia 04/15/2012  . Anemia, iron deficiency 09/09/2013  . Gastric ulcer 09/14/2013  . History of ovarian cancer 01/20/2014  . Muscle spasm of back 08/10/2014  . Mid back pain 08/10/2014  . Bilateral low back pain without sciatica 08/10/2014  . DDD (degenerative disc disease), lumbar 08/11/2014  . 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 Epicondylitis, lateral (tennis elbow) 01/24/2016  . Gastroesophageal reflux disease with esophagitis 08/16/2016  . Mild persistent asthma without complication 40/98/1191   Resolved Ambulatory Problems    Diagnosis Date Noted  . RINGWORM 04/03/2010  . Scabies 04/03/2010  . SINUSITIS - ACUTE-NOS 04/03/2010  . VAGINITIS 01/09/2010  . Otitis externa of right ear 07/21/2012  . Viral syndrome 02/04/2014  . Posterior tibial tendonitis, left 07/12/2016   Past Medical History:  Diagnosis Date  . Allergy   . Anemia   . Asthma   . CA - cancer 2005  . GERD  (gastroesophageal reflux disease)   . Hemorrhoids   . Herpes simplex virus (HSV) infection   . Hypertension   . Obesity   . Ovarian cyst   . Sleep apnea        Review of Systems  All other systems reviewed and are negative.      Objective:   Physical Exam  Constitutional: She is oriented to person, place, and time. She appears well-developed and well-nourished.  Obese.   HENT:  Head: Normocephalic and atraumatic.  Cardiovascular: Normal rate, regular rhythm and normal heart sounds.   Pulmonary/Chest: Effort normal and breath sounds normal.  Neurological: She is alert and oriented to person, place, and time.  Psychiatric: She has a normal mood and affect. Her behavior is normal.          Assessment & Plan:  Marland KitchenMarland KitchenAmber was seen today for hypertension.  Diagnoses and all orders for this visit:  HYPERTENSION, MILD -     diltiazem (CARDIZEM LA) 120 MG 24 hr tablet; Take 1 tablet (120 mg total) by mouth daily.  Tachycardia -     diltiazem (CARDIZEM LA) 120 MG 24 hr tablet; Take 1 tablet (120 mg total) by mouth daily.   Pt feels much better on low dose atenolol and her blood pressure and pulse are perfect!  I do think we should try one more medication at least to see if we can keep HR under 100 and BP down. Stop amlodipine and atenolol. Start  cardizem. Continue lotensin. Keep log at home and report. Follow up in 2 weeks with BP check. If feeling good and vitals look good then will continue regimen. If not we may discuss staying on low dose atenolol after having shots with no reaction in the next few months.

## 2016-10-16 ENCOUNTER — Encounter: Payer: Self-pay | Admitting: Physician Assistant

## 2016-10-17 ENCOUNTER — Other Ambulatory Visit: Payer: Self-pay | Admitting: *Deleted

## 2016-10-17 DIAGNOSIS — J069 Acute upper respiratory infection, unspecified: Secondary | ICD-10-CM

## 2016-10-17 MED ORDER — IPRATROPIUM BROMIDE 0.06 % NA SOLN: 1.0000 | Freq: Four times a day (QID) | NASAL | 3 refills | Status: DC | PRN

## 2016-10-22 ENCOUNTER — Ambulatory Visit (INDEPENDENT_AMBULATORY_CARE_PROVIDER_SITE_OTHER): Payer: 59 | Admitting: Physician Assistant

## 2016-10-22 ENCOUNTER — Encounter: Payer: Self-pay | Admitting: Physician Assistant

## 2016-10-22 VITALS — BP 130/81 | HR 99 | Resp 18 | Wt 249.0 lb

## 2016-10-22 DIAGNOSIS — T50905A Adverse effect of unspecified drugs, medicaments and biological substances, initial encounter: Secondary | ICD-10-CM

## 2016-10-22 DIAGNOSIS — R609 Edema, unspecified: Secondary | ICD-10-CM | POA: Insufficient documentation

## 2016-10-22 DIAGNOSIS — T887XXA Unspecified adverse effect of drug or medicament, initial encounter: Secondary | ICD-10-CM

## 2016-10-22 DIAGNOSIS — R000 Tachycardia, unspecified: Secondary | ICD-10-CM

## 2016-10-22 DIAGNOSIS — I872 Venous insufficiency (chronic) (peripheral): Secondary | ICD-10-CM | POA: Diagnosis not present

## 2016-10-22 DIAGNOSIS — Z87898 Personal history of other specified conditions: Secondary | ICD-10-CM | POA: Diagnosis not present

## 2016-10-22 DIAGNOSIS — R600 Localized edema: Secondary | ICD-10-CM

## 2016-10-22 HISTORY — DX: Personal history of other specified conditions: Z87.898

## 2016-10-22 HISTORY — DX: Edema, unspecified: R60.9

## 2016-10-22 HISTORY — DX: Localized edema: R60.0

## 2016-10-22 MED ORDER — VERAPAMIL HCL ER 100 MG PO CP24: 100.0000 mg | ORAL_CAPSULE | Freq: Every day | ORAL | 3 refills | Status: DC

## 2016-10-22 NOTE — Progress Notes (Signed)
HPI:                                                                Quamesha Mullet is a 36 y.o. female who presents to [facility] [place] today for rash and peripheral edema  Patient with PMH HTN and tachycardia reports bilateral lower extremity swelling beginning yesterday. She has recently undergone changes to her antihypertensive medication and was switched to a calcium channel blocker after discontinuing her beta blocker in order to do allergy injections. Patient was seen in the [facility] ED early this morning and given 1 dose of Lasix. She reports edema improved slightly.  Patient also reports a rash on her lower legs that initially occurred on 10/01/16 and was treated as a contact dermatitis. Patient denies any exposure to poison ivy or plants. It improved, but has recurred Onset: 1 day, recurrent Location: started on distal lower extremities  Duration: constant Character: itching, burning and warm Evolution: red, wrapping around lower leg/ankle Treatments tried: topical corticosteroids, antihistamine  Recent illness / systemic symptoms: none  Medication / drug exposure: new BP med Recent travel: none Animal/insect exposure: none History of allergies: yes, dog, pollen, allergy injection on Thursday Exposure to new soaps, perfumes, cleaning products: none Exposure to chemicals: none   Past Medical History:  Diagnosis Date  . Allergy   . Anemia   . Asthma   . 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 GERD (gastroesophageal reflux disease)   . Hemorrhoids    laser tx   . Herpes simplex virus (HSV) infection   . Hypertension   . Obesity   . Ovarian cyst    left  . Sleep apnea   . Tachycardia    Past Surgical History:  Procedure Laterality Date  . OMENTECTOMY  2005  . ovary, bilater fallopian tubes removed  2005  . SALPINGOOPHORECTOMY Right    Social History  Substance Use Topics  . Smoking status: Never Smoker  . Smokeless  tobacco: Never Used  . Alcohol use No   family history includes Diabetes in her other; Hypertension in her mother; Other in her other.  ROS: negative except as noted in the HPI  Medications: Current Outpatient Prescriptions  Medication Sig Dispense Refill  . acyclovir (ZOVIRAX) 400 MG tablet TAKE 2 TABLETS BY MOUTH FIVE TIMES DAILY 70 tablet 1  . albuterol (PROAIR HFA) 108 (90 Base) MCG/ACT inhaler Inhale 1-2 puffs into the lungs every 6 (six) hours as needed for wheezing or shortness of breath. 1 Inhaler 2  . benazepril-hydrochlorthiazide (LOTENSIN HCT) 10-12.5 MG tablet Take 1 tablet by mouth daily. 90 tablet 1  . [facility] ALLERGY RELIEF 180 MG tablet TAKE 1 TABLET (180 MG TOTAL) BY MOUTH DAILY. 90 tablet 0  . dexmethylphenidate (FOCALIN) 10 MG tablet Take 1 tablet (10 mg total) by mouth 2 (two) times daily. Due for f/u 180 tablet 0  . diltiazem (CARDIZEM LA) 120 MG 24 hr tablet Take 1 tablet (120 mg total) by mouth daily. 30 tablet 0  . FLOVENT HFA 110 MCG/ACT inhaler INHALE 2 INHALATIONS TWICE DAILY. USE REGULARLY, RINSE MOUTH AFTER EACH USE.  5  . hydrOXYzine (ATARAX/VISTARIL) 25 MG tablet Take 1 tablet (25 mg total) by  mouth every 6 (six) hours as needed for itching. 30 tablet 1  . ipratropium (ATROVENT) 0.06 % nasal spray Place 1 spray into both nostrils 4 (four) times daily as needed. 45 mL 3  . montelukast (SINGULAIR) 10 MG tablet Take 1 tablet (10 mg total) by mouth at bedtime. 30 tablet 5  . olopatadine (PATANOL) 0.1 % ophthalmic solution Place 1 drop into both eyes daily as needed.  11  . omeprazole (PRILOSEC) 40 MG capsule TAKE 1 CAPSULE (40 MG TOTAL) BY MOUTH DAILY. 90 capsule 3  . triamcinolone ointment (KENALOG) 0.1 % Apply 1 application topically 2 (two) times daily. To affected area(s) as needed, use for no more than 2 weeks 30 g 1   No current facility-administered medications for this visit.    Allergies  Allergen Reactions  . Other Rash  . Hydrocodone Other (See Comments)     Dizzy , nauseous, swimmy headed.(Can take with phenergan)  . Keflex [Cephalexin]     Diarrhea and more abdominal cramping Diarrhea and more abdominal cramping  . Codeine Nausea Only  . Tape Rash       Objective:  BP 130/81   Pulse 99   Resp 18   Wt 249 lb (112.9 kg)   BMI 42.74 kg/m  Gen: well-groomed, cooperative, obese, not ill-appearing, no distress Pulm: Normal work of breathing, normal phonation CV: Normal rate, regular rhythm, s1 and s2 distinct, no murmurs, clicks or rubs  Neuro: alert and oriented x 3, EOM's intact, no tremor MSK: bilateral symmetric nonpitting lower extremity edema Skin: distal anterior lower extremities with bilateral rash - erythematous, slightly edematous patches, no blistering, ulceration or drainage     No results found for this or any previous visit (from the past 72 hour(s)). No results found.    Assessment and Plan: 36 y.o. female with   1. Adverse effect of drug, initial encounter - peripheral edema likely 2/2 to cardizem - discontinuing cardizem  2. Peripheral edema - reassurance given. Should resolve with discontinuation of medication  3. History of tachycardia - ECHOCARDIOGRAM COMPLETE; Future - reviewed last ECG from 12/2012, which showed NSR with nonspecific T wave inversions  4. Tachycardia - switching to Verapamil for rate control so that patient can continue her allergy injections - verapamil (VERELAN) 100 MG 24 hr capsule; Take 1 capsule (100 mg total) by mouth at bedtime.  Dispense: 30 capsule; Refill: 3  5. Venous stasis dermatitis of both lower extremities - elevate lower extremities, daily walking, weight reduction - continuous compression therapy - no ulcerations or complications   Patient education and anticipatory guidance given Patient agrees with treatment plan Follow-up as needed if symptoms worsen or fail to improve  Darlyne Russian PA-C

## 2016-10-22 NOTE — Assessment & Plan Note (Signed)
Patient was taken off of her atenolol to start allergy shots, apparently there is a contraindication there. She was placed on diltiazem which unfortunately has caused excessive leg swelling, and a rash consistent with venous stasis dermatitis. Because we need to keep her off of beta blocker but still need its chronotropic effects, we can switch to verapamil which has higher nodal depression then diltiazem, and less vascular (but more chronotropic) selectivity. When the allergy shots are done my advice is that we switch her back to a beta blocker, preferably something more selective than atenolol.

## 2016-10-23 DIAGNOSIS — I872 Venous insufficiency (chronic) (peripheral): Secondary | ICD-10-CM

## 2016-10-23 HISTORY — DX: Venous insufficiency (chronic) (peripheral): I87.2

## 2016-10-23 MED ORDER — AMBULATORY NON FORMULARY MEDICATION: 0 refills | Status: DC

## 2016-10-24 ENCOUNTER — Other Ambulatory Visit: Payer: Self-pay | Admitting: Physician Assistant

## 2016-10-24 ENCOUNTER — Telehealth: Payer: Self-pay | Admitting: Family Medicine

## 2016-10-24 ENCOUNTER — Telehealth: Payer: Self-pay

## 2016-10-24 DIAGNOSIS — Z87898 Personal history of other specified conditions: Secondary | ICD-10-CM

## 2016-10-24 NOTE — Telephone Encounter (Signed)
LMOM regarding restarting old rx for atenolol.

## 2016-10-24 NOTE — Telephone Encounter (Signed)
-----   Message from [facility], [place] sent at 10/23/2016  8:14 PM EDT ----- Let patient know faxing a prescription for compression stockings to pharmacy She can alternatively order these online Recommend medium grade compression, knee high, large circumference. Apply daily Remind her to elevate when she is at rest and walk daily

## 2016-10-24 NOTE — Telephone Encounter (Signed)
Call patient's home phone number that is listed. Left message regarding visit yesterday as well as emergency department visit. I think at this point we should switch her back to her beta blocker which she was doing very well on previously even though it may reduce the response of epinephrine if needed while she is doing her allergy shots. Encouraged patient to call us back if she is interested in switching back to her original atenolol.

## 2016-10-24 NOTE — Telephone Encounter (Signed)
Pt called and asked to speak with you

## 2016-10-24 NOTE — Telephone Encounter (Signed)
Pt called and stated she was returning your phone call. I offered for her to speak with triage since you were seeing patients and she stated she would prefer you to give her a call back because she has a couple questions. Thanks

## 2016-10-24 NOTE — Telephone Encounter (Signed)
Left VM with information and call back information.

## 2016-10-29 ENCOUNTER — Encounter: Payer: Self-pay | Admitting: Sports Medicine

## 2016-10-29 ENCOUNTER — Ambulatory Visit (INDEPENDENT_AMBULATORY_CARE_PROVIDER_SITE_OTHER): Payer: 59 | Admitting: Sports Medicine

## 2016-10-29 DIAGNOSIS — R197 Diarrhea, unspecified: Secondary | ICD-10-CM | POA: Diagnosis not present

## 2016-10-29 MED ORDER — ONDANSETRON 8 MG PO TBDP: 8.0000 mg | ORAL_TABLET | Freq: Three times a day (TID) | ORAL | 3 refills | Status: DC | PRN

## 2016-10-29 MED ORDER — DIPHENOXYLATE-ATROPINE 2.5-0.025 MG PO TABS: ORAL_TABLET | ORAL | 3 refills | Status: DC

## 2016-10-29 MED ORDER — PROMETHAZINE HCL 25 MG PO TABS: 25.0000 mg | ORAL_TABLET | Freq: Four times a day (QID) | ORAL | 3 refills | Status: DC | PRN

## 2016-10-29 NOTE — Progress Notes (Signed)
  Subjective:    CC: Diarrhea  HPI: This is a pleasant 36 year old female, for the past day she's had mild watery diarrhea, mild nausea, no hematochezia, hematemesis, no melena. No constitutional symptoms. Overall she feels fine, and understands the diarrhea will resolve on its own but she does need a note for work.  Past medical history:  Negative.  See flowsheet/record as well for more information.  Surgical history: Negative.  See flowsheet/record as well for more information.  Family history: Negative.  See flowsheet/record as well for more information.  Social history: Negative.  See flowsheet/record as well for more information.  Allergies, and medications have been entered into the medical record, reviewed, and no changes needed.   Review of Systems: No fevers, chills, night sweats, weight loss, chest pain, or shortness of breath.   Objective:    General: Well Developed, well nourished, and in no acute distress.  Neuro: Alert and oriented x3, extra-ocular muscles intact, sensation grossly intact.  HEENT: Normocephalic, atraumatic, pupils equal round reactive to light, neck supple, no masses, no lymphadenopathy, thyroid nonpalpable.  Skin: Warm and dry, no rashes. Cardiac: Regular rate and rhythm, no murmurs rubs or gallops, no lower extremity edema.  Respiratory: Clear to auscultation bilaterally. Not using accessory muscles, speaking in full sentences. Abdomen: Soft, nontender, nondistended, normal bowel sounds, palpable masses, guarding or rigidity, rebound pain.  Impression and Recommendations:    Acute diarrhea Lomotil, Phenergan, Zofran. Likely viral enteritis. Work note. Return as needed.

## 2016-10-29 NOTE — Assessment & Plan Note (Signed)
Lomotil, Phenergan, Zofran. Likely viral enteritis. Work note. Return as needed.

## 2016-11-04 ENCOUNTER — Other Ambulatory Visit: Payer: Self-pay | Admitting: Family Medicine

## 2016-11-04 DIAGNOSIS — I10 Essential (primary) hypertension: Secondary | ICD-10-CM

## 2016-11-09 ENCOUNTER — Other Ambulatory Visit: Payer: Self-pay | Admitting: Family Medicine

## 2016-11-09 DIAGNOSIS — I10 Essential (primary) hypertension: Secondary | ICD-10-CM

## 2016-11-13 ENCOUNTER — Other Ambulatory Visit: Payer: Self-pay | Admitting: Physician Assistant

## 2016-11-13 DIAGNOSIS — I10 Essential (primary) hypertension: Secondary | ICD-10-CM

## 2016-11-13 DIAGNOSIS — R000 Tachycardia, unspecified: Secondary | ICD-10-CM

## 2016-11-14 ENCOUNTER — Encounter: Payer: Self-pay | Admitting: Physician Assistant

## 2016-11-14 ENCOUNTER — Ambulatory Visit (HOSPITAL_BASED_OUTPATIENT_CLINIC_OR_DEPARTMENT_OTHER)
Admission: RE | Admit: 2016-11-14 | Discharge: 2016-11-14 | Disposition: A | Discharge status: Home / Self Care | Payer: 59 | Source: Ambulatory Visit | Attending: Physician Assistant | Admitting: Physician Assistant

## 2016-11-14 DIAGNOSIS — Z87898 Personal history of other specified conditions: Secondary | ICD-10-CM

## 2016-11-14 DIAGNOSIS — Z6841 Body Mass Index (BMI) 40.0 and over, adult: Secondary | ICD-10-CM | POA: Diagnosis not present

## 2016-11-14 DIAGNOSIS — R002 Palpitations: Secondary | ICD-10-CM | POA: Insufficient documentation

## 2016-11-14 DIAGNOSIS — I10 Essential (primary) hypertension: Secondary | ICD-10-CM | POA: Insufficient documentation

## 2016-11-14 DIAGNOSIS — E669 Obesity, unspecified: Secondary | ICD-10-CM | POA: Insufficient documentation

## 2016-11-14 DIAGNOSIS — I340 Nonrheumatic mitral (valve) insufficiency: Secondary | ICD-10-CM

## 2016-11-14 HISTORY — DX: Nonrheumatic mitral (valve) insufficiency: I34.0

## 2016-11-14 NOTE — Progress Notes (Signed)
Echo was normal except for mild dysfunction of the mitral valve. This is known as mitral regurgitation. This should be monitored periodically with Echo every 3-5 years to make sure it is not worsening. Otherwise, it does not require any treatment.

## 2016-11-14 NOTE — Progress Notes (Signed)
  Echocardiogram 2D Echocardiogram has been performed.  Donata Clay 11/14/2016, 9:19 AM

## 2016-11-28 ENCOUNTER — Ambulatory Visit: Payer: 59 | Admitting: Physician Assistant

## 2016-11-29 ENCOUNTER — Ambulatory Visit (INDEPENDENT_AMBULATORY_CARE_PROVIDER_SITE_OTHER): Payer: 59 | Admitting: Physician Assistant

## 2016-11-29 ENCOUNTER — Encounter: Payer: Self-pay | Admitting: Physician Assistant

## 2016-11-29 VITALS — BP 118/81 | HR 73 | Temp 97.7°F | Resp 18 | Wt 249.0 lb

## 2016-11-29 DIAGNOSIS — R229 Localized swelling, mass and lump, unspecified: Secondary | ICD-10-CM

## 2016-11-29 NOTE — Progress Notes (Signed)
HPI:                                                                Yolanda Mosley is a 36 y.o. female who presents to [facility] today for "bump on right eyebrow"  Patient reports 3 months ago developing a pimple on her right eyebrow. States she was able to express some purulent fluid and it scabbed over. Since that time, area has grown in size and become firm. It is nontender, nonpruritic, does not bleed. She has not tried any treatments or warm compresses. No personal or family history of skin cancer.  Past Medical History:  Diagnosis Date  . Allergy   . Anemia   . Asthma   . 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 GERD (gastroesophageal reflux disease)   . Hemorrhoids    laser tx   . Herpes simplex virus (HSV) infection   . Hypertension   . Obesity   . Ovarian cyst    left  . Sleep apnea   . Tachycardia    Past Surgical History:  Procedure Laterality Date  . OMENTECTOMY  2005  . ovary, bilater fallopian tubes removed  2005  . SALPINGOOPHORECTOMY Right    Social History  Substance Use Topics  . Smoking status: Never Smoker  . Smokeless tobacco: Never Used  . Alcohol use No   family history includes Diabetes in her other; Hypertension in her mother; Other in her other.  ROS: negative except as noted in the HPI  Medications: Current Outpatient Prescriptions  Medication Sig Dispense Refill  . acyclovir (ZOVIRAX) 400 MG tablet TAKE 2 TABLETS BY MOUTH FIVE TIMES DAILY 70 tablet 1  . albuterol (PROAIR HFA) 108 (90 Base) MCG/ACT inhaler Inhale 1-2 puffs into the lungs every 6 (six) hours as needed for wheezing or shortness of breath. 1 Inhaler 2  . atenolol (TENORMIN) 25 MG tablet Take 25 mg by mouth daily.    . benazepril-hydrochlorthiazide (LOTENSIN HCT) 10-12.5 MG tablet Take 1 tablet by mouth daily. 90 tablet 1  . [facility] ALLERGY RELIEF 180 MG tablet TAKE 1 TABLET (180 MG TOTAL) BY MOUTH DAILY. 90 tablet 0  .  dexmethylphenidate (FOCALIN) 10 MG tablet Take 1 tablet (10 mg total) by mouth 2 (two) times daily. Due for f/u 180 tablet 0  . montelukast (SINGULAIR) 10 MG tablet Take 1 tablet (10 mg total) by mouth at bedtime. 30 tablet 5  . omeprazole (PRILOSEC) 40 MG capsule TAKE 1 CAPSULE (40 MG TOTAL) BY MOUTH DAILY. 90 capsule 3   No current facility-administered medications for this visit.    Allergies  Allergen Reactions  . Other Rash  . Hydrocodone Other (See Comments)    Dizzy , nauseous, swimmy headed.(Can take with phenergan)  . Keflex [Cephalexin]     Diarrhea and more abdominal cramping Diarrhea and more abdominal cramping  . Codeine Nausea Only  . Tape Rash       Objective:  BP 118/81   Pulse 73   Temp 97.7 F (36.5 C) (Oral)   Resp 18   Wt 249 lb (112.9 kg)   SpO2 97%   BMI 42.74 kg/m  Gen: well-groomed, cooperative, not ill-appearing, no distress  HEENT: normal conjunctiva, trachea midline Pulm: Normal work of breathing, normal phonation Neuro: alert and oriented x 3,  no tremor MSK: extremities atraumatic, normal gait and station Skin: warm, dry, intact; firm erythematous nodule on right medial eyebrow approx.31mm x 28mm   No results found for this or any previous visit (from the past 72 hour(s)). No results found.    Assessment and Plan: 36 y.o. female with   1. Erythematous skin nodule - feels cystic in nature. Given facial location and concern for cosmesis will refer to dermatology - Ambulatory referral to Dermatology  Patient education and anticipatory guidance given Patient agrees with treatment plan Follow-up as needed if symptoms worsen or fail to improve  Darlyne Russian PA-C

## 2016-12-01 ENCOUNTER — Other Ambulatory Visit: Payer: Self-pay | Admitting: Physician Assistant

## 2016-12-01 ENCOUNTER — Other Ambulatory Visit: Payer: Self-pay | Admitting: Family Medicine

## 2016-12-01 DIAGNOSIS — I10 Essential (primary) hypertension: Secondary | ICD-10-CM

## 2016-12-01 DIAGNOSIS — J4530 Mild persistent asthma, uncomplicated: Secondary | ICD-10-CM

## 2016-12-16 ENCOUNTER — Other Ambulatory Visit: Payer: Self-pay | Admitting: Physician Assistant

## 2016-12-16 DIAGNOSIS — J4530 Mild persistent asthma, uncomplicated: Secondary | ICD-10-CM

## 2016-12-25 ENCOUNTER — Other Ambulatory Visit: Payer: Self-pay | Admitting: *Deleted

## 2016-12-25 DIAGNOSIS — I10 Essential (primary) hypertension: Secondary | ICD-10-CM

## 2016-12-25 MED ORDER — ATENOLOL 50 MG PO TABS: 50.0000 mg | ORAL_TABLET | Freq: Every day | ORAL | 1 refills | Status: DC

## 2017-01-21 ENCOUNTER — Other Ambulatory Visit: Payer: Self-pay | Admitting: Family Medicine

## 2017-03-05 ENCOUNTER — Other Ambulatory Visit: Payer: Self-pay | Admitting: *Deleted

## 2017-03-05 DIAGNOSIS — I10 Essential (primary) hypertension: Secondary | ICD-10-CM

## 2017-03-05 MED ORDER — BENAZEPRIL-HYDROCHLOROTHIAZIDE 10-12.5 MG PO TABS: 1.0000 | ORAL_TABLET | Freq: Every day | ORAL | 1 refills | Status: DC

## 2017-03-11 ENCOUNTER — Other Ambulatory Visit: Payer: Self-pay | Admitting: Family Medicine

## 2017-06-10 ENCOUNTER — Telehealth: Payer: Self-pay | Admitting: Family Medicine

## 2017-06-10 MED ORDER — DEXMETHYLPHENIDATE HCL 10 MG PO TABS: 10.0000 mg | ORAL_TABLET | Freq: Two times a day (BID) | ORAL | 0 refills | Status: DC

## 2017-06-10 NOTE — Telephone Encounter (Signed)
Pt called clinic requesting refill on her Focalin. Advised she is due for an appointment. Requesting PCP send one month supply and she will schedule in that timeframe. Will route. Using [facility] [address] in [place].

## 2017-06-10 NOTE — Telephone Encounter (Signed)
OK, 1 month Rx sent.

## 2017-06-11 NOTE — Telephone Encounter (Signed)
Pt advised.

## 2017-09-09 ENCOUNTER — Telehealth: Payer: Self-pay | Admitting: Family Medicine

## 2017-09-09 NOTE — Telephone Encounter (Signed)
Pt has not had this medication since February and was only given a 30 day supply. She can get this refilled when she comes in for her appt on May 22nd. Please call her back and inform her of this. Elouise Munroe, [place]

## 2017-09-09 NOTE — Telephone Encounter (Signed)
Pt returned call and msg was given.  She's okay with waiting until appointment scheduled for May 22nd for refill on ADD meds.

## 2017-09-09 NOTE — Telephone Encounter (Signed)
Left msg for pt to call me back

## 2017-09-09 NOTE — Telephone Encounter (Signed)
Pt called needing a refill on ADD meds, she only has one pill left. She has scheduled an appointment to see you on May 22nd.

## 2017-09-18 ENCOUNTER — Ambulatory Visit (INDEPENDENT_AMBULATORY_CARE_PROVIDER_SITE_OTHER): Payer: Self-pay | Admitting: Family Medicine

## 2017-09-18 ENCOUNTER — Encounter: Payer: Self-pay | Admitting: Family Medicine

## 2017-09-18 VITALS — BP 127/74 | HR 64 | Ht 64.0 in | Wt 254.0 lb

## 2017-09-18 DIAGNOSIS — I10 Essential (primary) hypertension: Secondary | ICD-10-CM

## 2017-09-18 DIAGNOSIS — F900 Attention-deficit hyperactivity disorder, predominantly inattentive type: Secondary | ICD-10-CM

## 2017-09-18 DIAGNOSIS — K143 Hypertrophy of tongue papillae: Secondary | ICD-10-CM

## 2017-09-18 MED ORDER — ACYCLOVIR 400 MG PO TABS: ORAL_TABLET | ORAL | 99 refills | Status: DC

## 2017-09-18 MED ORDER — ATENOLOL 50 MG PO TABS: 50.0000 mg | ORAL_TABLET | Freq: Every day | ORAL | 1 refills | Status: DC

## 2017-09-18 MED ORDER — DEXMETHYLPHENIDATE HCL 10 MG PO TABS: 10.0000 mg | ORAL_TABLET | Freq: Two times a day (BID) | ORAL | 0 refills | Status: DC

## 2017-09-18 MED ORDER — BENAZEPRIL-HYDROCHLOROTHIAZIDE 10-12.5 MG PO TABS: 1.0000 | ORAL_TABLET | Freq: Every day | ORAL | 1 refills | Status: DC

## 2017-09-18 NOTE — Progress Notes (Signed)
   Subjective:    Patient ID: Yolanda Mosley, female    DOB: 1980-06-16, 37 y.o.   MRN: [id number]  HPI ADD - Reports symptoms are well controlled on current regime. Denies any problems with insomnia, chest pain, palpitations, or SOB.    Hypertension- Pt denies chest pain, SOB, dizziness, or heart palpitations.  Taking meds as directed w/o problems.  Denies medication side effects.     She was also seen at the ED for Acute left knee pain and they ruled out DVT.    Noticed a thick coating on her tongue on Monday. Not painful or sore.  No recent aBX.    Review of Systems     Objective:   Physical Exam  Constitutional: She is oriented to person, place, and time. She appears well-developed and well-nourished.  HENT:  Head: Normocephalic and atraumatic.  Right Ear: External ear normal.  Left Ear: External ear normal.  Nose: Nose normal.  Mouth/Throat: Oropharynx is clear and moist.  Tongue with thick white coating with some sparing on the right side of the tongue. No erythema. TMs and canals are clear bilaterally  Cardiovascular: Normal rate, regular rhythm and normal heart sounds.  Pulmonary/Chest: Effort normal and breath sounds normal.  Neurological: She is alert and oriented to person, place, and time.  Skin: Skin is warm and dry.  Psychiatric: She has a normal mood and affect. Her behavior is normal.       Assessment & Plan:  ADD - Well controlled. Continue current regimen. Follow up in  6 months.   HTN - Well controlled. Continue current regimen. Follow up in  6 months.    Thick  coating on tongue - use a tongue scraper and if not better call back and will tx with nystatin Swish and swallow.

## 2017-12-10 ENCOUNTER — Other Ambulatory Visit: Payer: Self-pay | Admitting: Family Medicine

## 2017-12-10 DIAGNOSIS — I10 Essential (primary) hypertension: Secondary | ICD-10-CM

## 2018-03-10 ENCOUNTER — Emergency Department (INDEPENDENT_AMBULATORY_CARE_PROVIDER_SITE_OTHER)
Admission: EM | Admit: 2018-03-10 | Discharge: 2018-03-10 | Disposition: A | Discharge status: Home / Self Care | Payer: BLUE CROSS/BLUE SHIELD | Source: Home / Self Care | Attending: Family Medicine | Admitting: Family Medicine

## 2018-03-10 ENCOUNTER — Other Ambulatory Visit: Payer: Self-pay

## 2018-03-10 DIAGNOSIS — J069 Acute upper respiratory infection, unspecified: Secondary | ICD-10-CM

## 2018-03-10 MED ORDER — AZITHROMYCIN 250 MG PO TABS: 250.0000 mg | ORAL_TABLET | Freq: Every day | ORAL | 0 refills | Status: DC

## 2018-03-10 NOTE — ED Provider Notes (Signed)
[facility]    CSN: [id number] Arrival date & time: 03/10/18  1302     History   Chief Complaint Chief Complaint  Patient presents with  . Cough    HPI Karcyn Menn is a 37 y.o. female.   HPI Yilin Weedon is a 37 y.o. female presenting to UC with c/o cold-like symptoms for about 1 week. She went to the ED last week and tested negative for the flu. Her body aches have resolved but she is now coughing up brown phlegm.  Pt also c/o chest soreness from coughing.  She has used her albuterol inhaler with mild temporary relief.    Past Medical History:  Diagnosis Date  . Allergy   . Anemia   . Asthma   . 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 GERD (gastroesophageal reflux disease)   . Hemorrhoids    laser tx   . Herpes simplex virus (HSV) infection   . Hypertension   . Obesity   . Ovarian cyst    left  . Sleep apnea   . Tachycardia     Patient Active Problem List   Diagnosis Date Noted  . Erythematous skin nodule 11/29/2016  . Mild mitral regurgitation by prior echocardiogram 11/14/2016  . Venous stasis dermatitis of both lower extremities 10/23/2016  . History of tachycardia 10/22/2016  . Peripheral edema 10/22/2016  . Mild persistent asthma without complication 34/74/2595  . Gastroesophageal reflux disease with esophagitis 08/16/2016  . Epicondylitis, lateral (tennis elbow) 01/24/2016  . 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 DDD (degenerative disc disease), lumbar 08/11/2014  . Muscle spasm of back 08/10/2014  . Mid back pain 08/10/2014  . Bilateral low back pain without sciatica 08/10/2014  . History of ovarian cancer 01/20/2014  . Gastric ulcer 09/14/2013  . Anemia, iron deficiency 09/09/2013  . Tachycardia 04/15/2012  . Obesity 05/31/2011  . ADD (attention deficit disorder) 03/01/2011  . OSA on CPAP 01/08/2011  . HYPERTENSION, MILD 01/09/2010  . EXTERNAL HEMORRHOIDS 01/09/2010  . ALLERGIC RHINITIS 01/09/2010    Past Surgical  History:  Procedure Laterality Date  . OMENTECTOMY  2005  . ovary, bilater fallopian tubes removed  2005  . SALPINGOOPHORECTOMY Right     OB History    Gravida  0   Para  0   Term  0   Preterm  0   AB  0   Living  0     SAB  0   TAB  0   Ectopic  0   Multiple  0   Live Births               Home Medications    Prior to Admission medications   Medication Sig Start Date End Date Taking? Authorizing Provider  acyclovir (ZOVIRAX) 400 MG tablet TAKE 2 TABLETS BY MOUTH FIVE TIMES DAILY 09/18/17   Hali Marry, MD  atenolol (TENORMIN) 50 MG tablet Take 1 tablet (50 mg total) by mouth daily. 09/18/17   Hali Marry, MD  azithromycin (ZITHROMAX) 250 MG tablet Take 1 tablet (250 mg total) by mouth daily. Take first 2 tablets together, then 1 every day until finished. 03/10/18   Noe Gens, PA-C  benazepril-hydrochlorthiazide (LOTENSIN HCT) 10-12.5 MG tablet TAKE 1 TABLET BY MOUTH ONCE DAILY 12/10/17   Hali Marry, MD  [facility] ALLERGY RELIEF 180 MG tablet TAKE 1 TABLET (180 MG TOTAL) BY MOUTH DAILY. 09/12/15   Hali Marry, MD  dexmethylphenidate (FOCALIN) 10 MG tablet Take 1 tablet (10 mg total) by mouth 2 (two) times daily. Due for f/u 09/18/17   Hali Marry, MD  dexmethylphenidate (FOCALIN) 10 MG tablet Take 1 tablet (10 mg total) by mouth 2 (two) times daily. 10/19/17   Hali Marry, MD  dexmethylphenidate (FOCALIN) 10 MG tablet Take 1 tablet (10 mg total) by mouth 2 (two) times daily. 11/18/17   Hali Marry, MD  PROAIR HFA 108 [phone] Base) MCG/ACT inhaler INHALE 1-2 PUFFS INTO THE LUNGS EVERY 6 (SIX) HOURS AS NEEDED FOR WHEEZING OR SHORTNESS OF BREATH. 12/03/16   Hali Marry, MD    Family History Family History  Problem Relation Age of Onset  . Hypertension Mother   . Diabetes Other   . Other Other        cardiovascular disorder    Social History Social History   Tobacco Use  . Smoking status:  Never Smoker  . Smokeless tobacco: Never Used  Substance Use Topics  . Alcohol use: No  . Drug use: No     Allergies   Other; Hydrocodone; Keflex [cephalexin]; Codeine; and Tape   Review of Systems Review of Systems  Constitutional: Negative for chills and fever.  HENT: Positive for congestion and postnasal drip. Negative for ear pain, sore throat, trouble swallowing and voice change.   Respiratory: Positive for cough and chest tightness. Negative for shortness of breath.   Cardiovascular: Negative for chest pain and palpitations.  Gastrointestinal: Negative for abdominal pain, diarrhea, nausea and vomiting.  Musculoskeletal: Negative for arthralgias, back pain and myalgias.  Skin: Negative for rash.     Physical Exam Triage Vital Signs ED Triage Vitals  Enc Vitals Group     BP 03/10/18 1328 118/81     Pulse Rate 03/10/18 1328 63     Resp --      Temp 03/10/18 1328 (!) 96.9 F (36.1 C)     Temp Source 03/10/18 1328 Tympanic     SpO2 03/10/18 1328 97 %     Weight 03/10/18 1329 252 lb (114.3 kg)     Height 03/10/18 1329 5\' 2"  (1.575 m)     Head Circumference --      Peak Flow --      Pain Score 03/10/18 1329 5     Pain Loc --      Pain Edu? --      Excl. in [place]? --    No data found.  Updated Vital Signs BP 118/81 (BP Location: Right Arm)   Pulse 63   Temp (!) 96.9 F (36.1 C) (Tympanic)   Ht 5\' 2"  (1.575 m)   Wt 252 lb (114.3 kg)   SpO2 97%   BMI 46.09 kg/m   Visual Acuity Right Eye Distance:   Left Eye Distance:   Bilateral Distance:    Right Eye Near:   Left Eye Near:    Bilateral Near:     Physical Exam  Constitutional: She is oriented to person, place, and time. She appears well-developed and well-nourished. No distress.  HENT:  Head: Normocephalic and atraumatic.  Right Ear: Tympanic membrane normal.  Left Ear: Tympanic membrane normal.  Nose: Nose normal.  Mouth/Throat: Uvula is midline, oropharynx is clear and moist and mucous membranes are  normal.  Eyes: EOM are normal.  Neck: Normal range of motion. Neck supple.  Cardiovascular: Normal rate and regular rhythm.  Pulmonary/Chest: Effort normal. No stridor. No respiratory distress. She has wheezes. She has  rhonchi. She has no rales.  Faint diffuse wheeze and rhonchi  Musculoskeletal: Normal range of motion.  Neurological: She is alert and oriented to person, place, and time.  Skin: Skin is warm and dry. She is not diaphoretic.  Psychiatric: She has a normal mood and affect. Her behavior is normal.  Nursing note and vitals reviewed.    UC Treatments / Results  Labs (all labs ordered are listed, but only abnormal results are displayed) Labs Reviewed - No data to display  EKG None  Radiology No results found.  Procedures Procedures (including critical care time)  Medications Ordered in UC Medications - No data to display  Initial Impression / Assessment and Plan / UC Course  I have reviewed the triage vital signs and the nursing notes.  Pertinent labs & imaging results that were available during my care of the patient were reviewed by me and considered in my medical decision making (see chart for details).     Will tx for atypical bacteria with azithromycin Home care instructions provided.  Final Clinical Impressions(s) / UC Diagnoses   Final diagnoses:  Upper respiratory tract infection, unspecified type     Discharge Instructions      Please follow up with family medicine in 1 week if not improving.   Please take antibiotics as prescribed and be sure to complete entire course even if you start to feel better to ensure infection does not come back.     ED Prescriptions    Medication Sig Dispense Auth. Provider   azithromycin (ZITHROMAX) 250 MG tablet Take 1 tablet (250 mg total) by mouth daily. Take first 2 tablets together, then 1 every day until finished. 6 tablet Noe Gens, PA-C     Controlled Substance Prescriptions [place] Controlled  Substance Registry consulted? Not Applicable   Tyrell Antonio 03/10/18 1807

## 2018-03-10 NOTE — Discharge Instructions (Signed)
°  Please follow up with family medicine in 1 week if not improving.   Please take antibiotics as prescribed and be sure to complete entire course even if you start to feel better to ensure infection does not come back.

## 2018-03-10 NOTE — ED Triage Notes (Signed)
Last Monday pt started with cold sx.  Pt went to the ER, and was checked for the flu, it was negative.  Most of the sx have resolved except for the cough.  Productive, brown phlegm, and chest pain from cough.

## 2018-03-20 ENCOUNTER — Encounter: Payer: Self-pay | Admitting: Family Medicine

## 2018-03-20 ENCOUNTER — Ambulatory Visit (INDEPENDENT_AMBULATORY_CARE_PROVIDER_SITE_OTHER): Payer: BLUE CROSS/BLUE SHIELD | Admitting: Family Medicine

## 2018-03-20 ENCOUNTER — Ambulatory Visit (INDEPENDENT_AMBULATORY_CARE_PROVIDER_SITE_OTHER): Payer: BLUE CROSS/BLUE SHIELD

## 2018-03-20 VITALS — BP 139/82 | HR 91 | Ht 64.17 in | Wt 251.0 lb

## 2018-03-20 DIAGNOSIS — R0602 Shortness of breath: Secondary | ICD-10-CM | POA: Diagnosis not present

## 2018-03-20 DIAGNOSIS — R05 Cough: Secondary | ICD-10-CM | POA: Diagnosis not present

## 2018-03-20 DIAGNOSIS — J209 Acute bronchitis, unspecified: Secondary | ICD-10-CM

## 2018-03-20 DIAGNOSIS — F900 Attention-deficit hyperactivity disorder, predominantly inattentive type: Secondary | ICD-10-CM | POA: Diagnosis not present

## 2018-03-20 DIAGNOSIS — R0789 Other chest pain: Secondary | ICD-10-CM | POA: Diagnosis not present

## 2018-03-20 DIAGNOSIS — I10 Essential (primary) hypertension: Secondary | ICD-10-CM

## 2018-03-20 DIAGNOSIS — L723 Sebaceous cyst: Secondary | ICD-10-CM

## 2018-03-20 MED ORDER — ALBUTEROL SULFATE 108 (90 BASE) MCG/ACT IN AEPB: 2.0000 | INHALATION_SPRAY | Freq: Three times a day (TID) | RESPIRATORY_TRACT | 0 refills | Status: DC | PRN

## 2018-03-20 MED ORDER — PREDNISONE 20 MG PO TABS: 40.0000 mg | ORAL_TABLET | Freq: Every day | ORAL | 0 refills | Status: DC

## 2018-03-20 MED ORDER — BENZONATATE 200 MG PO CAPS: 200.0000 mg | ORAL_CAPSULE | Freq: Two times a day (BID) | ORAL | 0 refills | Status: DC | PRN

## 2018-03-20 NOTE — Progress Notes (Addendum)
Subjective:    CC:   HPI:  Hypertension- Pt denies chest pain, SOB, dizziness, or heart palpitations.  Taking meds as directed w/o problems.  Denies medication side effects.    ADD - Reports symptoms are well controlled on current regime. Denies any problems with insomnia, chest pain, palpitations, or SOB.    Complains of URI 2-1/2 weeks she has had runny nose and cough.  She went to the urgent care and was seen was given a Z-Pak she did complete that about a week ago.  Since then she has continued to cough is been mostly productive.  But today she is actually had some midsternal chest pain with no radiation.  It has been persistent and she also feels short of breath like when she is talking she is running out of air.  She has not had that before.  Has a cyst along her right eyebrow ridge that is been there for little over a year.  Like to have it removed.  Past medical history, Surgical history, Family history not pertinant except as noted below, Social history, Allergies, and medications have been entered into the medical record, reviewed, and corrections made.   Review of Systems: No fevers, chills, night sweats, weight loss, chest pain, or shortness of breath.   Objective:    General: Well Developed, well nourished, and in no acute distress.  Neuro: Alert and oriented x3, extra-ocular muscles intact, sensation grossly intact.  HEENT: Normocephalic, atraumatic  Skin: Warm and dry, no rashes. Cardiac: Regular rate and rhythm, no murmurs rubs or gallops, no lower extremity edema.  Respiratory: Clear to auscultation bilaterally. Not using accessory muscles, speaking in full sentences.   Impression and Recommendations:    HTN - Bp is borderline.  Monitor carefully.  ADD - Well controlled. Continue current regimen. Follow up in  6 months.  Did encourage her to hold her medication while she is sick and so we figure out what is going on.  Atypical chest pain -EKG today shows rate of 81  bpm, normal sinus rhythm.  She does meet criteria for left ventricular hypertrophy.  Otherwise there are no acute ST-T wave changes.  Acute bronchitis-chest x-ray was actually negative today ruling out pneumonia which is very reassuring.  I would like to put her on some prednisone.  She would benefit from some Tessalon Perles to help control cough.  If she is not improving after the weekend and she can give [place] call back.  Sebaceous cyst-we will refer you refer to plastic surgery for removal.

## 2018-03-21 NOTE — Addendum Note (Signed)
Addended by: Beatrice Lecher D on: 03/21/2018 12:02 PM   Modules accepted: Orders

## 2018-04-08 ENCOUNTER — Encounter: Payer: Self-pay | Admitting: Physician Assistant

## 2018-04-08 ENCOUNTER — Ambulatory Visit (INDEPENDENT_AMBULATORY_CARE_PROVIDER_SITE_OTHER): Payer: BLUE CROSS/BLUE SHIELD | Admitting: Physician Assistant

## 2018-04-08 VITALS — BP 113/78 | HR 73 | Temp 97.7°F | Wt 255.0 lb

## 2018-04-08 DIAGNOSIS — J019 Acute sinusitis, unspecified: Secondary | ICD-10-CM

## 2018-04-08 DIAGNOSIS — J45901 Unspecified asthma with (acute) exacerbation: Secondary | ICD-10-CM

## 2018-04-08 DIAGNOSIS — J22 Unspecified acute lower respiratory infection: Secondary | ICD-10-CM

## 2018-04-08 DIAGNOSIS — B9689 Other specified bacterial agents as the cause of diseases classified elsewhere: Secondary | ICD-10-CM | POA: Diagnosis not present

## 2018-04-08 MED ORDER — DOXYCYCLINE HYCLATE 100 MG PO TABS: 100.0000 mg | ORAL_TABLET | Freq: Two times a day (BID) | ORAL | 0 refills | Status: DC

## 2018-04-08 MED ORDER — BUDESONIDE-FORMOTEROL FUMARATE 80-4.5 MCG/ACT IN AERO: 2.0000 | INHALATION_SPRAY | Freq: Two times a day (BID) | RESPIRATORY_TRACT | 0 refills | Status: DC

## 2018-04-08 NOTE — Progress Notes (Signed)
HPI:                                                                Yolanda Mosley is a 37 y.o. female who presents to [facility] today for congestion and cough  Cough  This is a new problem. The current episode started 1 to 4 weeks ago (approx 1 month ago). The problem has been waxing and waning. The problem occurs hourly. The cough is productive of purulent sputum. Associated symptoms include chest pain ("tightness"), nasal congestion, postnasal drip and shortness of breath. Pertinent negatives include no chills, ear pain, fever, hemoptysis, myalgias, rash or wheezing. She has tried oral steroids and OTC cough suppressant (Azithromycin) for the symptoms. The treatment provided mild relief. Her past medical history is significant for asthma and pneumonia.     Past Medical History:  Diagnosis Date  . Allergy   . Anemia   . Asthma   . 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 GERD (gastroesophageal reflux disease)   . Hemorrhoids    laser tx   . Herpes simplex virus (HSV) infection   . Hypertension   . Obesity   . Ovarian cyst    left  . Sleep apnea   . Tachycardia    Past Surgical History:  Procedure Laterality Date  . OMENTECTOMY  2005  . ovary, bilater fallopian tubes removed  2005  . SALPINGOOPHORECTOMY Right    Social History   Tobacco Use  . Smoking status: Never Smoker  . Smokeless tobacco: Never Used  Substance Use Topics  . Alcohol use: No   family history includes Diabetes in her other; Hypertension in her mother; Other in her other.    ROS: negative except as noted in the HPI  Medications: Current Outpatient Medications  Medication Sig Dispense Refill  . acyclovir (ZOVIRAX) 400 MG tablet TAKE 2 TABLETS BY MOUTH FIVE TIMES DAILY 70 tablet PRN  . Albuterol Sulfate (PROAIR RESPICLICK) 025 (90 Base) MCG/ACT AEPB Inhale 2 puffs into the lungs every 8 (eight) hours as needed. 1 each 0  . atenolol  (TENORMIN) 50 MG tablet Take 1 tablet (50 mg total) by mouth daily. 90 tablet 1  . benazepril-hydrochlorthiazide (LOTENSIN HCT) 10-12.5 MG tablet TAKE 1 TABLET BY MOUTH ONCE DAILY 90 tablet 1  . [facility] ALLERGY RELIEF 180 MG tablet TAKE 1 TABLET (180 MG TOTAL) BY MOUTH DAILY. 90 tablet 0  . dexmethylphenidate (FOCALIN) 10 MG tablet Take 1 tablet (10 mg total) by mouth 2 (two) times daily. 60 tablet 0   No current facility-administered medications for this visit.    Allergies  Allergen Reactions  . Other Rash  . Hydrocodone Other (See Comments)    Dizzy , nauseous, swimmy headed.(Can take with phenergan)  . Keflex [Cephalexin]     Diarrhea and more abdominal cramping Diarrhea and more abdominal cramping  . Codeine Nausea Only  . Tape Rash       Objective:  BP 113/78   Pulse 73   Temp 97.7 F (36.5 C) (Oral)   Wt 255 lb (115.7 kg)   SpO2 95%   BMI 43.53 kg/m  Gen:  alert, not ill-appearing, no distress, appropriate for age 63: head  normocephalic without obvious abnormality, conjunctiva and cornea clear, tympanic membranes pearly gray and semitransparent bilaterally, nasal mucosa edematous, no frontal or maxillary sinus tenderness, oropharynx clear, tonsils grade 2+, no exudates, uvula midline, neck supple, no cervical adenopathy trachea midline Pulm: Normal work of breathing, normal phonation, end expiratory wheezes of bilateral upper lung fields CV: Normal rate, regular rhythm, s1 and s2 distinct, no murmurs, clicks or rubs  Neuro: alert and oriented x 3, no tremor MSK: extremities atraumatic, normal gait and station Skin: intact, slightly red nodule of right eyebrow  Study Result   CLINICAL DATA:  C/o cough and SOB x 3 weeks with new chest pain today. Hx HTN.  EXAM: CHEST - 2 VIEW  COMPARISON:  07/27/2015  FINDINGS: The heart size and mediastinal contours are within normal limits. Both lungs are clear. The visualized skeletal structures  are unremarkable.  IMPRESSION: No active cardiopulmonary disease.   Electronically Signed   By: Nolon Nations M.D.   On: 03/20/2018 15:24     No results found for this or any previous visit (from the past 72 hour(s)). No results found.    Assessment and Plan: 37 y.o. female with   .[name] was seen today for sinusitis.  Diagnoses and all orders for this visit:  Acute bacterial sinusitis -     doxycycline (VIBRA-TABS) 100 MG tablet; Take 1 tablet (100 mg total) by mouth 2 (two) times daily.  Acute lower respiratory infection -     doxycycline (VIBRA-TABS) 100 MG tablet; Take 1 tablet (100 mg total) by mouth 2 (two) times daily.  Asthma exacerbation, mild -     budesonide-formoterol (SYMBICORT) 80-4.5 MCG/ACT inhaler; Inhale 2 puffs into the lungs 2 (two) times daily for 10 days.   Afebrile, no tachypnea, no tachycardia, pulse ox 95% on RA at rest, relative hypoxia, expiratory wheezes of upper lung fields bilaterally Persistent nasal congestion and productive cough for approx 1 month Has failed single round of antibiotics (Azithromycin) and prednisone burst  CXR 03/20/18 personally reviewed by me and negative Treating for bacterial sinusitis and bronchitis with Doxycycline for 10 days Symbicort 2 puffs twice daily for 7 to 10 days for mild asthma exacerbation    Patient education and anticipatory guidance given Patient agrees with treatment plan Follow-up as needed if symptoms worsen or fail to improve  Darlyne Russian PA-C

## 2018-04-28 ENCOUNTER — Telehealth: Payer: Self-pay

## 2018-04-28 NOTE — Telephone Encounter (Signed)
Yolanda Mosley called and states she wants to switch back to the Vyvanse now that she has insurance. She states it works much better than [employer]. I advised she may need an appointment. Please advise.

## 2018-05-01 MED ORDER — LISDEXAMFETAMINE DIMESYLATE 70 MG PO CAPS: 70.0000 mg | ORAL_CAPSULE | ORAL | 0 refills | Status: DC

## 2018-05-01 NOTE — Telephone Encounter (Signed)
New rx sent for Vyvanse.

## 2018-05-01 NOTE — Telephone Encounter (Signed)
Patient advised.

## 2018-05-06 ENCOUNTER — Telehealth: Payer: Self-pay | Admitting: Family Medicine

## 2018-05-06 NOTE — Telephone Encounter (Signed)
Pt called to advise Vyvanse requires PA. Routing for initiation

## 2018-05-07 NOTE — Telephone Encounter (Signed)
Information has been sent to insurance and waiting on response.

## 2018-05-13 NOTE — Telephone Encounter (Signed)
Received fax from [employer] that Vyvanse was approved from  Pharmacy notified and forms sent to scan.

## 2018-06-05 ENCOUNTER — Ambulatory Visit (INDEPENDENT_AMBULATORY_CARE_PROVIDER_SITE_OTHER): Payer: BLUE CROSS/BLUE SHIELD | Admitting: Family Medicine

## 2018-06-05 ENCOUNTER — Encounter: Payer: Self-pay | Admitting: Family Medicine

## 2018-06-05 VITALS — BP 122/72 | HR 69 | Ht 64.0 in | Wt 249.0 lb

## 2018-06-05 DIAGNOSIS — R51 Headache: Secondary | ICD-10-CM

## 2018-06-05 DIAGNOSIS — F900 Attention-deficit hyperactivity disorder, predominantly inattentive type: Secondary | ICD-10-CM

## 2018-06-05 DIAGNOSIS — I10 Essential (primary) hypertension: Secondary | ICD-10-CM

## 2018-06-05 DIAGNOSIS — R519 Headache, unspecified: Secondary | ICD-10-CM

## 2018-06-05 MED ORDER — LISDEXAMFETAMINE DIMESYLATE 70 MG PO CAPS: 70.0000 mg | ORAL_CAPSULE | Freq: Every day | ORAL | 0 refills | Status: DC

## 2018-06-05 MED ORDER — LISDEXAMFETAMINE DIMESYLATE 70 MG PO CAPS: 70.0000 mg | ORAL_CAPSULE | ORAL | 0 refills | Status: DC

## 2018-06-05 MED ORDER — PREDNISONE 10 MG PO TABS: ORAL_TABLET | ORAL | 0 refills | Status: AC

## 2018-06-05 NOTE — Progress Notes (Signed)
Subjective:    CC: HTN, HA, and ADD   HPI:  Hypertension- Pt denies chest pain, SOB, dizziness, or heart palpitations.  Taking meds as directed w/o problems.  Denies medication side effects.    Headaches x 4 days, she has been taking excedrin this has not helped, also c/o some nausea and dizziness when she goes from sitting to standing, and bending over. denies any facial pain however, she did say that her ears have been bothering her. No sig nasal congestion. Had a URI several weeks ago.    ADD - Reports symptoms are well controlled on current regime. Denies any problems with insomnia, chest pain, palpitations, or SOB.     Past medical history, Surgical history, Family history not pertinant except as noted below, Social history, Allergies, and medications have been entered into the medical record, reviewed, and corrections made.   Review of Systems: No fevers, chills, night sweats, weight loss, chest pain, or shortness of breath.   Objective:    General: Well Developed, well nourished, and in no acute distress.  Neuro: Alert and oriented x3, extra-ocular muscles intact, sensation grossly intact.  HEENT: Normocephalic, atraumatic,, oropharynx clear, TMs and canals are clear bilaterally.  No significant cervical lymphadenopathy. Skin: Warm and dry, no rashes. Cardiac: Regular rate and rhythm, no murmurs rubs or gallops, no lower extremity edema.  Respiratory: Clear to auscultation bilaterally. Not using accessory muscles, speaking in full sentences.   Impression and Recommendations:    HTN - Well controlled. Continue current regimen. Follow up in  6 months.  Overdue for blood work so encouraged her to go in the next week or 2 if possible.  ADD-happy with current regimen.  We will going to send refills for her Vyvanse.  Headaches, persistant daily x 5 days with no relief. Has had a persistent daily headache for the last 5 days.  We will try a prednisone taper. Better after that and  please let us know or if at any point she feels like she is starting to experience more nasal discharge or congestion consistent with a sinusitis then please let us know.

## 2018-06-10 ENCOUNTER — Ambulatory Visit (INDEPENDENT_AMBULATORY_CARE_PROVIDER_SITE_OTHER): Payer: BLUE CROSS/BLUE SHIELD | Admitting: Family Medicine

## 2018-06-10 ENCOUNTER — Encounter: Payer: Self-pay | Admitting: Family Medicine

## 2018-06-10 VITALS — BP 122/77 | HR 79 | Ht 64.0 in | Wt 251.0 lb

## 2018-06-10 DIAGNOSIS — R0789 Other chest pain: Secondary | ICD-10-CM

## 2018-06-10 DIAGNOSIS — R1032 Left lower quadrant pain: Secondary | ICD-10-CM | POA: Diagnosis not present

## 2018-06-10 DIAGNOSIS — N83202 Unspecified ovarian cyst, left side: Secondary | ICD-10-CM | POA: Diagnosis not present

## 2018-06-10 MED ORDER — OMEPRAZOLE 40 MG PO CPDR: 40.0000 mg | DELAYED_RELEASE_CAPSULE | Freq: Every day | ORAL | 3 refills | Status: DC

## 2018-06-10 NOTE — Progress Notes (Signed)
Established Patient Office Visit  Subjective:  Patient ID: Yolanda Mosley, female    DOB: 1981-04-04  Age: 38 y.o. MRN: [id number]  CC:  Chief Complaint  Patient presents with  . Follow-up    chest pain, L sided abd pain    HPI Yolanda Mosley presents for LLQ pain and f/U ED. at actually seen her earlier in the day on February 6 for just a routine follow-up appointment.  She is actually had some chest discomfort the day before but had gone away she thought maybe it was reflux related.  But later that evening it started back again around 6 PM she was at work and they checked her blood pressure and pulse a couple different times and it was elevated and her pulse was running in the 120s to 130s so she ended up going to the emergency department.  They did a work-up including EKG, chest x-ray, labs and because her d-dimer was elevated they ended up doing a CT angios to rule out a pulmonary embolism which was also negative.  They gave her some pain medication and sent her home.  She still has some discomfort in the mid chest area is not quite as severe as it was and her blood pressure and pulse seem to have returned to normal though she says it actually took a couple days to normalize.  She even skipped taking her Vyvanse over the weekend just to see if that could be causing her chest pain and it did not seem to make a difference.  Also since last Thursday she has had some left-sided abdominal pain.  She says it started more in the left lower quadrant in the pelvic area and then has gradually been going up into the left upper quadrant just underneath her ribs.  She says it feels very similar to when she had problems with her right ovary before she had it removed.  She does have a history of a complex cyst on her left ovary.  Last ultrasound was in 2018 a little over a year and a half ago.  She says when she urinates she will feel little bit more pain in the abdomen but no actual burning with urination and no  vaginal pain.  They did do a urinalysis in the emergency department and it was negative.  She says her bowels are moving normally she is not noticed any blood in the urine or stool.  Past Medical History:  Diagnosis Date  . Allergy   . Anemia   . Asthma   . 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 GERD (gastroesophageal reflux disease)   . Hemorrhoids    laser tx   . Herpes simplex virus (HSV) infection   . Hypertension   . Obesity   . Ovarian cyst    left  . Sleep apnea   . Tachycardia     Past Surgical History:  Procedure Laterality Date  . OMENTECTOMY  2005  . ovary, bilater fallopian tubes removed  2005  . SALPINGOOPHORECTOMY Right     Family History  Problem Relation Age of Onset  . Hypertension Mother   . Diabetes Other   . Other Other        cardiovascular disorder    Social History   Socioeconomic History  . Marital status: Single    Spouse name: Not on file  . Number of children: Not on file  . Years of education: Not on file  .  Highest education level: Not on file  Occupational History  . Not on file  Social Needs  . Financial resource strain: Not on file  . Food insecurity:    Worry: Not on file    Inability: Not on file  . Transportation needs:    Medical: Not on file    Non-medical: Not on file  Tobacco Use  . Smoking status: Never Smoker  . Smokeless tobacco: Never Used  Substance and Sexual Activity  . Alcohol use: No  . Drug use: No  . Sexual activity: Yes    Birth control/protection: None  Lifestyle  . Physical activity:    Days per week: Not on file    Minutes per session: Not on file  . Stress: Not on file  Relationships  . Social connections:    Talks on phone: Not on file    Gets together: Not on file    Attends religious service: Not on file    Active member of club or organization: Not on file    Attends meetings of clubs or organizations: Not on file    Relationship status: Not on file  . Intimate partner  violence:    Fear of current or ex partner: Not on file    Emotionally abused: Not on file    Physically abused: Not on file    Forced sexual activity: Not on file  Other Topics Concern  . Not on file  Social History Narrative  . Not on file    Outpatient Medications Prior to Visit  Medication Sig Dispense Refill  . Albuterol Sulfate (PROAIR RESPICLICK) 202 (90 Base) MCG/ACT AEPB Inhale 2 puffs into the lungs every 8 (eight) hours as needed. 1 each 0  . atenolol (TENORMIN) 50 MG tablet Take 1 tablet (50 mg total) by mouth daily. 90 tablet 1  . benazepril-hydrochlorthiazide (LOTENSIN HCT) 10-12.5 MG tablet TAKE 1 TABLET BY MOUTH ONCE DAILY 90 tablet 1  . [facility]S ALLERGY RELIEF 180 MG tablet TAKE 1 TABLET (180 MG TOTAL) BY MOUTH DAILY. 90 tablet 0  . [START ON 07/04/2018] lisdexamfetamine (VYVANSE) 70 MG capsule Take 1 capsule (70 mg total) by mouth every morning. 30 capsule 0  . lisdexamfetamine (VYVANSE) 70 MG capsule Take 1 capsule (70 mg total) by mouth daily. 30 capsule 0  . [START ON 08/04/2018] lisdexamfetamine (VYVANSE) 70 MG capsule Take 1 capsule (70 mg total) by mouth daily. 30 capsule 0  . predniSONE (DELTASONE) 10 MG tablet Take 8 tablets (80 mg total) by mouth daily with breakfast for 1 day, THEN 6 tablets (60 mg total) daily with breakfast for 1 day, THEN 4 tablets (40 mg total) daily with breakfast for 1 day, THEN 2 tablets (20 mg total) daily with breakfast for 1 day, THEN 1 tablet (10 mg total) daily with breakfast for 1 day. 21 tablet 0  . predniSONE (DELTASONE) 10 MG tablet      No facility-administered medications prior to visit.     Allergies  Allergen Reactions  . Other Rash  . Hydrocodone Other (See Comments)    Dizzy , nauseous, swimmy headed.(Can take with phenergan)  . Keflex [Cephalexin]     Diarrhea and more abdominal cramping Diarrhea and more abdominal cramping  . Codeine Nausea Only  . Tape Rash    ROS Review of Systems    Objective:    Physical Exam   Constitutional: She is oriented to person, place, and time. She appears well-developed and well-nourished.  HENT:  Head: Normocephalic  and atraumatic.  Right Ear: External ear normal.  Left Ear: External ear normal.  Eyes: Conjunctivae are normal.  Cardiovascular: Normal rate, regular rhythm and normal heart sounds.  Pulmonary/Chest: Effort normal and breath sounds normal.  Abdominal: Soft. Bowel sounds are normal. She exhibits no distension and no mass. There is abdominal tenderness. There is no rebound and no guarding.  Tenderness in the left lower quadrant and left mid abdomen.  No guarding or rebound.  Neurological: She is alert and oriented to person, place, and time.  Skin: Skin is warm and dry.  Psychiatric: She has a normal mood and affect. Her behavior is normal.    BP 122/77   Pulse 79   Ht 5\' 4"  (1.626 m)   Wt 251 lb (113.9 kg)   SpO2 99%   BMI 43.08 kg/m  Wt Readings from Last 3 Encounters:  06/10/18 251 lb (113.9 kg)  06/05/18 249 lb (112.9 kg)  04/08/18 255 lb (115.7 kg)     There are no preventive care reminders to display for this patient.  There are no preventive care reminders to display for this patient.  Lab Results  Component Value Date   TSH 3.645 01/21/2014   Lab Results  Component Value Date   WBC 6.4 01/11/2015   HGB 12.1 01/11/2015   HCT 36.2 01/11/2015   MCV 80.8 01/11/2015   PLT 238 01/11/2015   Lab Results  Component Value Date   NA 136 11/04/2015   K 3.8 11/04/2015   CO2 25 11/04/2015   GLUCOSE 89 11/04/2015   BUN 21 11/04/2015   CREATININE 0.70 11/04/2015   BILITOT 0.4 11/04/2015   ALKPHOS 109 11/04/2015   AST 20 11/04/2015   ALT 22 11/04/2015   PROT 6.5 11/04/2015   ALBUMIN 4.0 11/04/2015   CALCIUM 9.1 11/04/2015   Lab Results  Component Value Date   CHOL 156 11/04/2015   Lab Results  Component Value Date   HDL 54 11/04/2015   Lab Results  Component Value Date   LDLCALC 83 11/04/2015   Lab Results  Component  Value Date   TRIG 95 11/04/2015   Lab Results  Component Value Date   CHOLHDL 2.9 11/04/2015   No results found for: HGBA1C    Assessment & Plan:   Problem List Items Addressed This Visit    None    Visit Diagnoses    Left lower quadrant pain    -  Primary   Relevant Orders   US Pelvic Complete With Transvaginal   Left ovarian cyst       Relevant Orders   US Pelvic Complete With Transvaginal   Atypical chest pain         Atypical chest pain-discussed that at least the work-up in the emergency department rule some things out including her heart and lungs.  So that pretty much has to be either from the GI track or possibly musculoskeletal.  For now I want her to try PPI for the next several days and see if this actually provide some relief for her.  Guards to the left lower quadrant pain with her history of a complex ovarian cyst let us move forward with pelvic ultrasound for further work-up especially since her pain originated in the pelvis even though now it is radiating up into the abdomen.  If it is negative we cannot find a specific cause for her discomfort and if not improving in the next couple days then will consider further work-up with CT  of the abdomen.  Meds ordered this encounter  Medications  . omeprazole (PRILOSEC) 40 MG capsule    Sig: Take 1 capsule (40 mg total) by mouth daily.    Dispense:  30 capsule    Refill:  3    Follow-up: Return if symptoms worsen or fail to improve.    Beatrice Lecher, MD

## 2018-06-11 ENCOUNTER — Ambulatory Visit (INDEPENDENT_AMBULATORY_CARE_PROVIDER_SITE_OTHER): Payer: BLUE CROSS/BLUE SHIELD

## 2018-06-11 ENCOUNTER — Encounter: Payer: Self-pay | Admitting: Family Medicine

## 2018-06-11 DIAGNOSIS — N83202 Unspecified ovarian cyst, left side: Secondary | ICD-10-CM

## 2018-06-11 DIAGNOSIS — R1032 Left lower quadrant pain: Secondary | ICD-10-CM

## 2018-06-11 LAB — LIPID PANEL
Cholesterol: 147 mg/dL (ref <200)
HDL: 50 mg/dL (ref >=50)
LDL Cholesterol (Calc): 79 mg/dL (calc)
NON-HDL CHOLESTEROL (CALC): 97 mg/dL (ref <130)
Total CHOL/HDL Ratio: 2.9 (calc) (ref <5.0)
Triglycerides: 92 mg/dL (ref <150)

## 2018-06-11 LAB — COMPLETE METABOLIC PANEL WITH GFR
AG RATIO: 1.4 (calc) (ref 1.0–2.5)
ALBUMIN MSPROF: 3.8 g/dL (ref 3.6–5.1)
ALT: 19 U/L (ref 6–29)
AST: 18 U/L (ref 10–30)
Alkaline phosphatase (APISO): 121 U/L (ref 31–125)
BILIRUBIN TOTAL: 0.4 mg/dL (ref 0.2–1.2)
BUN: 12 mg/dL (ref 7–25)
CALCIUM: 9.4 mg/dL (ref 8.6–10.2)
CHLORIDE: 102 mmol/L (ref 98–110)
CO2: 26 mmol/L (ref 20–32)
Creat: 0.71 mg/dL (ref 0.50–1.10)
GFR, EST AFRICAN AMERICAN: 127 mL/min/{1.73_m2} (ref >=60)
GFR, EST NON AFRICAN AMERICAN: 110 mL/min/{1.73_m2} (ref >=60)
GLOBULIN: 2.8 g/dL (ref 1.9–3.7)
Glucose, Bld: 96 mg/dL (ref 65–99)
POTASSIUM: 3.7 mmol/L (ref 3.5–5.3)
SODIUM: 138 mmol/L (ref 135–146)
TOTAL PROTEIN: 6.6 g/dL (ref 6.1–8.1)

## 2018-06-11 NOTE — Progress Notes (Signed)
All labs are normal. 

## 2018-06-12 NOTE — Telephone Encounter (Signed)
Left message advising of normal labs.

## 2018-06-16 ENCOUNTER — Telehealth: Payer: Self-pay | Admitting: Family Medicine

## 2018-06-16 NOTE — Telephone Encounter (Signed)
These call patient and let her know that it did show a 3 and half centimeter left ovarian cyst which is most consistent with a hemorrhagic cyst meaning that the cyst can sometimes rupture and bleed a little bit but is not worrisome for cancer.  Is also a 2.3 cm simple cyst.  Recommendation will be to repeat ultrasound in 1 month to see if the cyst is about the same but they both appear to be benign but certainly this could have been the cause of her pain.  Usually it gets better otherwise the uterus does look normal.  IMPRESSION: 3.5 cm left ovarian cyst has an appearance consistent with a  hemorrhagic cyst. 2.3 cm simple left ovarian cyst also noted.  Normal-appearing uterus.  Status post right oophorectomy.

## 2018-06-16 NOTE — Telephone Encounter (Signed)
Dr Jerilynn Mages: Pt called. She is following up on her imaging results.  Thanks.

## 2018-06-16 NOTE — Telephone Encounter (Signed)
Pt advised of results, verbalized understanding. No further questions.  

## 2018-06-17 NOTE — Telephone Encounter (Signed)
Thank you :)

## 2018-06-19 ENCOUNTER — Other Ambulatory Visit: Payer: Self-pay | Admitting: Family Medicine

## 2018-06-19 DIAGNOSIS — I10 Essential (primary) hypertension: Secondary | ICD-10-CM

## 2018-07-21 ENCOUNTER — Telehealth: Payer: Self-pay | Admitting: *Deleted

## 2018-07-21 ENCOUNTER — Ambulatory Visit: Payer: BLUE CROSS/BLUE SHIELD | Admitting: Family Medicine

## 2018-07-21 NOTE — Telephone Encounter (Signed)
Asked pt to rtn call about doing a telephone visit to f/u on ADHD medications. Asked that if she does want to do this to please check her bp,wt,and have all of her current medications at the time of the call. She will need to call and speak w/scheduling to inform them of this.[name], [name], CMA

## 2018-07-22 ENCOUNTER — Telehealth (INDEPENDENT_AMBULATORY_CARE_PROVIDER_SITE_OTHER): Payer: BLUE CROSS/BLUE SHIELD | Admitting: Family Medicine

## 2018-07-22 ENCOUNTER — Other Ambulatory Visit: Payer: Self-pay

## 2018-07-22 VITALS — BP 125/78 | HR 83 | Temp 97.0°F | Wt 251.0 lb

## 2018-07-22 DIAGNOSIS — I10 Essential (primary) hypertension: Secondary | ICD-10-CM | POA: Diagnosis not present

## 2018-07-22 DIAGNOSIS — F900 Attention-deficit hyperactivity disorder, predominantly inattentive type: Secondary | ICD-10-CM

## 2018-07-22 MED ORDER — LISDEXAMFETAMINE DIMESYLATE 70 MG PO CAPS: 70.0000 mg | ORAL_CAPSULE | ORAL | 0 refills | Status: DC

## 2018-07-22 MED ORDER — LISDEXAMFETAMINE DIMESYLATE 70 MG PO CAPS: 70.0000 mg | ORAL_CAPSULE | Freq: Every day | ORAL | 0 refills | Status: DC

## 2018-07-22 MED ORDER — ATENOLOL 50 MG PO TABS: 50.0000 mg | ORAL_TABLET | Freq: Every day | ORAL | 1 refills | Status: DC

## 2018-07-22 NOTE — Progress Notes (Signed)
Virtual Visit via Telephone Note  I connected with Yolanda Mosley on 07/22/18 at 10:50 AM EDT by telephone and verified that I am speaking with the correct person using two identifiers.   I discussed the limitations, risks, security and privacy concerns of performing an evaluation and management service by telephone and the availability of in person appointments. I also discussed with the patient that there may be a patient responsible charge related to this service. The patient expressed understanding and agreed to proceed.   History of Present Illness: ADD - Reports symptoms are well controlled on current regime. Denies any problems with insomnia, chest pain, palpitations, or SOB.     Hypertension- Pt denies chest pain, SOB, dizziness, or heart palpitations.  Taking meds as directed w/o problems.  Denies medication side effects.      Observations/Objective: Tele-visit performed.  Patient speaking without difficulty.  Assessment and Plan: ADD-happy with current regimen and asymptomatic.  We will go ahead and refill for the next 90 days.  Hypertension-Home blood pressure looks fantastic continue to monitor periodically.  Make sure taking medication regularly.  Due for refill on atenolol.  Labs are up-to-date.  Follow Up Instructions:  Follow-up in 3 to 4 months.   I discussed the assessment and treatment plan with the patient. The patient was provided an opportunity to ask questions and all were answered. The patient agreed with the plan and demonstrated an understanding of the instructions.   The patient was advised to call back or seek an in-person evaluation if the symptoms worsen or if the condition fails to improve as anticipated.  I provided 9 minutes of non-face-to-face time during this encounter.   Beatrice Lecher, MD

## 2018-07-31 ENCOUNTER — Encounter: Payer: Self-pay | Admitting: Family Medicine

## 2018-07-31 ENCOUNTER — Other Ambulatory Visit: Payer: Self-pay

## 2018-07-31 ENCOUNTER — Telehealth (INDEPENDENT_AMBULATORY_CARE_PROVIDER_SITE_OTHER): Payer: BLUE CROSS/BLUE SHIELD | Admitting: Family Medicine

## 2018-07-31 VITALS — BP 124/83 | HR 76 | Temp 99.3°F | Ht 64.0 in | Wt 250.0 lb

## 2018-07-31 DIAGNOSIS — H9201 Otalgia, right ear: Secondary | ICD-10-CM | POA: Diagnosis not present

## 2018-07-31 DIAGNOSIS — J302 Other seasonal allergic rhinitis: Secondary | ICD-10-CM | POA: Diagnosis not present

## 2018-07-31 MED ORDER — FLUTICASONE PROPIONATE 50 MCG/ACT NA SUSP: 1.0000 | Freq: Every day | NASAL | 2 refills | Status: DC

## 2018-07-31 MED ORDER — PREDNISONE 20 MG PO TABS: 40.0000 mg | ORAL_TABLET | Freq: Every day | ORAL | 0 refills | Status: AC

## 2018-07-31 NOTE — Progress Notes (Signed)
Pt reports sxs 4 days, red swollen eyes (Monday used eye drops), R ear pain yesterday she states that it hurts to chew  She took advil (400 mg) last night. This is the first time that she has actually ran a temp. Last night it was 98.7,(she still work at [employer]). Has not missed any time from work.  Denies n/d/s/c. She does report having a cough and runny nose at the very beginning. No headaches or body ache, facial pain just on the R side of face at her jaw and ear, no popping or clicking of her jaw.  She has been taking the generic zyrtec daily.[name], [name], CMA

## 2018-07-31 NOTE — Progress Notes (Signed)
Virtual Visit via Video Note  I connected with Yolanda Mosley on 07/31/18 at  9:30 AM EDT by a video enabled telemedicine application and verified that I am speaking with the correct person using two identifiers.   I discussed the limitations of evaluation and management by telemedicine and the availability of in person appointments. The patient expressed understanding and agreed to proceed.     Subjective:    CC: right ear pain  HPI:  Pt reports sxs 4-5 days, red swollen eyes (Monday used eye drops), R ear pain  X 3 days yesterday she states that it hurts to chew.  + cough and sneezing.  She took advil (400 mg) last night. Says is did help her pain.  Last night it was 98.7,(she still work at [employer]). Has not missed any time from work. Taking zyrtec daily No ear drainage.  Ear still hurts his morning. No nasal congestion. This morning temp 99.   Denies n/d/s/c.  No headaches or body ache, facial pain just on the R side of face at her jaw and ear, no popping or clicking of her jaw.  She has been taking the generic zyrtec daily..  Past medical history, Surgical history, Family history not pertinant except as noted below, Social history, Allergies, and medications have been entered into the medical record, reviewed, and corrections made.   Review of Systems: No fevers, chills, night sweats, weight loss, chest pain, or shortness of breath.   Objective:    General: Speaking clearly in complete sentences without any shortness of breath.  Alert and oriented x3.  Normal judgment. No apparent acute distress. Appears well groomed.      Impression and Recommendations:    Seasonal allergies.  Will tx with flonase. Continue zyrtec.  Will give 5 days of prednisone.    Right ear pain - likely secondary to allergies.  Will tx with prednisone.  Monitor temp carefull. If runs a fever stay out of work, call back and consider abx tx for ear infection. Use tylenol or Advil prn for pain relief.        I  discussed the assessment and treatment plan with the patient. The patient was provided an opportunity to ask questions and all were answered. The patient agreed with the plan and demonstrated an understanding of the instructions.   The patient was advised to call back or seek an in-person evaluation if the symptoms worsen or if the condition fails to improve as anticipated.  I provided 15 minutes of non-face-to-face time during this encounter.   Beatrice Lecher, MD

## 2018-08-18 ENCOUNTER — Telehealth: Payer: Self-pay | Admitting: Family Medicine

## 2018-08-18 NOTE — Telephone Encounter (Signed)
Ok for note for pt saying to please allow her to remove her mask at intervals bc of her asthma it make it hard for her to breath. Thank you.

## 2018-08-18 NOTE — Telephone Encounter (Signed)
Pt states she works at [employer] and she needs a note saying she can not wear a mask all day every day due to her Asthma and allergies. She feels like she can not breathe with a mask on. Can she have a note? Please call pt and let her know. Thanks

## 2018-08-19 ENCOUNTER — Encounter: Payer: Self-pay | Admitting: *Deleted

## 2018-08-19 NOTE — Telephone Encounter (Signed)
Letter written.Yolanda Mosley, Yolanda Mosley, CMA

## 2018-09-03 ENCOUNTER — Encounter: Payer: BLUE CROSS/BLUE SHIELD | Admitting: Family Medicine

## 2018-10-13 ENCOUNTER — Encounter: Payer: BLUE CROSS/BLUE SHIELD | Admitting: Family Medicine

## 2018-11-14 ENCOUNTER — Encounter: Payer: Self-pay | Admitting: Family Medicine

## 2018-11-14 ENCOUNTER — Ambulatory Visit (INDEPENDENT_AMBULATORY_CARE_PROVIDER_SITE_OTHER): Payer: BC Managed Care – PPO | Admitting: Family Medicine

## 2018-11-14 ENCOUNTER — Other Ambulatory Visit: Payer: Self-pay

## 2018-11-14 VITALS — BP 121/69 | HR 72 | Temp 98.1°F | Ht 64.0 in | Wt 233.0 lb

## 2018-11-14 DIAGNOSIS — J302 Other seasonal allergic rhinitis: Secondary | ICD-10-CM

## 2018-11-14 DIAGNOSIS — H9202 Otalgia, left ear: Secondary | ICD-10-CM

## 2018-11-14 DIAGNOSIS — F902 Attention-deficit hyperactivity disorder, combined type: Secondary | ICD-10-CM | POA: Diagnosis not present

## 2018-11-14 MED ORDER — NEOMYCIN-POLYMYXIN-HC 1 % OT SOLN: 3.0000 [drp] | Freq: Three times a day (TID) | OTIC | 0 refills | Status: DC

## 2018-11-14 NOTE — Assessment & Plan Note (Signed)
Doing well on current regimen but having insomnia issues.  Encouraged her to move back her dosing from around 1230 noon to about 1130 for couple of days and then if no improvement then move forward an additional hour.  Explained that it has a very long half-life on it which can be helpful for getting her through work but could definitely be impacting her sleep.  If that does not help then we can always look at adjusting her dose down as well.  To continue the melatonin for now.

## 2018-11-14 NOTE — Progress Notes (Signed)
Acute Office Visit  Subjective:    Patient ID: Yolanda Mosley, female    DOB: December 25, 1980, 39 y.o.   MRN: [id number]  Chief Complaint  Patient presents with  . Ear Pain    left    HPI Patient is in today for left ear pain.  She has had sinus drainage for one week. She says the ear pain started yesterday.  [place] No fever, chills or sweats.  No difficulty swallowing.  But the pain does radiate down towards her neck and behind her ear.  She denies any known injury or trauma.  No drainage from the ear.  And no hearing loss.  Does feel a little bit full at times.  She also had some questions about her Vyvanse in regards to her ADHD-it is been keeping her awake at night but it does work well but she also does not go to work until 1 or 2:00 in the afternoon and gets off at 10:00 at night and then tries to go to bed around midnight.  She has been using about 15 mg of melatonin and says it does help her fall asleep but then she still wakes up at least twice at night and if she does not take it at all she wakes up 3-4 times at night.    Past Medical History:  Diagnosis Date  . Allergy   . Anemia   . Asthma   . 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 GERD (gastroesophageal reflux disease)   . Hemorrhoids    laser tx   . Herpes simplex virus (HSV) infection   . Hypertension   . Obesity   . Ovarian cyst    left  . Sleep apnea   . Tachycardia     Past Surgical History:  Procedure Laterality Date  . OMENTECTOMY  2005  . ovary, bilater fallopian tubes removed  2005  . SALPINGOOPHORECTOMY Right     Family History  Problem Relation Age of Onset  . Hypertension Mother   . Diabetes Other   . Other Other        cardiovascular disorder    Social History   Socioeconomic History  . Marital status: Single    Spouse name: Not on file  . Number of children: Not on file  . Years of education: Not on file  . Highest education level: Not on file   Occupational History  . Not on file  Social Needs  . Financial resource strain: Not on file  . Food insecurity    Worry: Not on file    Inability: Not on file  . Transportation needs    Medical: Not on file    Non-medical: Not on file  Tobacco Use  . Smoking status: Never Smoker  . Smokeless tobacco: Never Used  Substance and Sexual Activity  . Alcohol use: No  . Drug use: No  . Sexual activity: Yes    Birth control/protection: None  Lifestyle  . Physical activity    Days per week: Not on file    Minutes per session: Not on file  . Stress: Not on file  Relationships  . Social Herbalist on phone: Not on file    Gets together: Not on file    Attends religious service: Not on file    Active member of club or organization: Not on file    Attends meetings of clubs  or organizations: Not on file    Relationship status: Not on file  . Intimate partner violence    Fear of current or ex partner: Not on file    Emotionally abused: Not on file    Physically abused: Not on file    Forced sexual activity: Not on file  Other Topics Concern  . Not on file  Social History Narrative  . Not on file    Outpatient Medications Prior to Visit  Medication Sig Dispense Refill  . Albuterol Sulfate (PROAIR RESPICLICK) 657 (90 Base) MCG/ACT AEPB Inhale 2 puffs into the lungs every 8 (eight) hours as needed. 1 each 0  . atenolol (TENORMIN) 50 MG tablet Take 1 tablet (50 mg total) by mouth daily. 90 tablet 1  . benazepril-hydrochlorthiazide (LOTENSIN HCT) 10-12.5 MG tablet TAKE 1 TABLET BY MOUTH ONCE DAILY 90 tablet 1  . [facility] ALLERGY RELIEF 180 MG tablet TAKE 1 TABLET (180 MG TOTAL) BY MOUTH DAILY. 90 tablet 0  . fluticasone (FLONASE) 50 MCG/ACT nasal spray Place 1-2 sprays into both nostrils daily. 16 g 2  . lisdexamfetamine (VYVANSE) 70 MG capsule Take 1 capsule (70 mg total) by mouth every morning. 30 capsule 0  . lisdexamfetamine (VYVANSE) 70 MG capsule Take 1 capsule (70 mg  total) by mouth daily. 30 capsule 0  . lisdexamfetamine (VYVANSE) 70 MG capsule Take 1 capsule (70 mg total) by mouth daily. 30 capsule 0  . omeprazole (PRILOSEC) 40 MG capsule Take 1 capsule (40 mg total) by mouth daily. 30 capsule 3   No facility-administered medications prior to visit.     Allergies  Allergen Reactions  . Other Rash  . Hydrocodone Other (See Comments)    Dizzy , nauseous, swimmy headed.(Can take with phenergan)  . Keflex [Cephalexin]     Diarrhea and more abdominal cramping Diarrhea and more abdominal cramping  . Codeine Nausea Only  . Tape Rash    ROS     Objective:    Physical Exam  Constitutional: She is oriented to person, place, and time. She appears well-developed and well-nourished.  HENT:  Head: Normocephalic and atraumatic.  Right Ear: External ear normal.  Left Ear: External ear normal.  Nose: Nose normal.  Mouth/Throat: Oropharynx is clear and moist.  TMs and canals are clear.   Eyes: Pupils are equal, round, and reactive to light. Conjunctivae and EOM are normal.  Neck: Neck supple. No thyromegaly present.  Cardiovascular: Normal rate, regular rhythm and normal heart sounds.  Pulmonary/Chest: Effort normal and breath sounds normal. She has no wheezes.  Lymphadenopathy:    She has no cervical adenopathy.  Neurological: She is alert and oriented to person, place, and time.  Skin: Skin is warm and dry.  Psychiatric: She has a normal mood and affect.    BP 121/69   Pulse 72   Temp 98.1 F (36.7 C) (Oral)   Ht 5\' 4"  (1.626 m)   Wt 233 lb (105.7 kg)   BMI 39.99 kg/m  Wt Readings from Last 3 Encounters:  11/14/18 233 lb (105.7 kg)  07/31/18 250 lb (113.4 kg)  07/22/18 251 lb (113.9 kg)    There are no preventive care reminders to display for this patient.  There are no preventive care reminders to display for this patient.   Lab Results  Component Value Date   TSH 3.645 01/21/2014   Lab Results  Component Value Date   WBC  6.4 01/11/2015   HGB 12.1 01/11/2015   HCT 36.2  01/11/2015   MCV 80.8 01/11/2015   PLT 238 01/11/2015   Lab Results  Component Value Date   NA 138 06/10/2018   K 3.7 06/10/2018   CO2 26 06/10/2018   GLUCOSE 96 06/10/2018   BUN 12 06/10/2018   CREATININE 0.71 06/10/2018   BILITOT 0.4 06/10/2018   ALKPHOS 109 11/04/2015   AST 18 06/10/2018   ALT 19 06/10/2018   PROT 6.6 06/10/2018   ALBUMIN 4.0 11/04/2015   CALCIUM 9.4 06/10/2018   Lab Results  Component Value Date   CHOL 147 06/10/2018   Lab Results  Component Value Date   HDL 50 06/10/2018   Lab Results  Component Value Date   LDLCALC 79 06/10/2018   Lab Results  Component Value Date   TRIG 92 06/10/2018   Lab Results  Component Value Date   CHOLHDL 2.9 06/10/2018   No results found for: HGBA1C     Assessment & Plan:   Problem List Items Addressed This Visit      Other   ADHD    Doing well on current regimen but having insomnia issues.  Encouraged her to move back her dosing from around 1230 noon to about 1130 for couple of days and then if no improvement then move forward an additional hour.  Explained that it has a very long half-life on it which can be helpful for getting her through work but could definitely be impacting her sleep.  If that does not help then we can always look at adjusting her dose down as well.  To continue the melatonin for now.       Other Visit Diagnoses    Left ear pain    -  Primary   Seasonal allergies         Left ear pain-exam is completely normal.  Gave reassurance.  Possibly some eustachian tube dysfunction or related to her allergies.  She has a surgical procedures planned on Monday so she will call me back afterwards if she is continuing to have pain in the ear.  I did send over some eardrops for otitis externa if she starts to feel like it is more like a swimmer's ear over the weekend since her symptoms are still quite early.  Or if she starts to notice any discharge or  moisture from the ear.  Meds ordered this encounter  Medications  . NEOMYCIN-POLYMYXIN-HYDROCORTISONE (CORTISPORIN) 1 % SOLN OTIC solution    Sig: Place 3 drops into the left ear every 8 (eight) hours. X 7 days    Dispense:  10 mL    Refill:  0    Pat will call when ready to pick up     Beatrice Lecher, MD

## 2018-12-03 ENCOUNTER — Telehealth: Payer: Self-pay | Admitting: Neurology

## 2018-12-03 ENCOUNTER — Encounter: Payer: Self-pay | Admitting: Physician Assistant

## 2018-12-03 ENCOUNTER — Other Ambulatory Visit: Payer: Self-pay

## 2018-12-03 ENCOUNTER — Ambulatory Visit (INDEPENDENT_AMBULATORY_CARE_PROVIDER_SITE_OTHER): Payer: BC Managed Care – PPO | Admitting: Physician Assistant

## 2018-12-03 VITALS — BP 123/68 | HR 67 | Temp 97.6°F | Ht 64.0 in | Wt 230.0 lb

## 2018-12-03 DIAGNOSIS — K1120 Sialoadenitis, unspecified: Secondary | ICD-10-CM | POA: Diagnosis not present

## 2018-12-03 MED ORDER — IBUPROFEN 800 MG PO TABS: 800.0000 mg | ORAL_TABLET | Freq: Three times a day (TID) | ORAL | 0 refills | Status: DC

## 2018-12-03 MED ORDER — AMOXICILLIN-POT CLAVULANATE 875-125 MG PO TABS: 1.0000 | ORAL_TABLET | Freq: Two times a day (BID) | ORAL | 0 refills | Status: DC

## 2018-12-03 NOTE — Patient Instructions (Signed)
Parotitis  Parotitis is inflammation of one or both of your parotid glands. These glands produce saliva. They are found on each side of your face, below and in front of your earlobes. The saliva that they produce comes out of tiny openings (ducts) inside your cheeks. Parotitis may cause sudden swelling and pain (acute parotitis). It can also cause repeated episodes of swelling and pain or continued swelling that may or may not be painful (chronic parotitis). What are the causes? This condition may be caused by:  Infections from bacteria.  Infections from viruses, such as mumps or HIV.  Blockage (obstruction) of saliva flow through the parotid glands. This can be from a stone, scar tissue, or a tumor.  Diseases that cause your body's defense system (immune system) to attack healthy cells in your salivary glands. These are called autoimmune diseases. What increases the risk? You are more likely to develop this condition if:  You are 50 years old or older.  You do not drink enough fluids (are dehydrated).  You drink too much alcohol.  You have: ? A dry mouth. ? Poor dental hygiene. ? Diabetes. ? Gout. ? A long-term illness.  You have had radiation treatments to the head and neck.  You take certain medicines. What are the signs or symptoms? Symptoms of this condition depend on the cause. Symptoms may include:  Swelling under and in front of the ear. This may get worse after eating.  Redness of the skin over the parotid gland.  Pain and tenderness over the parotid gland. This may get worse after eating.  Fever or chills.  Pus coming from the ducts inside the mouth.  Dry mouth.  A bad taste in the mouth. How is this diagnosed? This condition may be diagnosed based on:  Your medical history.  A physical exam.  Tests to find the cause of the parotitis. These may include: ? Doing blood tests to check for an autoimmune disease or infections from a virus. ? Taking a  fluid sample from the parotid gland and testing it for infection. ? Injecting the ducts of the parotid gland with a dye and then taking X-rays (sialogram). ? Having other imaging tests of the gland, such as X-rays, ultrasound, MRI, or CT scan. ? Checking the opening of the gland for a stone or obstruction. ? Placing a needle into the gland to remove tissue for a biopsy (fine needle aspiration). How is this treated? Treatment for this condition depends on the cause. Treatment may include:  Antibiotic medicine for a bacterial infection.  Drinking more fluids.  Removing a stone or obstruction.  Treating an underlying disease that is causing parotitis.  Surgery to drain an infection, remove a growth, or remove the whole gland (parotidectomy). Treatment may not be needed if parotid swelling goes away with home care. Follow these instructions at home: Medicines   Take over-the-counter and prescription medicines only as told by your health care provider.  If you were prescribed an antibiotic medicine, take it as told by your health care provider. Do not stop taking the antibiotic even if you start to feel better. Managing pain and swelling  If directed, apply heat to the affected area as often as told by your health care provider. Use the heat source that your health care provider recommends, such as a moist heat pack or a heating pad. To apply the heat: ? Place a towel between your skin and the heat source. ? Leave the heat on for 20-30 minutes. ?   Remove the heat if your skin turns bright red. This is especially important if you are unable to feel pain, heat, or cold. You may have a greater risk of getting burned.  Gargle with a salt-water mixture 3-4 times a day or as needed. To make a salt-water mixture, completely dissolve -1 tsp (3-6 g) of salt in 1 cup (237 mL) of warm water.  Gently massage the parotid glands as told by your health care provider. General instructions   Drink  enough fluid to keep your urine pale yellow.  Keep your mouth clean and moist.  Try sucking on sour candy. This may help to make your mouth less dry by stimulating the flow of saliva.  Maintain good oral health. ? Brush your teeth at least two times a day. ? Floss your teeth every day. ? See your dentist regularly.  Do not use any products that contain nicotine or tobacco, such as cigarettes, e-cigarettes, and chewing tobacco. If you need help quitting, ask your health care provider.  Do not drink alcohol.  Keep all follow-up visits as told by your health care provider. This is important. Contact a health care provider if:  You have a fever or chills.  You have new symptoms.  Your symptoms get worse.  Your symptoms do not improve with treatment. Get help right away if:  You have difficulty breathing or swallowing because of the swollen gland. Summary  Parotitis is inflammation of one or both of your parotid glands.  Symptoms include pain and swelling under and in front of the ear. They may also include a fever and a bad taste in your mouth.  This condition may be treated with antibiotics, increasing fluids, or surgery.  In some cases, parotitis may go away on its own without treatment.  You should drink plenty of fluids, maintain good oral hygiene, and avoid tobacco products. This information is not intended to replace advice given to you by your health care provider. Make sure you discuss any questions you have with your health care provider. Document Released: 10/06/2001 Document Revised: 11/12/2017 Document Reviewed: 11/12/2017 Elsevier Patient Education  [place].

## 2018-12-03 NOTE — Progress Notes (Signed)
Subjective:    Patient ID: Yolanda Mosley, female    DOB: October 26, 1980, 38 y.o.   MRN: [id number]  HPI  Pt is a 38 yo female who presents to the clinic with 3 days of left ear, jaw, neck pain. She started having mild symptoms when she saw metheney last week and Dr. Jerilynn Mages did not see anything but treated her for otitis externa with cortisporin. She stated it may have gotten a little better but now for 3 days getting worse. Denies any teeth pain, fever, chills, ear drainage, sinus pressure, ST. Her jaw is tender to touch, her left side of neck is tender to touch. She does have an 67yr daughter at home. No sick contacts.   .. Active Ambulatory Problems    Diagnosis Date Noted  . HYPERTENSION, MILD 01/09/2010  . EXTERNAL HEMORRHOIDS 01/09/2010  . ALLERGIC RHINITIS 01/09/2010  . OSA on CPAP 01/08/2011  . ADHD 03/01/2011  . Obesity 05/31/2011  . Tachycardia 04/15/2012  . Anemia, iron deficiency 09/09/2013  . Gastric ulcer 09/14/2013  . History of ovarian cancer 01/20/2014  . Muscle spasm of back 08/10/2014  . Mid back pain 08/10/2014  . Bilateral low back pain without sciatica 08/10/2014  . DDD (degenerative disc disease), lumbar 08/11/2014  . 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 Epicondylitis, lateral (tennis elbow) 01/24/2016  . Gastroesophageal reflux disease with esophagitis 08/16/2016  . Mild persistent asthma without complication 48/05/6551  . History of tachycardia 10/22/2016  . Peripheral edema 10/22/2016  . Venous stasis dermatitis of both lower extremities 10/23/2016  . Mild mitral regurgitation by prior echocardiogram 11/14/2016  . Erythematous skin nodule 11/29/2016  . Post chemo evaluation 04/04/2015  . 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 RINGWORM 04/03/2010  . Scabies 04/03/2010  . SINUSITIS - ACUTE-NOS 04/03/2010  . VAGINITIS 01/09/2010  . Otitis externa of right ear 07/21/2012  . Viral syndrome 02/04/2014   . Posterior tibial tendonitis, left 07/12/2016  . Acute diarrhea 10/29/2016   Past Medical History:  Diagnosis Date  . Allergy   . Anemia   . Asthma   . CA - cancer 2005  . GERD (gastroesophageal reflux disease)   . Hemorrhoids   . Herpes simplex virus (HSV) infection   . Hypertension   . Ovarian cyst   . Sleep apnea      Review of Systems See HPI.     Objective:   Physical Exam Vitals signs reviewed.  Constitutional:      Appearance: Normal appearance.  HENT:     Head: Normocephalic.     Right Ear: Tympanic membrane and ear canal normal.     Left Ear: Tympanic membrane and ear canal normal.     Nose: Nose normal. No congestion.     Mouth/Throat:     Pharynx: Oropharynx is clear. No oropharyngeal exudate.  Eyes:     Extraocular Movements: Extraocular movements intact.     Conjunctiva/sclera: Conjunctivae normal.     Pupils: Pupils are equal, round, and reactive to light.  Neck:     Comments: Tender swollen left anterior cervical neck.  Red, swollen, tender left parotid gland.  Cardiovascular:     Rate and Rhythm: Normal rate and regular rhythm.  Pulmonary:     Effort: Pulmonary effort is normal.     Breath sounds: Normal breath sounds.  Neurological:     General: No focal deficit present.     Mental Status: She is  alert and oriented to person, place, and time.  Psychiatric:        Mood and Affect: Mood normal.           Assessment & Plan:  Marland KitchenMarland KitchenAmber was seen today for ear pain.  Diagnoses and all orders for this visit:  Parotiditis -     Mumps Antibody, IgM -     CBC with Differential/Platelet -     amoxicillin-clavulanate (AUGMENTIN) 875-125 MG tablet; Take 1 tablet by mouth 2 (two) times daily. -     ibuprofen (ADVIL) 800 MG tablet; Take 1 tablet (800 mg total) by mouth 3 (three) times daily.   Her left parotid gland appears to be swollen. Pt has had immunization. Will test for mumps. Treated with augmentin. Use warm compresses and ibuprofen for  pain relief. Suck on hard sour candy incase there is a salivary stone present. Written out of work for 2 days. Follow up as needed or if symptoms worsen.

## 2018-12-03 NOTE — Telephone Encounter (Signed)
Patient called back and states the vaccine she did not receive as a child was pertussis.   She did have labs drawn in the office today.

## 2018-12-05 ENCOUNTER — Other Ambulatory Visit: Payer: Self-pay | Admitting: Physician Assistant

## 2018-12-05 ENCOUNTER — Telehealth: Payer: Self-pay

## 2018-12-05 MED ORDER — PREDNISONE 20 MG PO TABS: ORAL_TABLET | ORAL | 0 refills | Status: DC

## 2018-12-05 NOTE — Telephone Encounter (Signed)
Can we call downstairs and see what kind of CT to order to look for salivary stones?

## 2018-12-05 NOTE — Progress Notes (Signed)
I sent prednisone to add to treatment plan for patients pain? Waiting on approval of CT of soft tissue to look for stone.

## 2018-12-05 NOTE — Addendum Note (Signed)
Addended by: Donella Stade on: 12/05/2018 02:23 PM   Modules accepted: Orders

## 2018-12-05 NOTE — Telephone Encounter (Signed)
Yolanda Mosley called and states she is still having pain. Her pain level is 5-6 without Tylenol. It is 2-3 with Tylenol.    I called the lab and the Mumps will not be ran until next week.

## 2018-12-06 ENCOUNTER — Encounter: Payer: Self-pay | Admitting: Physician Assistant

## 2018-12-06 DIAGNOSIS — I10 Essential (primary) hypertension: Secondary | ICD-10-CM

## 2018-12-06 LAB — CBC WITH DIFFERENTIAL/PLATELET
Absolute Monocytes: 534 cells/uL (ref 200–950)
Basophils Absolute: 78 cells/uL (ref 0–200)
Basophils Relative: 0.8 %
Eosinophils Absolute: 349 cells/uL (ref 15–500)
Eosinophils Relative: 3.6 %
HCT: 43.4 % (ref 35.0–45.0)
Hemoglobin: 14.5 g/dL (ref 11.7–15.5)
Lymphs Abs: 3191 cells/uL (ref 850–3900)
MCH: 28.7 pg (ref 27.0–33.0)
MCHC: 33.4 g/dL (ref 32.0–36.0)
MCV: 85.8 fL (ref 80.0–100.0)
MPV: 12.2 fL (ref 7.5–12.5)
Monocytes Relative: 5.5 %
Neutro Abs: 5548 cells/uL (ref 1500–7800)
Neutrophils Relative %: 57.2 %
Platelets: 270 10*3/uL (ref 140–400)
RBC: 5.06 10*6/uL (ref 3.80–5.10)
RDW: 13.5 % (ref 11.0–15.0)
Total Lymphocyte: 32.9 %
WBC: 9.7 10*3/uL (ref 3.8–10.8)

## 2018-12-06 LAB — MUMPS ANTIBODY, IGM: Mumps IgM Value: <1:20 {titer}

## 2018-12-06 NOTE — Progress Notes (Signed)
Negative for mumps. How are you feeling?

## 2018-12-08 ENCOUNTER — Other Ambulatory Visit: Payer: Self-pay | Admitting: Physician Assistant

## 2018-12-08 ENCOUNTER — Encounter: Payer: Self-pay | Admitting: Family Medicine

## 2018-12-08 ENCOUNTER — Ambulatory Visit
Admission: RE | Admit: 2018-12-08 | Discharge: 2018-12-08 | Disposition: A | Discharge status: Home / Self Care | Payer: BC Managed Care – PPO | Source: Ambulatory Visit | Attending: Physician Assistant | Admitting: Physician Assistant

## 2018-12-08 ENCOUNTER — Ambulatory Visit (HOSPITAL_BASED_OUTPATIENT_CLINIC_OR_DEPARTMENT_OTHER): Payer: BC Managed Care – PPO

## 2018-12-08 DIAGNOSIS — R609 Edema, unspecified: Secondary | ICD-10-CM

## 2018-12-08 LAB — BASIC METABOLIC PANEL WITH GFR
BUN: 23 mg/dL (ref 7–25)
CO2: 33 mmol/L — ABNORMAL HIGH (ref 20–32)
Calcium: 9.6 mg/dL (ref 8.6–10.2)
Chloride: 102 mmol/L (ref 98–110)
Creat: 0.78 mg/dL (ref 0.50–1.10)
GFR, Est African American: 113 mL/min/{1.73_m2} (ref >=60)
GFR, Est Non African American: 98 mL/min/{1.73_m2} (ref >=60)
Glucose, Bld: 75 mg/dL (ref 65–99)
Potassium: 3.2 mmol/L — ABNORMAL LOW (ref 3.5–5.3)
Sodium: 140 mmol/L (ref 135–146)

## 2018-12-08 MED ORDER — IOPAMIDOL (ISOVUE-300) INJECTION 61%: 75.0000 mL | Freq: Once | INTRAVENOUS | Status: DC | PRN

## 2018-12-08 MED ORDER — FLUCONAZOLE 150 MG PO TABS: 150.0000 mg | ORAL_TABLET | Freq: Once | ORAL | 0 refills | Status: AC

## 2018-12-08 NOTE — Telephone Encounter (Signed)
Diflucan sent to [facility]

## 2018-12-09 ENCOUNTER — Other Ambulatory Visit: Payer: Self-pay | Admitting: Physician Assistant

## 2018-12-09 ENCOUNTER — Ambulatory Visit
Admission: RE | Admit: 2018-12-09 | Discharge: 2018-12-09 | Disposition: A | Discharge status: Home / Self Care | Payer: BC Managed Care – PPO | Source: Ambulatory Visit | Attending: Physician Assistant | Admitting: Physician Assistant

## 2018-12-09 ENCOUNTER — Telehealth: Payer: Self-pay

## 2018-12-09 ENCOUNTER — Other Ambulatory Visit: Payer: BC Managed Care – PPO

## 2018-12-09 ENCOUNTER — Other Ambulatory Visit: Payer: Self-pay

## 2018-12-09 DIAGNOSIS — R609 Edema, unspecified: Secondary | ICD-10-CM

## 2018-12-09 DIAGNOSIS — K1120 Sialoadenitis, unspecified: Secondary | ICD-10-CM

## 2018-12-09 MED ORDER — IOPAMIDOL (ISOVUE-300) INJECTION 61%
75.0000 mL | Freq: Once | INTRAVENOUS | Status: AC | PRN
  Administered 2018-12-09: 75 mL via INTRAVENOUS

## 2018-12-09 MED ORDER — AMOXICILLIN-POT CLAVULANATE 875-125 MG PO TABS: 1.0000 | ORAL_TABLET | Freq: Two times a day (BID) | ORAL | 0 refills | Status: DC

## 2018-12-09 NOTE — Telephone Encounter (Signed)
I called patient and left a message for a return call.   See MyChart message.

## 2018-12-09 NOTE — Telephone Encounter (Signed)
She states takes half a tablet of the Atenolol and a full tablet of the Benazepril. She wanted to know which blood pressure medication she should increase.   Patient advised of the results and recommendations.

## 2018-12-09 NOTE — Telephone Encounter (Signed)
Okay, please call patient: Parotid glands actually were normal.  No sign of inflammation.  She did have a little bit of swelling in the left ear canal and there was indication that she has an acute on chronic left maxillary sinus infection.  The Augmentin should help and should actually clear this up but we may need to extend her course.  So make sure to finish the 10 days that she was given and then pick up the second prescription and add an extra 10 days for a total of 20 days on antibiotics.  If at that point she still having a lot of pain or problems then please let us know.

## 2018-12-09 NOTE — Telephone Encounter (Signed)
Sorry for the confusion, increase the benazepril to 2 tabs QD

## 2018-12-09 NOTE — Telephone Encounter (Signed)
Done

## 2018-12-09 NOTE — Telephone Encounter (Signed)
Yolanda Mosley states she is still having left side ear and face pain. She is wanting to know about the CT results.    EXAM: CT NECK WITH CONTRAST  TECHNIQUE: Multidetector CT imaging of the neck was performed using the standard protocol following the bolus administration of intravenous contrast.  CONTRAST:  7mL ISOVUE-300 IOPAMIDOL (ISOVUE-300) INJECTION 61%  COMPARISON:  None.  FINDINGS: Pharynx and larynx: No focal mucosal or submucosal lesions are present. Nasopharynx is within normal limits. Soft palate is unremarkable. There is mild prominence of the palatine tonsils bilaterally without a discrete lesion. Tongue base is normal. Epiglottis is within normal limits. Vocal cords are symmetric and within normal limits. Trachea is unremarkable.  Salivary glands: The parotid glands are within normal limits bilaterally. No mass lesion or inflammatory changes are evident. Submandibular glands are unremarkable.  Thyroid: Normal.  Lymph nodes: No significant cervical adenopathy is present  Vascular: No significant vascular calcifications or focal vascular lesions are present.  Limited intracranial: Within normal limits  Visualized orbits: The globes and orbits are within normal limits.  Mastoids and visualized paranasal sinuses: Chronic left maxillary sinus disease is present. A fluid level is present with circumferential mucosal thickening and some bone thickening. Soft tissue fills the ostiomeatal complex. The paranasal sinuses and mastoid air cells are otherwise clear.  Skeleton: Unremarkable  Upper chest: The lung apices are clear. Thoracic inlet is within normal limits.  Other: There is slight asymmetry of enhancement about the left external auditory canal. No discrete lesion is present. There is no osseous abnormality.  IMPRESSION: 1. Normal appearance of the parotid glands bilaterally. No significant asymmetry or inflammatory change. 2. Slight  difference in enhancement of the external auditory canal on the left. This may be within normal limits. Question inflammation of the external auditory canal. No osseous lesion is present. 3. Acute on chronic left maxillary sinus disease.   Electronically Signed   By: San Morelle M.D.   On: 12/09/2018 09:54

## 2018-12-09 NOTE — Telephone Encounter (Signed)
See telephone note.

## 2018-12-10 NOTE — Progress Notes (Signed)
Call pt: chronic left maxillary sinusitis. Left external canal does appear inflamed. Augmentin should help with sinusitis. Prednisone should help as well. How do you feel today?

## 2018-12-10 NOTE — Telephone Encounter (Signed)
Patient notified and did not have any additional questions.

## 2018-12-12 ENCOUNTER — Telehealth: Payer: Self-pay

## 2018-12-12 MED ORDER — VALACYCLOVIR HCL 1 G PO TABS: 1000.0000 mg | ORAL_TABLET | Freq: Two times a day (BID) | ORAL | 0 refills | Status: DC

## 2018-12-12 NOTE — Telephone Encounter (Signed)
Left pt msg advising new RX sent to pharmacy

## 2018-12-12 NOTE — Telephone Encounter (Signed)
I think valacyclovir works better. I sent over a new script.

## 2018-12-12 NOTE — Telephone Encounter (Signed)
Yolanda Mosley called and states she has a terrible out break of genital herpes. She went to the ED and they prescribed acyclovir 400 mg TID. She states this is not helping. She would like a stronger dose or cream if that would help. Please advise.

## 2018-12-17 ENCOUNTER — Other Ambulatory Visit: Payer: Self-pay | Admitting: Family Medicine

## 2018-12-17 DIAGNOSIS — I10 Essential (primary) hypertension: Secondary | ICD-10-CM

## 2019-01-21 ENCOUNTER — Other Ambulatory Visit: Payer: Self-pay

## 2019-01-21 MED ORDER — LISDEXAMFETAMINE DIMESYLATE 70 MG PO CAPS: 70.0000 mg | ORAL_CAPSULE | Freq: Every day | ORAL | 0 refills | Status: DC

## 2019-01-21 NOTE — Telephone Encounter (Signed)
Left a detailed vm msg for pt regarding med refill sent to local pharmacy. Direct call back info provided.  

## 2019-02-05 ENCOUNTER — Encounter: Payer: Self-pay | Admitting: Family Medicine

## 2019-02-05 ENCOUNTER — Ambulatory Visit (INDEPENDENT_AMBULATORY_CARE_PROVIDER_SITE_OTHER): Payer: BC Managed Care – PPO | Admitting: Family Medicine

## 2019-02-05 ENCOUNTER — Other Ambulatory Visit: Payer: Self-pay

## 2019-02-05 VITALS — BP 119/84 | HR 67 | Wt 227.0 lb

## 2019-02-05 DIAGNOSIS — R1030 Lower abdominal pain, unspecified: Secondary | ICD-10-CM | POA: Diagnosis not present

## 2019-02-05 LAB — POCT URINALYSIS DIPSTICK
Bilirubin, UA: NEGATIVE
Blood, UA: NEGATIVE
Glucose, UA: NEGATIVE
Ketones, UA: NEGATIVE
Leukocytes, UA: NEGATIVE
Nitrite, UA: NEGATIVE
Protein, UA: NEGATIVE
Spec Grav, UA: 1.025 (ref 1.010–1.025)
Urobilinogen, UA: 0.2 E.U./dL
pH, UA: 7.5 (ref 5.0–8.0)

## 2019-02-05 NOTE — Progress Notes (Signed)
Yolanda Mosley is a 38 y.o. female who presents to [place]: [place] today for dysuria and pelvic or abdominal pain.  Starting yesterday patient developed mild central pelvic pain extending more into the mid abdomen today.  She notes a little bit of urinary frequency and a little bit of pain with urination.  She denies any cloudy urine or change in urine smell.  Her last menstrual period was 9 days ago.  She is here today because she is planning on driving to [place] to visit family in about 6 days on Wednesday, October 14 and she is worried that she may get worse while driving.  She has a history of recurrent ovarian cysts with hemorrhagic cyst last evaluated via pelvic ultrasound in February 2020.  She notes the pain that she is having is somewhat consistent with ovarian pain previously although the timing is a bit unusual.  She typically will have ovarian pain due to cyst a few days prior to her menstrual period not a week after her menstrual period.   She denies any fevers or chills and feels pretty well otherwise.   ROS as above:  Exam:  BP 119/84   Pulse 67   Wt 227 lb (103 kg)   BMI 38.96 kg/m  Wt Readings from Last 5 Encounters:  02/05/19 227 lb (103 kg)  12/03/18 230 lb (104.3 kg)  11/14/18 233 lb (105.7 kg)  07/31/18 250 lb (113.4 kg)  07/22/18 251 lb (113.9 kg)    Gen: Well NAD nontoxic appearing HEENT: EOMI,  MMM Lungs: Normal work of breathing. CTABL Heart: RRR no MRG Abd: NABS, Soft. Nondistended, not particular tender palpation no rebound or guarding.  No masses palpated.  No CVA angle tenderness to percussion Exts: Brisk capillary refill, warm and well perfused.   Lab and Radiology Results Results for orders placed or performed in visit on 02/05/19 (from the past 72 hour(s))  POCT Urinalysis Dipstick     Status: Normal   Collection Time: 02/05/19 11:13  AM  Result Value Ref Range   Color, UA yellow    Clarity, UA clear    Glucose, UA Negative Negative   Bilirubin, UA negative    Ketones, UA negative    Spec Grav, UA 1.025 1.010 - 1.025   Blood, UA negative    pH, UA 7.5 5.0 - 8.0   Protein, UA Negative Negative   Urobilinogen, UA 0.2 0.2 or 1.0 E.U./dL   Nitrite, UA negative    Leukocytes, UA Negative Negative   Appearance     Odor     No results found.    Assessment and Plan: 38 y.o. female with  Pelvic discomfort.  Etiology somewhat unclear.  Urinalysis is normal.  Patient denies any other significant signs or symptoms.  She would like to defer pelvic exam today if possible.  Her main goal for today's visit was to make sure she does not have a urinary tract infection prior to her upcoming road trip.  I believe it is reasonable to proceed with some watchful waiting.  If worsening over the weekend can proceed with further evaluation and treatment if needed.  Watchful waiting recheck as needed.  PDMP not reviewed this encounter. Orders Placed This Encounter  Procedures  . POCT Urinalysis Dipstick   No orders of the defined types were placed in this encounter.    Historical information moved to improve visibility of documentation.  Past Medical History:  Diagnosis Date  . Allergy   . Anemia   . Asthma   . 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 GERD (gastroesophageal reflux disease)   . Hemorrhoids    laser tx   . Herpes simplex virus (HSV) infection   . Hypertension   . Obesity   . Ovarian cyst    left  . Sleep apnea   . Tachycardia    Past Surgical History:  Procedure Laterality Date  . OMENTECTOMY  2005  . ovary, bilater fallopian tubes removed  2005  . SALPINGOOPHORECTOMY Right    Social History   Tobacco Use  . Smoking status: Never Smoker  . Smokeless tobacco: Never Used  Substance Use Topics  . Alcohol use: No   family history includes Diabetes in an other family member; Hypertension  in her mother; Other in an other family member.  Medications: Current Outpatient Medications  Medication Sig Dispense Refill  . Albuterol Sulfate (PROAIR RESPICLICK) 123XX123 (90 Base) MCG/ACT AEPB Inhale 2 puffs into the lungs every 8 (eight) hours as needed. 1 each 0  . atenolol (TENORMIN) 50 MG tablet Take 1 tablet (50 mg total) by mouth daily. 90 tablet 1  . benazepril-hydrochlorthiazide (LOTENSIN HCT) 10-12.5 MG tablet Take 1 tablet by mouth once daily 90 tablet 1  . [facility] ALLERGY RELIEF 180 MG tablet TAKE 1 TABLET (180 MG TOTAL) BY MOUTH DAILY. 90 tablet 0  . fluticasone (FLONASE) 50 MCG/ACT nasal spray Place 1-2 sprays into both nostrils daily. 16 g 2  . ibuprofen (ADVIL) 800 MG tablet Take 1 tablet (800 mg total) by mouth 3 (three) times daily. 90 tablet 0  . lisdexamfetamine (VYVANSE) 70 MG capsule Take 1 capsule (70 mg total) by mouth every morning. 30 capsule 0  . lisdexamfetamine (VYVANSE) 70 MG capsule Take 1 capsule (70 mg total) by mouth daily. 30 capsule 0  . lisdexamfetamine (VYVANSE) 70 MG capsule Take 1 capsule (70 mg total) by mouth daily. 30 capsule 0  . valACYclovir (VALTREX) 1000 MG tablet Take 1 tablet (1,000 mg total) by mouth 2 (two) times daily. 20 tablet 0   No current facility-administered medications for this visit.    Allergies  Allergen Reactions  . Other Rash  . Hydrocodone Other (See Comments)    Dizzy , nauseous, swimmy headed.(Can take with phenergan)  . Keflex [Cephalexin]     Diarrhea and more abdominal cramping Diarrhea and more abdominal cramping  . Codeine Nausea Only  . Tape Rash     Discussed warning signs or symptoms. Please see discharge instructions. Patient expresses understanding.

## 2019-02-05 NOTE — Patient Instructions (Signed)
Thank you for coming in today. If is not 100% clear why you are having discomfort.  I think your theory of ovarian cyst is probably right.  However if worsening over the weekend let us know and we can see you again or do more tests.

## 2019-03-02 ENCOUNTER — Other Ambulatory Visit: Payer: Self-pay

## 2019-03-02 MED ORDER — LISDEXAMFETAMINE DIMESYLATE 70 MG PO CAPS: 70.0000 mg | ORAL_CAPSULE | Freq: Every day | ORAL | 0 refills | Status: DC

## 2019-03-02 NOTE — Telephone Encounter (Signed)
Yolanda Mosley called to request a refill on Vyvanse. She has been scheduled for a follow up on 03/09/2019.

## 2019-03-09 ENCOUNTER — Other Ambulatory Visit: Payer: Self-pay

## 2019-03-09 ENCOUNTER — Encounter: Payer: Self-pay | Admitting: Family Medicine

## 2019-03-09 ENCOUNTER — Ambulatory Visit (INDEPENDENT_AMBULATORY_CARE_PROVIDER_SITE_OTHER): Payer: BC Managed Care – PPO | Admitting: Family Medicine

## 2019-03-09 ENCOUNTER — Telehealth: Payer: Self-pay

## 2019-03-09 VITALS — BP 114/70 | HR 71 | Ht 64.0 in | Wt 227.0 lb

## 2019-03-09 DIAGNOSIS — I10 Essential (primary) hypertension: Secondary | ICD-10-CM

## 2019-03-09 DIAGNOSIS — J4530 Mild persistent asthma, uncomplicated: Secondary | ICD-10-CM

## 2019-03-09 DIAGNOSIS — F902 Attention-deficit hyperactivity disorder, combined type: Secondary | ICD-10-CM | POA: Diagnosis not present

## 2019-03-09 MED ORDER — LISDEXAMFETAMINE DIMESYLATE 70 MG PO CAPS: 70.0000 mg | ORAL_CAPSULE | Freq: Every day | ORAL | 0 refills | Status: DC

## 2019-03-09 MED ORDER — LISDEXAMFETAMINE DIMESYLATE 70 MG PO CAPS: 70.0000 mg | ORAL_CAPSULE | ORAL | 0 refills | Status: DC

## 2019-03-09 NOTE — Progress Notes (Signed)
Established Patient Office Visit  Subjective:  Patient ID: Yolanda Mosley, female    DOB: 01-31-1981  Age: 38 y.o. MRN: [id number]  CC:  Chief Complaint  Patient presents with  . ADD    HPI [name] presents for  ADHD - Reports symptoms are well controlled on current regime. Denies any problems with insomnia, chest pain, palpitations, or SOB.  She finally got the Vyvanse and her sleep regulated.  She takes her Vyvanse at noon and then at bedtime she has been taking melatonin 5 mg and that actually seems to be working really well for her.  Hypertension- Pt denies chest pain, SOB, dizziness, or heart palpitations.  Taking meds as directed w/o problems.  Denies medication side effects.    Asthma-overall she is doing well.  She occasionally ends up using her albuterol at work because she feels like she cannot breathe as well when she has to wear a mask.  She works at [employer].  She will feel like she cannot breathe and then will start coughing and then will have to use her albuterol inhaler.  But outside of work she says she has not needed her albuterol at all.   Past Medical History:  Diagnosis Date  . Allergy   . Anemia   . Asthma   . 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 GERD (gastroesophageal reflux disease)   . Hemorrhoids    laser tx   . Herpes simplex virus (HSV) infection   . Hypertension   . Obesity   . Ovarian cyst    left  . Sleep apnea   . Tachycardia     Past Surgical History:  Procedure Laterality Date  . OMENTECTOMY  2005  . ovary, bilater fallopian tubes removed  2005  . SALPINGOOPHORECTOMY Right     Family History  Problem Relation Age of Onset  . Hypertension Mother   . Diabetes Other   . Other Other        cardiovascular disorder    Social History   Socioeconomic History  . Marital status: Single    Spouse name: Not on file  . Number of children: Not on file  . Years of education: Not on file  . Highest education level: Not on  file  Occupational History  . Not on file  Social Needs  . Financial resource strain: Not on file  . Food insecurity    Worry: Not on file    Inability: Not on file  . Transportation needs    Medical: Not on file    Non-medical: Not on file  Tobacco Use  . Smoking status: Never Smoker  . Smokeless tobacco: Never Used  Substance and Sexual Activity  . Alcohol use: No  . Drug use: No  . Sexual activity: Yes    Birth control/protection: None  Lifestyle  . Physical activity    Days per week: Not on file    Minutes per session: Not on file  . Stress: Not on file  Relationships  . Social Herbalist on phone: Not on file    Gets together: Not on file    Attends religious service: Not on file    Active member of club or organization: Not on file    Attends meetings of clubs or organizations: Not on file    Relationship status: Not on file  . Intimate partner violence    Fear of current or ex partner:  Not on file    Emotionally abused: Not on file    Physically abused: Not on file    Forced sexual activity: Not on file  Other Topics Concern  . Not on file  Social History Narrative  . Not on file    Outpatient Medications Prior to Visit  Medication Sig Dispense Refill  . Albuterol Sulfate (PROAIR RESPICLICK) 123XX123 (90 Base) MCG/ACT AEPB Inhale 2 puffs into the lungs every 8 (eight) hours as needed. 1 each 0  . atenolol (TENORMIN) 50 MG tablet Take 1 tablet (50 mg total) by mouth daily. 90 tablet 1  . benazepril-hydrochlorthiazide (LOTENSIN HCT) 10-12.5 MG tablet Take 1 tablet by mouth once daily 90 tablet 1  . [facility] ALLERGY RELIEF 180 MG tablet TAKE 1 TABLET (180 MG TOTAL) BY MOUTH DAILY. 90 tablet 0  . fluticasone (FLONASE) 50 MCG/ACT nasal spray Place 1-2 sprays into both nostrils daily. 16 g 2  . ibuprofen (ADVIL) 800 MG tablet Take 1 tablet (800 mg total) by mouth 3 (three) times daily. 90 tablet 0  . lisdexamfetamine (VYVANSE) 70 MG capsule Take 1 capsule (70 mg  total) by mouth every morning. 30 capsule 0  . lisdexamfetamine (VYVANSE) 70 MG capsule Take 1 capsule (70 mg total) by mouth daily. 30 capsule 0  . lisdexamfetamine (VYVANSE) 70 MG capsule Take 1 capsule (70 mg total) by mouth daily. 30 capsule 0  . valACYclovir (VALTREX) 1000 MG tablet Take 1 tablet (1,000 mg total) by mouth 2 (two) times daily. 20 tablet 0   No facility-administered medications prior to visit.     Allergies  Allergen Reactions  . Other Rash  . Hydrocodone Other (See Comments)    Dizzy , nauseous, swimmy headed.(Can take with phenergan)  . Keflex [Cephalexin]     Diarrhea and more abdominal cramping Diarrhea and more abdominal cramping  . Codeine Nausea Only  . Tape Rash    ROS Review of Systems    Objective:    Physical Exam  Constitutional: She is oriented to person, place, and time. She appears well-developed and well-nourished.  HENT:  Head: Normocephalic and atraumatic.  Cardiovascular: Normal rate, regular rhythm and normal heart sounds.  Pulmonary/Chest: Effort normal and breath sounds normal.  Neurological: She is alert and oriented to person, place, and time.  Skin: Skin is warm and dry.  Psychiatric: She has a normal mood and affect. Her behavior is normal.    BP 114/70   Pulse 71   Ht 5\' 4"  (1.626 m)   Wt 227 lb (103 kg)   SpO2 99%   BMI 38.96 kg/m  Wt Readings from Last 3 Encounters:  03/09/19 227 lb (103 kg)  02/05/19 227 lb (103 kg)  12/03/18 230 lb (104.3 kg)     There are no preventive care reminders to display for this patient.  There are no preventive care reminders to display for this patient.  Lab Results  Component Value Date   TSH 3.645 01/21/2014   Lab Results  Component Value Date   WBC 9.7 12/03/2018   HGB 14.5 12/03/2018   HCT 43.4 12/03/2018   MCV 85.8 12/03/2018   PLT 270 12/03/2018   Lab Results  Component Value Date   NA 140 12/08/2018   K 3.2 (L) 12/08/2018   CO2 33 (H) 12/08/2018   GLUCOSE 75  12/08/2018   BUN 23 12/08/2018   CREATININE 0.78 12/08/2018   BILITOT 0.4 06/10/2018   ALKPHOS 109 11/04/2015   AST 18 06/10/2018  ALT 19 06/10/2018   PROT 6.6 06/10/2018   ALBUMIN 4.0 11/04/2015   CALCIUM 9.6 12/08/2018   Lab Results  Component Value Date   CHOL 147 06/10/2018   Lab Results  Component Value Date   HDL 50 06/10/2018   Lab Results  Component Value Date   LDLCALC 79 06/10/2018   Lab Results  Component Value Date   TRIG 92 06/10/2018   Lab Results  Component Value Date   CHOLHDL 2.9 06/10/2018   No results found for: HGBA1C    Assessment & Plan:   Problem List Items Addressed This Visit      Cardiovascular and Mediastinum   HYPERTENSION, MILD (Chronic)    Well controlled. Continue current regimen. Follow up in  4 months.         Respiratory   Mild persistent asthma without complication    Stable.  Occasionally using albuterol at work when she feeling she cannot breathe after wearing a mask for her work shift.  Reassured her that she can breathe normally with the mask is not can cause drop in oxygen level.  She might even want to try paper mask versus fabric mask to see if she feels like she can breathe a little easier.        Other   ADHD - Primary    Happy with current regimen.  Refill sent for the next 4 months.  Follow-up in 4 months.  Heart exam normal.         Meds ordered this encounter  Medications  . lisdexamfetamine (VYVANSE) 70 MG capsule    Sig: Take 1 capsule (70 mg total) by mouth daily.    Dispense:  30 capsule    Refill:  0  . lisdexamfetamine (VYVANSE) 70 MG capsule    Sig: Take 1 capsule (70 mg total) by mouth every morning.    Dispense:  30 capsule    Refill:  0  . lisdexamfetamine (VYVANSE) 70 MG capsule    Sig: Take 1 capsule (70 mg total) by mouth daily.    Dispense:  30 capsule    Refill:  0  . lisdexamfetamine (VYVANSE) 70 MG capsule    Sig: Take 1 capsule (70 mg total) by mouth daily.    Dispense:  30  capsule    Refill:  0    Follow-up: Return in about 4 months (around 07/07/2019) for ADD medication and BP check up.    Beatrice Lecher, MD

## 2019-03-09 NOTE — Assessment & Plan Note (Signed)
Well controlled. Continue current regimen. Follow up in  4 months.   

## 2019-03-09 NOTE — Assessment & Plan Note (Addendum)
Stable.  Occasionally using albuterol at work when she feeling she cannot breathe after wearing a mask for her work shift.  Reassured her that she can breathe normally with the mask is not can cause drop in oxygen level.  She might even want to try paper mask versus fabric mask to see if she feels like she can breathe a little easier.

## 2019-03-09 NOTE — Assessment & Plan Note (Signed)
Happy with current regimen.  Refill sent for the next 4 months.  Follow-up in 4 months.  Heart exam normal.

## 2019-03-09 NOTE — Telephone Encounter (Signed)
[facility] called and states the maximum prescriptions in 1 day for controlled medication is 3 different prescriptions. The Vyvanse prescription for 06/27/18 will be cancelled. They state it can be resent tomorrow.

## 2019-03-20 ENCOUNTER — Other Ambulatory Visit: Payer: Self-pay | Admitting: Family Medicine

## 2019-05-14 ENCOUNTER — Encounter: Payer: Self-pay | Admitting: Family Medicine

## 2019-05-14 ENCOUNTER — Ambulatory Visit (INDEPENDENT_AMBULATORY_CARE_PROVIDER_SITE_OTHER): Payer: BC Managed Care – PPO | Admitting: Family Medicine

## 2019-05-14 VITALS — BP 139/91 | HR 79 | Ht 64.0 in

## 2019-05-14 DIAGNOSIS — I10 Essential (primary) hypertension: Secondary | ICD-10-CM

## 2019-05-14 DIAGNOSIS — J069 Acute upper respiratory infection, unspecified: Secondary | ICD-10-CM | POA: Diagnosis not present

## 2019-05-14 NOTE — Progress Notes (Signed)
She reports that her sxs began 5 days ago. She feels that she has improved somewhat. Her sxs are worse at night and get better during the daytime.  She has a sore throat and its been hard for her to swallow. She hasn't noticed any white spots or redness. She did state that she has post nasal drip. She also c/o bilateral ear pain/pressure, and has headache that is frontal and around her temples. She hasn't had much of a cough. She stated that she has had some center chest discomfort. Denies and SOB, f/s/c/n/v/d no loss of taste/smell and no body aches.   She did try some tylenol cold/flu she took 4 doses and this did not help he sxs.

## 2019-05-14 NOTE — Progress Notes (Signed)
Virtual Visit via Video Note  I connected with Selenne Cowsert on 05/14/19 at 10:10 AM EST by a video enabled telemedicine application and verified that I am speaking with the correct person using two identifiers.   I discussed the limitations of evaluation and management by telemedicine and the availability of in person appointments. The patient expressed understanding and agreed to proceed.  Subjective:    CC:URI sxs.    HPI: C/O runny nose, ore throat, bilateral ear pain, and mild cough x 55 days.  Worse in the morning and feels better at night.. No spots on her throat.  Chest hurts in the center but no SOB. No fever, sweats or chills.  Has been having frontal and bilateral temple headaches. No bodyaches.  Her niece and boyfriend had cold sxs as well both only sick for about 3 days and she is now not been feeling well for about 5 days..  Using Triaminic cold and Flu.  Reports that she has had a lot of drip and postnasal drainage.  Her BPs were high 2 days last week.   She says last Friday her blood pressure was 153/100 and it was very similar on Thursday this time she checked it was after work after she had had a really stressful day at work.  And then she noted this morning it was high as well.   Past medical history, Surgical history, Family history not pertinant except as noted below, Social history, Allergies, and medications have been entered into the medical record, reviewed, and corrections made.   Review of Systems: No fevers, chills, night sweats, weight loss, chest pain, or shortness of breath.   Objective:    General: Speaking clearly in complete sentences without any shortness of breath.  Alert and oriented x3.  Normal judgment. No apparent acute distress.    Impression and Recommendations:   Upper respiratory infection-we did discuss that it could possibly be Covid even though she does not have a fever body aches or loss of taste or smell but certainly could at least consider  that possibility.  Offered testing but she declined.  Discussed that this is most likely a viral illness and should resolve on its own.  Please give [place] call back if she feels like it is getting worse over the next couple of days.  Recommend symptomatic care.  Recommend a trial of DayQuil and NyQuil just for some symptom relief.    Hypertension-discussed options.  When blood pressure is high recommend now that she sit relax drink a glass of water and then recheck an hour.  At that point is staying persistently elevated above 150/100 and okay to take an extra 25 mg of atenolol.  Also just reminded her to avoid salt and work on stress reduction.  I discussed the assessment and treatment plan with the patient. The patient was provided an opportunity to ask questions and all were answered. The patient agreed with the plan and demonstrated an understanding of the instructions.   The patient was advised to call back or seek an in-person evaluation if the symptoms worsen or if the condition fails to improve as anticipated.   Beatrice Lecher, MD

## 2019-05-22 ENCOUNTER — Telehealth: Payer: Self-pay

## 2019-05-22 NOTE — Telephone Encounter (Signed)
Yolanda Mosley called and states her insurance now requires a PA for Vyvanse.

## 2019-05-27 NOTE — Telephone Encounter (Signed)
Patient called to check the status and I let her know that I was working on this as quickly as possible. She did not have any questions.

## 2019-05-29 NOTE — Telephone Encounter (Signed)
I have sent information to insurance and waiting on a response.

## 2019-05-29 NOTE — Telephone Encounter (Signed)
Approved  Today (Vyvanse)  Request Reference Number: [id number]. VYVANSE CAP 70MG  is approved through 05/28/2020. Your patient may now fill this prescription and it will be covered. Pharmacy aware and Charleston Ropes took the call. No other questions.

## 2019-06-18 ENCOUNTER — Other Ambulatory Visit: Payer: Self-pay | Admitting: Family Medicine

## 2019-06-18 DIAGNOSIS — I10 Essential (primary) hypertension: Secondary | ICD-10-CM

## 2019-06-18 MED ORDER — BENAZEPRIL-HYDROCHLOROTHIAZIDE 20-12.5 MG PO TABS: 1.0000 | ORAL_TABLET | Freq: Every day | ORAL | 0 refills | Status: DC

## 2019-06-19 ENCOUNTER — Encounter: Payer: Self-pay | Admitting: Family Medicine

## 2019-06-19 ENCOUNTER — Telehealth: Payer: Self-pay

## 2019-06-19 NOTE — Telephone Encounter (Signed)
Yolanda Mosley called and stated when she picked up her prescription that she asked to be called in it was for a higher dose and no one had mentioned to her that her dose was changing. She wanted someone to call her to let her know if it is correct or if the wrong medication was called in. Phone [phone]

## 2019-06-19 NOTE — Telephone Encounter (Signed)
Dr Jerilynn Mages, looks like you sent in the last refill. I found this in her last office note: "Hypertension-discussed options.  When blood pressure is high recommend now that she sit relax drink a glass of water and then recheck an hour.  At that point is staying persistently elevated above 150/100 and okay to take an extra 25 mg of atenolol.  Also just reminded her to avoid salt and work on stress reduction."  Please advise

## 2019-06-19 NOTE — Telephone Encounter (Signed)
Her blood pressure was still high when she went to the ED about 2 weeks ago so can increase her benazepril dose.

## 2019-06-19 NOTE — Telephone Encounter (Signed)
See first message.

## 2019-06-19 NOTE — Telephone Encounter (Signed)
Yolanda Mosley called to confirm the dose of the benazepril - HCTZ. She is also taking half a tablet of Atenolol 50 mg once daily unless her blood pressure is over 150/90 then she takes the other half. Advised her to continue taking half the Atenolol 50 mg and the current dose of the Benazepril - HCTZ 20-12.5, per Dr Madilyn Fireman. I advised her to check her blood pressure daily for a few days and call us next week with the values.

## 2019-06-22 NOTE — Telephone Encounter (Signed)
This has been addressed with patient.  

## 2019-06-22 NOTE — Telephone Encounter (Signed)
Patient advised on this. See phone note with Levada Dy

## 2019-06-22 NOTE — Telephone Encounter (Signed)
Yes, I want her to go up on her dose.  When I look back the last several times she is been here her blood pressures been high.  So please take the benazepril HCTZ 20/12.5.  This was I think also addressed in a separate message but may need to just double check.

## 2019-06-30 ENCOUNTER — Other Ambulatory Visit: Payer: Self-pay

## 2019-06-30 NOTE — Telephone Encounter (Signed)
Yolanda Mosley called for a refill on Vyvanse. She has one more refill. The pharmacy is getting it ready for pick up. Patient has schedule a follow up for Friday.

## 2019-07-03 ENCOUNTER — Encounter: Payer: Self-pay | Admitting: Family Medicine

## 2019-07-03 ENCOUNTER — Telehealth (INDEPENDENT_AMBULATORY_CARE_PROVIDER_SITE_OTHER): Payer: BC Managed Care – PPO | Admitting: Family Medicine

## 2019-07-03 VITALS — BP 122/88 | HR 73 | Ht 64.0 in | Wt 236.0 lb

## 2019-07-03 DIAGNOSIS — F902 Attention-deficit hyperactivity disorder, combined type: Secondary | ICD-10-CM | POA: Diagnosis not present

## 2019-07-03 DIAGNOSIS — I10 Essential (primary) hypertension: Secondary | ICD-10-CM

## 2019-07-03 DIAGNOSIS — R0789 Other chest pain: Secondary | ICD-10-CM

## 2019-07-03 MED ORDER — LISDEXAMFETAMINE DIMESYLATE 70 MG PO CAPS: 70.0000 mg | ORAL_CAPSULE | Freq: Every day | ORAL | 0 refills | Status: DC

## 2019-07-03 MED ORDER — LISDEXAMFETAMINE DIMESYLATE 70 MG PO CAPS: 70.0000 mg | ORAL_CAPSULE | ORAL | 0 refills | Status: DC

## 2019-07-03 NOTE — Assessment & Plan Note (Signed)
Well-controlled.  Continue current regimen.  We will need to just continue to monitor her blood pressure and the chest pain situation.  She has been on the stimulant with no recent changes in a very long time.  Plan to follow back up in 4 months.  Refills sent.

## 2019-07-03 NOTE — Progress Notes (Signed)
Virtual Visit via Video Note  I connected with Yolanda Mosley on 07/03/19 at  1:40 PM EST by a video enabled telemedicine application and verified that I am speaking with the correct person using two identifiers.   I discussed the limitations of evaluation and management by telemedicine and the availability of in person appointments. The patient expressed understanding and agreed to proceed.  Subjective:    CC: F/U BPs   HPI:  Hypertension- Pt denies chest pain, SOB, dizziness, or heart palpitations.  Taking meds as directed w/o problems.  Denies medication side effects.  BP have been in the 120s on the medication.    She hasn't had anymore more chest pain since the ED visit.  Went to the ED on 2/5 for uncontrolled HTN, chest pain and headaches.  Feels like work has been really stressful.    ADHD - Reports symptoms are well controlled on current regime. Denies any problems with insomnia,  palpitations, or SOB.     Past medical history, Surgical history, Family history not pertinant except as noted below, Social history, Allergies, and medications have been entered into the medical record, reviewed, and corrections made.   Review of Systems: No fevers, chills, night sweats, weight loss, chest pain, or shortness of breath.   Objective:    General: Speaking clearly in complete sentences without any shortness of breath.  Alert and oriented x3.  Normal judgment. No apparent acute distress.    Impression and Recommendations:    HYPERTENSION, MILD Blood pressure looks great on recent increase in medications and will just continue to monitor she is been on the new dose about a week and so far is tolerating it well.  We do need to get up-to-date blood work on need to check her creatinine and potassium.  Otherwise we will plan to follow back up in about 4 months.  ADHD Well-controlled.  Continue current regimen.  We will need to just continue to monitor her blood pressure and the chest pain  situation.  She has been on the stimulant with no recent changes in a very long time.  Plan to follow back up in 4 months.  Refills sent.   Atypical chest pain-seems to have resolved.  She really feels like it was likely stress related again work has been really stressful for her.  We just discussed maybe some ways to decompress and really center herself and sometimes even just up away from situation if need be.    Time spent in encounter 30 minutes including reviewing emergency department notes.  I discussed the assessment and treatment plan with the patient. The patient was provided an opportunity to ask questions and all were answered. The patient agreed with the plan and demonstrated an understanding of the instructions.   The patient was advised to call back or seek an in-person evaluation if the symptoms worsen or if the condition fails to improve as anticipated.   Beatrice Lecher, MD

## 2019-07-03 NOTE — Progress Notes (Signed)
Doing well on current regimen. Denies CP,SOB, and insomnia.

## 2019-07-03 NOTE — Assessment & Plan Note (Signed)
Blood pressure looks great on recent increase in medications and will just continue to monitor she is been on the new dose about a week and so far is tolerating it well.  We do need to get up-to-date blood work on need to check her creatinine and potassium.  Otherwise we will plan to follow back up in about 4 months.

## 2019-09-10 ENCOUNTER — Other Ambulatory Visit: Payer: Self-pay | Admitting: Family Medicine

## 2019-10-01 ENCOUNTER — Other Ambulatory Visit: Payer: Self-pay | Admitting: Family Medicine

## 2019-10-01 ENCOUNTER — Encounter: Payer: Self-pay | Admitting: Family Medicine

## 2019-10-26 ENCOUNTER — Other Ambulatory Visit: Payer: Self-pay | Admitting: Family Medicine

## 2019-10-26 DIAGNOSIS — I10 Essential (primary) hypertension: Secondary | ICD-10-CM

## 2019-11-09 ENCOUNTER — Other Ambulatory Visit: Payer: Self-pay

## 2019-11-09 DIAGNOSIS — F902 Attention-deficit hyperactivity disorder, combined type: Secondary | ICD-10-CM

## 2019-11-09 MED ORDER — LISDEXAMFETAMINE DIMESYLATE 70 MG PO CAPS: 70.0000 mg | ORAL_CAPSULE | Freq: Every day | ORAL | 0 refills | Status: DC

## 2019-11-09 NOTE — Telephone Encounter (Signed)
Patient called for refill on Vyvanse  Last RX sent 09/30/19  Last OV 07/03/19  RX pended. Patient going out to town Thursday, has appt scheduled for 11/19/19 for ADHD  Please send RX if OK. Patient aware she MUST keep appt

## 2019-11-19 ENCOUNTER — Encounter: Payer: Self-pay | Admitting: Family Medicine

## 2019-11-19 ENCOUNTER — Ambulatory Visit (INDEPENDENT_AMBULATORY_CARE_PROVIDER_SITE_OTHER): Payer: BC Managed Care – PPO | Admitting: Family Medicine

## 2019-11-19 ENCOUNTER — Other Ambulatory Visit: Payer: Self-pay

## 2019-11-19 VITALS — BP 116/66 | HR 71 | Ht 64.0 in | Wt 235.0 lb

## 2019-11-19 DIAGNOSIS — M545 Low back pain, unspecified: Secondary | ICD-10-CM

## 2019-11-19 DIAGNOSIS — F902 Attention-deficit hyperactivity disorder, combined type: Secondary | ICD-10-CM | POA: Diagnosis not present

## 2019-11-19 DIAGNOSIS — I10 Essential (primary) hypertension: Secondary | ICD-10-CM | POA: Diagnosis not present

## 2019-11-19 MED ORDER — LISDEXAMFETAMINE DIMESYLATE 70 MG PO CAPS: 70.0000 mg | ORAL_CAPSULE | Freq: Every day | ORAL | 0 refills | Status: DC

## 2019-11-19 NOTE — Assessment & Plan Note (Signed)
Blood pressure looks absolutely fantastic today.  Continue current regimen.  She is due for blood work in August.

## 2019-11-19 NOTE — Progress Notes (Signed)
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 She reports that the pain is 6/10, and the pain is burning. She has used aleve, muscle relaxer, and ice.

## 2019-11-19 NOTE — Progress Notes (Signed)
Established Patient Office Visit  Subjective:  Patient ID: Yolanda Mosley, female    DOB: [birth year]  Age: 39 y.o. MRN: [id number]  CC:  Chief Complaint  Patient presents with  . ADD  . Back Pain    x1 week    HPI Yolanda Mosley presents for acute bilateral low back pain that started about a week ago.  She was sitting in the floor for about 10 minutes and started to get some discomfort she got up and says her back was sore for the rest of the day.  The next day it actually felt better but then the following day it started back and has bothered her ever since.  She had some old methocarbamol that Dr. Maxie Better had given her and so she has been taking that.  She says the pain occasionally radiates up and down the spine but does not radiate down into the legs.  She has tried ice, ibuprofen, the muscle relaxer, and Tiger balm.  The only thing that provides some minimal relief is the muscle relaxer.  He said she had similar pain a few years ago and actually did physical therapy at the time.  ADD - Reports symptoms are well controlled on current regime. Denies any problems with insomnia, chest pain, palpitations, or SOB.     Past Medical History:  Diagnosis Date  . Allergy   . Anemia   . Asthma   . 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 GERD (gastroesophageal reflux disease)   . Hemorrhoids    laser tx   . Herpes simplex virus (HSV) infection   . Hypertension   . Obesity   . Ovarian cyst    left  . Sleep apnea   . Tachycardia     Past Surgical History:  Procedure Laterality Date  . OMENTECTOMY  2005  . ovary, bilater fallopian tubes removed  2005  . SALPINGOOPHORECTOMY Right     Family History  Problem Relation Age of Onset  . Hypertension Mother   . Diabetes Other   . Other Other        cardiovascular disorder    Social History   Socioeconomic History  . Marital status: Single    Spouse name: Not on file  . Number of children: Not on file  . Years of education:  Not on file  . Highest education level: Not on file  Occupational History  . Not on file  Tobacco Use  . Smoking status: Never Smoker  . Smokeless tobacco: Never Used  Substance and Sexual Activity  . Alcohol use: No  . Drug use: No  . Sexual activity: Yes    Birth control/protection: None  Other Topics Concern  . Not on file  Social History Narrative  . Not on file   Social Determinants of Health   Financial Resource Strain:   . Difficulty of Paying Living Expenses:   Food Insecurity:   . Worried About Charity fundraiser in the Last Year:   . Arboriculturist in the Last Year:   Transportation Needs:   . Film/video editor (Medical):   Marland Kitchen Lack of Transportation (Non-Medical):   Physical Activity:   . Days of Exercise per Week:   . Minutes of Exercise per Session:   Stress:   . Feeling of Stress :   Social Connections:   . Frequency of Communication with Friends and Family:   . Frequency of Social Gatherings  with Friends and Family:   . Attends Religious Services:   . Active Member of Clubs or Organizations:   . Attends Archivist Meetings:   Marland Kitchen Marital Status:   Intimate Partner Violence:   . Fear of Current or Ex-Partner:   . Emotionally Abused:   Marland Kitchen Physically Abused:   . Sexually Abused:     Outpatient Medications Prior to Visit  Medication Sig Dispense Refill  . Albuterol Sulfate (PROAIR RESPICLICK) 270 (90 Base) MCG/ACT AEPB Inhale 2 puffs into the lungs every 8 (eight) hours as needed. 1 each 0  . atenolol (TENORMIN) 50 MG tablet Take 1 tablet by mouth once daily 90 tablet 0  . benazepril-hydrochlorthiazide (LOTENSIN HCT) 20-12.5 MG tablet Take 1 tablet by mouth once daily 90 tablet 0  . [facility] ALLERGY RELIEF 180 MG tablet TAKE 1 TABLET (180 MG TOTAL) BY MOUTH DAILY. 90 tablet 0  . valACYclovir (VALTREX) 1000 MG tablet Take 1 tablet by mouth twice daily 20 tablet 0  . lisdexamfetamine (VYVANSE) 70 MG capsule Take 1 capsule (70 mg total) by mouth  daily. 30 capsule 0   No facility-administered medications prior to visit.    Allergies  Allergen Reactions  . Other Rash  . Hydrocodone Other (See Comments)    Dizzy , nauseous, swimmy headed.(Can take with phenergan)  . Keflex [Cephalexin]     Diarrhea and more abdominal cramping Diarrhea and more abdominal cramping  . Codeine Nausea Only  . Tape Rash    ROS Review of Systems    Objective:    Physical Exam Vitals reviewed.  Constitutional:      Appearance: She is well-developed.  HENT:     Head: Normocephalic and atraumatic.  Eyes:     Conjunctiva/sclera: Conjunctivae normal.  Cardiovascular:     Rate and Rhythm: Normal rate.  Pulmonary:     Effort: Pulmonary effort is normal.  Musculoskeletal:     Comments: Nontender over the thoracic or lumbar spine.  She is slightly tender just above the iliac crest bilaterally and a little bit over the left SI joint.  Normal flexion, extension, rotation.  She had pain with side bending to the left.  Normal to the right.  Negative straight leg raise.  Skin:    General: Skin is dry.     Coloration: Skin is not pale.  Neurological:     Mental Status: She is alert and oriented to person, place, and time.  Psychiatric:        Behavior: Behavior normal.     BP 116/66   Pulse 71   Ht 5\' 4"  (1.626 m)   Wt 235 lb (106.6 kg)   SpO2 100%   BMI 40.34 kg/m  Wt Readings from Last 3 Encounters:  11/19/19 235 lb (106.6 kg)  07/03/19 236 lb (107 kg)  03/09/19 227 lb (103 kg)     There are no preventive care reminders to display for this patient.  There are no preventive care reminders to display for this patient.  Lab Results  Component Value Date   TSH 3.645 01/21/2014   Lab Results  Component Value Date   WBC 9.7 12/03/2018   HGB 14.5 12/03/2018   HCT 43.4 12/03/2018   MCV 85.8 12/03/2018   PLT 270 12/03/2018   Lab Results  Component Value Date   NA 140 12/08/2018   K 3.2 (L) 12/08/2018   CO2 33 (H) 12/08/2018    GLUCOSE 75 12/08/2018   BUN 23 12/08/2018  CREATININE 0.78 12/08/2018   BILITOT 0.4 06/10/2018   ALKPHOS 109 11/04/2015   AST 18 06/10/2018   ALT 19 06/10/2018   PROT 6.6 06/10/2018   ALBUMIN 4.0 11/04/2015   CALCIUM 9.6 12/08/2018   Lab Results  Component Value Date   CHOL 147 06/10/2018   Lab Results  Component Value Date   HDL 50 06/10/2018   Lab Results  Component Value Date   LDLCALC 79 06/10/2018   Lab Results  Component Value Date   TRIG 92 06/10/2018   Lab Results  Component Value Date   CHOLHDL 2.9 06/10/2018   No results found for: HGBA1C    Assessment & Plan:   Problem List Items Addressed This Visit      Cardiovascular and Mediastinum   HYPERTENSION, MILD (Chronic)    Blood pressure looks absolutely fantastic today.  Continue current regimen.  She is due for blood work in August.      Relevant Orders   COMPLETE METABOLIC PANEL WITH GFR   Lipid panel     Other   Bilateral low back pain without sciatica   Relevant Orders   Ambulatory referral to Physical Therapy   ADHD - Primary    Well controlled. Continue current regimen. Follow up in  4 months.        Relevant Medications   lisdexamfetamine (VYVANSE) 70 MG capsule (Start on 12/09/2019)     Bilateral low back pain-we will treat conservatively.  Okay to continue oral anti-inflammatory.  She still has some methocarbamol at home if she needs a refill we can certainly send that over she will let us know in the meantime given handout to do some home stretches over the weekend we will go ahead and refer to formal physical therapy.   Meds ordered this encounter  Medications  . lisdexamfetamine (VYVANSE) 70 MG capsule    Sig: Take 1 capsule (70 mg total) by mouth daily.    Dispense:  30 capsule    Refill:  0  . lisdexamfetamine (VYVANSE) 70 MG capsule    Sig: Take 1 capsule (70 mg total) by mouth daily before breakfast.    Dispense:  30 capsule    Refill:  0  . lisdexamfetamine (VYVANSE)  70 MG capsule    Sig: Take 1 capsule (70 mg total) by mouth daily before breakfast.    Dispense:  30 capsule    Refill:  0  . lisdexamfetamine (VYVANSE) 70 MG capsule    Sig: Take 1 capsule (70 mg total) by mouth daily before breakfast.    Dispense:  30 capsule    Refill:  0    Follow-up: Return in about 4 months (around 03/21/2020) for ADHD.    Beatrice Lecher, MD

## 2019-11-19 NOTE — Assessment & Plan Note (Signed)
Well controlled. Continue current regimen. Follow up in  4 months.   

## 2019-11-24 ENCOUNTER — Telehealth (INDEPENDENT_AMBULATORY_CARE_PROVIDER_SITE_OTHER): Payer: BC Managed Care – PPO | Admitting: Family Medicine

## 2019-11-24 ENCOUNTER — Encounter: Payer: Self-pay | Admitting: Family Medicine

## 2019-11-24 VITALS — Temp 96.0°F | Ht 64.0 in | Wt 235.0 lb

## 2019-11-24 DIAGNOSIS — J069 Acute upper respiratory infection, unspecified: Secondary | ICD-10-CM

## 2019-11-24 NOTE — Progress Notes (Signed)
Virtual Visit via Video Note  I connected with Yolanda Mosley on 11/24/19 at  4:20 PM EDT by a video enabled telemedicine application and verified that I am speaking with the correct person using two identifiers.   I discussed the limitations of evaluation and management by telemedicine and the availability of in person appointments. The patient expressed understanding and agreed to proceed.  Patient location: Provider location: in office  Subjective:    CC: Upper respiratory symptoms/worsening asthma  HPI: Symptoms initially started on Saturday approximately 4 days ago on  Sore throat and cough   Sunday started:  Post nasal drip drainage SOB   Still having sore throat, cough, drainage, SOB (with talking) Getting worse everyday  No fevers, chills, body aches No nausea, vomiting, diarrhea No loss of taste/smell No ear pain.  Throat still feels very sore.  Taking dayquil - helped some, but not much, only took once No other OTC meds (nasal sprays, mucinex).  She works in Radiation protection practitioner at [employer].  She has not been vaccinated against Covid.  She has not had Covid this year.   Past medical history, Surgical history, Family history not pertinant except as noted below, Social history, Allergies, and medications have been entered into the medical record, reviewed, and corrections made.   Review of Systems: No fevers, chills, night sweats, weight loss, chest pain, or shortness of breath.   Objective:    General: Speaking clearly in complete sentences without any shortness of breath.  Alert and oriented x3.  Normal judgment. No apparent acute distress.    Impression and Recommendations:    No problem-specific Assessment & Plan notes found for this encounter.  Upper respiratory infection-likely viral based on her description of symptoms.  I am concerned about the possibility of Covid as she does work in [employer] though she has been wearing her mask.  She has not had anyone sick  in the home.  Did encourage her to get tested into quarantine until she knows the results for sure if we need to provide a work note were happy to do so offered to do PCR testing here that will take 2 days to come back where she can opt to do some rapid testing [facility].  Explained to her how to go online to do that.  In the meantime I want her to use her albuterol liberally because she does have underlying asthma.  She does not have a home pulse oximeter but we discussed possibly getting 1 so that she can monitor her oxygen level especially if she does have Covid.  Could consider prednisone if she is negative for Covid to help with the underlying asthma.    Time spent in encounter 18 minutes  I discussed the assessment and treatment plan with the patient. The patient was provided an opportunity to ask questions and all were answered. The patient agreed with the plan and demonstrated an understanding of the instructions.   The patient was advised to call back or seek an in-person evaluation if the symptoms worsen or if the condition fails to improve as anticipated.   Beatrice Lecher, MD

## 2019-11-24 NOTE — Progress Notes (Signed)
Started Saturday:  Sore throat Cough   Sunday started:  Drainage SOB   Still having sore throat, cough, drainage, SOB (with talking) Getting worse everyday  No fevers, chills, body aches No nausea, vomiting, diarrhea No loss of taste/smell  Taking dayquil - helped some, but not much, only took once No other OTC meds (nasal sprays, mucinex)

## 2019-11-25 ENCOUNTER — Telehealth: Payer: Self-pay

## 2019-11-25 ENCOUNTER — Ambulatory Visit: Payer: BC Managed Care – PPO | Admitting: Physical Therapy

## 2019-11-25 MED ORDER — PREDNISONE 20 MG PO TABS: 40.0000 mg | ORAL_TABLET | Freq: Every day | ORAL | 0 refills | Status: DC

## 2019-11-25 NOTE — Telephone Encounter (Signed)
Delain[facility] called and left a message stating the strep and Covid came back negative.

## 2019-11-25 NOTE — Telephone Encounter (Signed)
Task completed. Pt has been updated and is aware prednisone rx sent to the pharmacy. No other inquiries during the call.

## 2019-11-25 NOTE — Telephone Encounter (Signed)
prednisonesent

## 2019-11-25 NOTE — Telephone Encounter (Signed)
Pt called stating that she was tested for Covid and Strep. Both tests were negative. Pt was informed to take Sudafed. She is also requesting if provider will be sending in a rx for prednisone. Pls advise, thanks.

## 2019-11-27 ENCOUNTER — Encounter: Payer: Self-pay | Admitting: Family Medicine

## 2019-11-30 MED ORDER — AZITHROMYCIN 250 MG PO TABS: ORAL_TABLET | ORAL | 0 refills | Status: AC

## 2019-11-30 NOTE — Telephone Encounter (Signed)
ABS sent to pharmacy

## 2019-11-30 NOTE — Telephone Encounter (Signed)
Patient called wanting to know next steps. Still has cough, discolored mucous, runny nose, and feeling bad. She has taken Prednisone and Sudafed as well as used some coughing pearls she already had at home  Please advise. Had video visit on 11/24/19

## 2019-12-14 ENCOUNTER — Other Ambulatory Visit: Payer: Self-pay | Admitting: Family Medicine

## 2019-12-14 ENCOUNTER — Other Ambulatory Visit: Payer: Self-pay

## 2019-12-14 NOTE — Telephone Encounter (Signed)
This doesn't make since. That is their problem , not mine.

## 2019-12-14 NOTE — Telephone Encounter (Signed)
The pharmacy was unable to put the August Vyvanse prescription on the system. They will need another prescription.

## 2019-12-15 ENCOUNTER — Other Ambulatory Visit: Payer: Self-pay | Admitting: Family Medicine

## 2019-12-15 DIAGNOSIS — F902 Attention-deficit hyperactivity disorder, combined type: Secondary | ICD-10-CM

## 2019-12-15 MED ORDER — LISDEXAMFETAMINE DIMESYLATE 70 MG PO CAPS: 70.0000 mg | ORAL_CAPSULE | Freq: Every day | ORAL | 0 refills | Status: DC

## 2019-12-15 NOTE — Telephone Encounter (Signed)
I know this is crazy. I spoke with the pharmacy myself. They can't store prescriptions for more than a total of 90 days. So the last prescription for August was never received in their system. [facility] still needs the prescription for August.

## 2020-02-05 ENCOUNTER — Other Ambulatory Visit: Payer: Self-pay

## 2020-02-05 ENCOUNTER — Encounter: Payer: Self-pay | Admitting: Medical-Surgical

## 2020-02-05 ENCOUNTER — Ambulatory Visit (INDEPENDENT_AMBULATORY_CARE_PROVIDER_SITE_OTHER): Payer: BC Managed Care – PPO | Admitting: Medical-Surgical

## 2020-02-05 VITALS — BP 136/84 | HR 77 | Temp 98.0°F | Ht 64.0 in | Wt 230.8 lb

## 2020-02-05 DIAGNOSIS — R791 Abnormal coagulation profile: Secondary | ICD-10-CM

## 2020-02-05 LAB — CP4508-PT/INR AND PTT
INR: 1
Prothrombin Time: 10.3 s (ref 9.0–11.5)
aPTT: 26 s (ref 23–32)

## 2020-02-05 LAB — CBC
HCT: 41.5 % (ref 35.0–45.0)
Hemoglobin: 14.1 g/dL (ref 11.7–15.5)
MCH: 28.8 pg (ref 27.0–33.0)
MCHC: 34 g/dL (ref 32.0–36.0)
MCV: 84.9 fL (ref 80.0–100.0)
MPV: 11.7 fL (ref 7.5–12.5)
Platelets: 268 10*3/uL (ref 140–400)
RBC: 4.89 10*6/uL (ref 3.80–5.10)
RDW: 12.9 % (ref 11.0–15.0)
WBC: 11.2 10*3/uL — ABNORMAL HIGH (ref 3.8–10.8)

## 2020-02-05 NOTE — Progress Notes (Signed)
Subjective:    CC: Prolonged bleeding  HPI: Pleasant 39 year old female presenting today for evaluation of prolonged bleeding.  She went to the dentist yesterday and had a tooth pulled that was broken.  Notes they had trouble getting it out and afterwards it bled more than usual.  She was sent home with gauze to change the packing.  After an hour or so, she had change the gauze 4 times due to saturation and had to use 2 towels to catch the blood draining from her mouth.  She returned to the dentist office where they placed a suture in the cavity and instructed her to hold pressure for 45 minutes.  She was able to do this and then shortly after fell asleep.  When she woke up she was still bleeding.  She used a teabag as the dentist had instructed but this was unhelpful.  She ended up going back to the dentist where they put in more stitches with mesh packing.  They instructed her to hold tight pressure for 2 hours.  This was slightly more successful but she noted that she still had some oozing last night from the area.  Her dentist advised her to see her PCP as this excessive bleeding after a tooth extraction was not normal.  No history of bleeding disorder.  Denies excessive bleeding when she cuts herself or injures herself.  She has had some bloody discharge from her nose yesterday and today but no history of frequent nosebleeds.  Irregular periods that alter in flow from light to heavy but no unusual menses.  She did have surgery for cancer at a very young age but does not remember any excessive bleeding with that.  Endorses scattered spontaneous bruising but does not have any present today.  Notes that she has not been taking Goody powders but she has been using ibuprofen regularly for tooth pain over the past 2 weeks.  Her last dose was approximately 4-5 days ago.  Denies fever, chills, shortness of breath, and chest pain.  I reviewed the past medical history, family history, social history, surgical  history, and allergies today and no changes were needed.  Please see the problem list section below in epic for further details.  Past Medical History: Past Medical History:  Diagnosis Date  . Allergy   . Anemia   . Asthma   . 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 GERD (gastroesophageal reflux disease)   . Hemorrhoids    laser tx   . Herpes simplex virus (HSV) infection   . Hypertension   . Obesity   . Ovarian cyst    left  . Sleep apnea   . Tachycardia    Past Surgical History: Past Surgical History:  Procedure Laterality Date  . OMENTECTOMY  2005  . ovary, bilater fallopian tubes removed  2005  . SALPINGOOPHORECTOMY Right    Social History: Social History   Socioeconomic History  . Marital status: Single    Spouse name: Not on file  . Number of children: Not on file  . Years of education: Not on file  . Highest education level: Not on file  Occupational History  . Not on file  Tobacco Use  . Smoking status: Never Smoker  . Smokeless tobacco: Never Used  Substance and Sexual Activity  . Alcohol use: No  . Drug use: No  . Sexual activity: Yes    Birth control/protection: None  Other Topics Concern  .  Not on file  Social History Narrative  . Not on file   Social Determinants of Health   Financial Resource Strain:   . Difficulty of Paying Living Expenses: Not on file  Food Insecurity:   . Worried About Charity fundraiser in the Last Year: Not on file  . Ran Out of Food in the Last Year: Not on file  Transportation Needs:   . Lack of Transportation (Medical): Not on file  . Lack of Transportation (Non-Medical): Not on file  Physical Activity:   . Days of Exercise per Week: Not on file  . Minutes of Exercise per Session: Not on file  Stress:   . Feeling of Stress : Not on file  Social Connections:   . Frequency of Communication with Friends and Family: Not on file  . Frequency of Social Gatherings with Friends and Family: Not on file  .  Attends Religious Services: Not on file  . Active Member of Clubs or Organizations: Not on file  . Attends Archivist Meetings: Not on file  . Marital Status: Not on file   Family History: Family History  Problem Relation Age of Onset  . Hypertension Mother   . Diabetes Other   . Other Other        cardiovascular disorder   Allergies: Allergies  Allergen Reactions  . Other Rash  . Hydrocodone Other (See Comments)    Dizzy , nauseous, swimmy headed.(Can take with phenergan)  . Keflex [Cephalexin]     Diarrhea and more abdominal cramping Diarrhea and more abdominal cramping  . Codeine Nausea Only  . Tape Rash   Medications: See med rec.  Review of Systems: See HPI for pertinent positives and negatives.   Objective:    General: Well Developed, well nourished, and in no acute distress.  Neuro: Alert and oriented x3.  HEENT: Normocephalic, atraumatic.  Skin: Warm and dry. Cardiac: Regular rate and rhythm, no murmurs rubs or gallops, no lower extremity edema.  Respiratory: Clear to auscultation bilaterally. Not using accessory muscles, speaking in full sentences.  Impression and Recommendations:    1. Prolonged bleeding time Will go ahead and check CBC due to the volume of blood loss.  Checking PT, PTT, and INR.  Bleeding has stopped today so she should continue to monitor.  Okay to go to work but avoid lifting greater than 10 pounds and bending at the waist.  Letter provided with these restrictions.  Suspect frequent dosing with ibuprofen over the last couple of weeks is the culprit for her extended bleeding time but we will check clotting times to make sure there are no undetected abnormalities. - CBC - [id number]/INR AND PTT  Return if symptoms worsen or fail to improve.   ___________________________________________ Clearnce Sorrel, DNP, APRN, FNP-BC [facility] [place]

## 2020-02-16 ENCOUNTER — Encounter: Payer: Self-pay | Admitting: Medical-Surgical

## 2020-02-16 ENCOUNTER — Other Ambulatory Visit: Payer: Self-pay

## 2020-02-16 ENCOUNTER — Ambulatory Visit (INDEPENDENT_AMBULATORY_CARE_PROVIDER_SITE_OTHER): Payer: BC Managed Care – PPO | Admitting: Medical-Surgical

## 2020-02-16 VITALS — BP 127/86 | HR 79 | Temp 97.5°F | Ht 64.0 in | Wt 229.9 lb

## 2020-02-16 DIAGNOSIS — H9201 Otalgia, right ear: Secondary | ICD-10-CM

## 2020-02-16 DIAGNOSIS — R0982 Postnasal drip: Secondary | ICD-10-CM | POA: Diagnosis not present

## 2020-02-16 DIAGNOSIS — L723 Sebaceous cyst: Secondary | ICD-10-CM

## 2020-02-16 NOTE — Progress Notes (Signed)
Subjective:    CC: Sebaceous cyst  HPI: Pleasant 39 year old female presenting today with complaints of a returning sebaceous cyst.  She was evaluated in 02/2018 and determined to have a sebaceous cyst on her right eyebrow.  She was referred at that time to plastic surgery but waited until July 2020 to have this removed.  Today she presents with reports that she feels like the cyst is starting to come back and she does not want to wait until it gets large to have it addressed again.  She is requesting a referral to plastic surgery, either the same one or someone closer to [place].  She is doing well after her dental care but does endorse some continued right-sided lower jaw pain.  She does have another tooth that has a hole in it and will need further procedures to have that repaired.   Has a history of significant allergies. She notes increased postnasal drip for the last several days.  She did have a sore throat a few days ago and today notes that her right ear, right throat, and right neck are all somewhat painful.  Has been taking allergy relief medication and notes that she has taken a dose or two of a decongestant to help with the drainage.  Denies fevers, chills, purulent drainage, cough, chest congestion, and shortness of breath.  I reviewed the past medical history, family history, social history, surgical history, and allergies today and no changes were needed.  Please see the problem list section below in epic for further details.  Past Medical History: Past Medical History:  Diagnosis Date  . Allergy   . Anemia   . Asthma   . 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 GERD (gastroesophageal reflux disease)   . Hemorrhoids    laser tx   . Herpes simplex virus (HSV) infection   . Hypertension   . Obesity   . Ovarian cyst    left  . Sleep apnea   . Tachycardia    Past Surgical History: Past Surgical History:  Procedure Laterality Date  . OMENTECTOMY  2005  .  ovary, bilater fallopian tubes removed  2005  . SALPINGOOPHORECTOMY Right    Social History: Social History   Socioeconomic History  . Marital status: Single    Spouse name: Not on file  . Number of children: Not on file  . Years of education: Not on file  . Highest education level: Not on file  Occupational History  . Not on file  Tobacco Use  . Smoking status: Never Smoker  . Smokeless tobacco: Never Used  Substance and Sexual Activity  . Alcohol use: No  . Drug use: No  . Sexual activity: Yes    Birth control/protection: None  Other Topics Concern  . Not on file  Social History Narrative  . Not on file   Social Determinants of Health   Financial Resource Strain:   . Difficulty of Paying Living Expenses: Not on file  Food Insecurity:   . Worried About Charity fundraiser in the Last Year: Not on file  . Ran Out of Food in the Last Year: Not on file  Transportation Needs:   . Lack of Transportation (Medical): Not on file  . Lack of Transportation (Non-Medical): Not on file  Physical Activity:   . Days of Exercise per Week: Not on file  . Minutes of Exercise per Session: Not on file  Stress:   .  Feeling of Stress : Not on file  Social Connections:   . Frequency of Communication with Friends and Family: Not on file  . Frequency of Social Gatherings with Friends and Family: Not on file  . Attends Religious Services: Not on file  . Active Member of Clubs or Organizations: Not on file  . Attends Archivist Meetings: Not on file  . Marital Status: Not on file   Family History: Family History  Problem Relation Age of Onset  . Hypertension Mother   . Diabetes Other   . Other Other        cardiovascular disorder   Allergies: Allergies  Allergen Reactions  . Other Rash  . Hydrocodone Other (See Comments)    Dizzy , nauseous, swimmy headed.(Can take with phenergan)  . Keflex [Cephalexin]     Diarrhea and more abdominal cramping Diarrhea and more  abdominal cramping  . Codeine Nausea Only  . Tape Rash   Medications: See med rec.  Review of Systems: See HPI for pertinent positives and negatives.   Objective:    General: Well Developed, well nourished, and in no acute distress.  Neuro: Alert and oriented x3.  HEENT: Normocephalic, atraumatic, neck supple, no masses, mild right submandibular lymphadenopathy. Right external ear canal erythematous with clear but injected TM, no bulging or purulent fluid noted.  Skin: Warm and dry. Small scar to the right eyebrow with a minimally detectable nonfluctuant bump, no erythema. Cardiac: Regular rate and rhythm, no murmurs rubs or gallops, no lower extremity edema.  Respiratory: Clear to auscultation bilaterally. Not using accessory muscles, speaking in full sentences.   Impression and Recommendations:    1. Sebaceous cyst As I did not evaluate the patient prior to her sebaceous cyst removal or immediately after, I am unsure of the degree of changes in the surgery site.  We will go ahead and refer her to plastic surgery for reevaluation and discussion of possible treatment. - Ambulatory referral to Plastic Surgery  2. Otalgia of right ear/PND Low suspicion for upper respiratory viral illness or bacterial infection.  Believe this is related to her dental concerns as well as an exacerbation of her allergies.  Continue daily allergy relief medication.  Consider using Flonase.  Okay to try Mucinex or Sudafed and saline rinses/spray.  Return if symptoms worsen or fail to improve. ___________________________________________ Yolanda Sorrel, DNP, APRN, FNP-BC [facility]

## 2020-03-14 ENCOUNTER — Other Ambulatory Visit: Payer: Self-pay | Admitting: Family Medicine

## 2020-03-21 ENCOUNTER — Ambulatory Visit: Payer: BC Managed Care – PPO | Admitting: Family Medicine

## 2020-03-28 ENCOUNTER — Encounter: Payer: Self-pay | Admitting: Family Medicine

## 2020-03-28 ENCOUNTER — Ambulatory Visit (INDEPENDENT_AMBULATORY_CARE_PROVIDER_SITE_OTHER): Payer: BC Managed Care – PPO | Admitting: Family Medicine

## 2020-03-28 VITALS — BP 133/80 | HR 80 | Temp 97.5°F | Ht 64.0 in | Wt 228.0 lb

## 2020-03-28 DIAGNOSIS — I10 Essential (primary) hypertension: Secondary | ICD-10-CM | POA: Diagnosis not present

## 2020-03-28 DIAGNOSIS — F902 Attention-deficit hyperactivity disorder, combined type: Secondary | ICD-10-CM

## 2020-03-28 MED ORDER — LISDEXAMFETAMINE DIMESYLATE 70 MG PO CAPS: 70.0000 mg | ORAL_CAPSULE | Freq: Every day | ORAL | 0 refills | Status: DC

## 2020-03-28 MED ORDER — ATENOLOL 50 MG PO TABS: 50.0000 mg | ORAL_TABLET | Freq: Every day | ORAL | 1 refills | Status: DC

## 2020-03-28 NOTE — Progress Notes (Signed)
All labs are normal. 

## 2020-03-28 NOTE — Assessment & Plan Note (Signed)
Doing well on current regimen.  No recent chest pain shortness of breath.  BP under control.  Refill sent for the next 90 days she will need to call for the fourth month.  Otherwise I will see her back in 4 months.

## 2020-03-28 NOTE — Progress Notes (Signed)
Established Patient Office Visit  Subjective:  Patient ID: Yolanda Mosley, female    DOB: 1981-03-18  Age: 39 y.o. MRN: [id number]  CC:  Chief Complaint  Patient presents with  . ADHD    HPI Allyse Ponce presents for   ADD - Reports symptoms are well controlled on current regime. Denies any problems with insomnia, chest pain, palpitations, or SOB.    Hypertension- Pt denies chest pain, SOB, dizziness, or heart palpitations.  Taking meds as directed w/o problems.  Denies medication side effects.    She walks about 19,000 steps per day while at work. Works at [employer].  Past Medical History:  Diagnosis Date  . Allergy   . Anemia   . Asthma   . 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 GERD (gastroesophageal reflux disease)   . Hemorrhoids    laser tx   . Herpes simplex virus (HSV) infection   . Hypertension   . Obesity   . Ovarian cyst    left  . Sleep apnea   . Tachycardia     Past Surgical History:  Procedure Laterality Date  . OMENTECTOMY  2005  . ovary, bilater fallopian tubes removed  2005  . SALPINGOOPHORECTOMY Right     Family History  Problem Relation Age of Onset  . Hypertension Mother   . Diabetes Other   . Other Other        cardiovascular disorder    Social History   Socioeconomic History  . Marital status: Single    Spouse name: Not on file  . Number of children: Not on file  . Years of education: Not on file  . Highest education level: Not on file  Occupational History  . Not on file  Tobacco Use  . Smoking status: Never Smoker  . Smokeless tobacco: Never Used  Substance and Sexual Activity  . Alcohol use: No  . Drug use: No  . Sexual activity: Yes    Birth control/protection: None  Other Topics Concern  . Not on file  Social History Narrative  . Not on file   Social Determinants of Health   Financial Resource Strain:   . Difficulty of Paying Living Expenses: Not on file  Food Insecurity:   . Worried About Sales executive in the Last Year: Not on file  . Ran Out of Food in the Last Year: Not on file  Transportation Needs:   . Lack of Transportation (Medical): Not on file  . Lack of Transportation (Non-Medical): Not on file  Physical Activity:   . Days of Exercise per Week: Not on file  . Minutes of Exercise per Session: Not on file  Stress:   . Feeling of Stress : Not on file  Social Connections:   . Frequency of Communication with Friends and Family: Not on file  . Frequency of Social Gatherings with Friends and Family: Not on file  . Attends Religious Services: Not on file  . Active Member of Clubs or Organizations: Not on file  . Attends Archivist Meetings: Not on file  . Marital Status: Not on file  Intimate Partner Violence:   . Fear of Current or Ex-Partner: Not on file  . Emotionally Abused: Not on file  . Physically Abused: Not on file  . Sexually Abused: Not on file    Outpatient Medications Prior to Visit  Medication Sig Dispense Refill  . Albuterol Sulfate (PROAIR RESPICLICK) 510 (90  Base) MCG/ACT AEPB Inhale 2 puffs into the lungs every 8 (eight) hours as needed. 1 each 0  . benazepril-hydrochlorthiazide (LOTENSIN HCT) 20-12.5 MG tablet Take 1 tablet by mouth once daily 90 tablet 0  . [employer] ALLERGY RELIEF 180 MG tablet TAKE 1 TABLET (180 MG TOTAL) BY MOUTH DAILY. 90 tablet 0  . Multiple Vitamins-Minerals (MULTI-VITAMIN GUMMIES PO) Take 1 each by mouth daily.    . valACYclovir (VALTREX) 1000 MG tablet Take 1 tablet by mouth twice daily 20 tablet 0  . atenolol (TENORMIN) 50 MG tablet Take 1 tablet by mouth once daily 90 tablet 0  . lisdexamfetamine (VYVANSE) 70 MG capsule Take 1 capsule (70 mg total) by mouth daily before breakfast. 30 capsule 0  . lisdexamfetamine (VYVANSE) 70 MG capsule Take 1 capsule (70 mg total) by mouth daily before breakfast. 30 capsule 0  . lisdexamfetamine (VYVANSE) 70 MG capsule Take 1 capsule (70 mg total) by mouth daily before breakfast. 30 capsule  0  . lisdexamfetamine (VYVANSE) 70 MG capsule Take 1 capsule (70 mg total) by mouth daily. 30 capsule 0   No facility-administered medications prior to visit.    Allergies  Allergen Reactions  . Other Rash  . Hydrocodone Other (See Comments)    Dizzy , nauseous, swimmy headed.(Can take with phenergan)  . Keflex [Cephalexin]     Diarrhea and more abdominal cramping Diarrhea and more abdominal cramping  . Codeine Nausea Only  . Tape Rash    ROS Review of Systems    Objective:    Physical Exam Constitutional:      Appearance: She is well-developed.  HENT:     Head: Normocephalic and atraumatic.  Cardiovascular:     Rate and Rhythm: Normal rate and regular rhythm.     Heart sounds: Normal heart sounds.  Pulmonary:     Effort: Pulmonary effort is normal.     Breath sounds: Normal breath sounds.  Skin:    General: Skin is warm and dry.  Neurological:     Mental Status: She is alert and oriented to person, place, and time.  Psychiatric:        Behavior: Behavior normal.     BP 133/80   Pulse 80   Temp (!) 97.5 F (36.4 C) (Oral)   Ht 5\' 4"  (1.626 m)   Wt 228 lb (103.4 kg)   LMP 03/14/2020 (Exact Date)   SpO2 100% Comment: on RA  BMI 39.14 kg/m  Wt Readings from Last 3 Encounters:  03/28/20 228 lb (103.4 kg)  02/16/20 229 lb 14.4 oz (104.3 kg)  02/05/20 230 lb 12.8 oz (104.7 kg)     There are no preventive care reminders to display for this patient.  There are no preventive care reminders to display for this patient.  Lab Results  Component Value Date   TSH 3.645 01/21/2014   Lab Results  Component Value Date   WBC 11.2 (H) 02/05/2020   HGB 14.1 02/05/2020   HCT 41.5 02/05/2020   MCV 84.9 02/05/2020   PLT 268 02/05/2020   Lab Results  Component Value Date   NA 140 12/08/2018   K 3.2 (L) 12/08/2018   CO2 33 (H) 12/08/2018   GLUCOSE 75 12/08/2018   BUN 23 12/08/2018   CREATININE 0.78 12/08/2018   BILITOT 0.4 06/10/2018   ALKPHOS 109  11/04/2015   AST 18 06/10/2018   ALT 19 06/10/2018   PROT 6.6 06/10/2018   ALBUMIN 4.0 11/04/2015   CALCIUM 9.6 12/08/2018  Lab Results  Component Value Date   CHOL 147 06/10/2018   Lab Results  Component Value Date   HDL 50 06/10/2018   Lab Results  Component Value Date   LDLCALC 79 06/10/2018   Lab Results  Component Value Date   TRIG 92 06/10/2018   Lab Results  Component Value Date   CHOLHDL 2.9 06/10/2018   No results found for: HGBA1C    Assessment & Plan:   Problem List Items Addressed This Visit      Cardiovascular and Mediastinum   HYPERTENSION, MILD - Primary (Chronic)    Blood pressure little borderline elevated today but she says she just took her medication about an hour before she got here.  But reports that she does take it regularly.  Due for CMP and lipid panel.      Relevant Medications   atenolol (TENORMIN) 50 MG tablet   Other Relevant Orders   Lipid Panel w/reflex Direct LDL   COMPLETE METABOLIC PANEL WITH GFR     Other   ADHD    Doing well on current regimen.  No recent chest pain shortness of breath.  BP under control.  Refill sent for the next 90 days she will need to call for the fourth month.  Otherwise I will see her back in 4 months.      Relevant Medications   lisdexamfetamine (VYVANSE) 70 MG capsule (Start on 05/26/2020)   lisdexamfetamine (VYVANSE) 70 MG capsule (Start on 04/26/2020)   lisdexamfetamine (VYVANSE) 70 MG capsule      Meds ordered this encounter  Medications  . lisdexamfetamine (VYVANSE) 70 MG capsule    Sig: Take 1 capsule (70 mg total) by mouth daily before breakfast.    Dispense:  30 capsule    Refill:  0  . lisdexamfetamine (VYVANSE) 70 MG capsule    Sig: Take 1 capsule (70 mg total) by mouth daily before breakfast.    Dispense:  30 capsule    Refill:  0  . lisdexamfetamine (VYVANSE) 70 MG capsule    Sig: Take 1 capsule (70 mg total) by mouth daily.    Dispense:  30 capsule    Refill:  0  . atenolol  (TENORMIN) 50 MG tablet    Sig: Take 1 tablet (50 mg total) by mouth daily.    Dispense:  90 tablet    Refill:  1    Follow-up: Return in about 4 months (around 07/26/2020) for ADD.    Beatrice Lecher, MD

## 2020-03-28 NOTE — Assessment & Plan Note (Addendum)
Blood pressure little borderline elevated today but she says she just took her medication about an hour before she got here.  But reports that she does take it regularly.  Due for CMP and lipid panel.

## 2020-03-30 ENCOUNTER — Encounter: Payer: Self-pay | Admitting: Family Medicine

## 2020-03-30 NOTE — Telephone Encounter (Signed)
Please call lab and have iron and ferritin added on if possible.  Please let patient know that we are calling the lab to have it added on.

## 2020-04-01 LAB — LIPID PANEL W/REFLEX DIRECT LDL
Cholesterol: 156 mg/dL (ref <200)
HDL: 50 mg/dL (ref >=50)
LDL Cholesterol (Calc): 86 mg/dL (calc)
Non-HDL Cholesterol (Calc): 106 mg/dL (calc) (ref <130)
Total CHOL/HDL Ratio: 3.1 (calc) (ref <5.0)
Triglycerides: 100 mg/dL (ref <150)

## 2020-04-01 LAB — COMPLETE METABOLIC PANEL WITH GFR
AG Ratio: 1.4 (calc) (ref 1.0–2.5)
ALT: 22 U/L (ref 6–29)
AST: 17 U/L (ref 10–30)
Albumin: 4.4 g/dL (ref 3.6–5.1)
Alkaline phosphatase (APISO): 117 U/L (ref 31–125)
BUN: 16 mg/dL (ref 7–25)
CO2: 27 mmol/L (ref 20–32)
Calcium: 10.1 mg/dL (ref 8.6–10.2)
Chloride: 104 mmol/L (ref 98–110)
Creat: 0.67 mg/dL (ref 0.50–1.10)
GFR, Est African American: 129 mL/min/{1.73_m2} (ref >=60)
GFR, Est Non African American: 112 mL/min/{1.73_m2} (ref >=60)
Globulin: 3.1 g/dL (calc) (ref 1.9–3.7)
Glucose, Bld: 102 mg/dL — ABNORMAL HIGH (ref 65–99)
Potassium: 3.8 mmol/L (ref 3.5–5.3)
Sodium: 139 mmol/L (ref 135–146)
Total Bilirubin: 0.4 mg/dL (ref 0.2–1.2)
Total Protein: 7.5 g/dL (ref 6.1–8.1)

## 2020-04-01 LAB — FERRITIN: Ferritin: 351 ng/mL — ABNORMAL HIGH (ref 16–154)

## 2020-04-01 LAB — IRON: Iron: 67 ug/dL (ref 40–190)

## 2020-04-05 ENCOUNTER — Other Ambulatory Visit: Payer: Self-pay | Admitting: *Deleted

## 2020-04-05 DIAGNOSIS — R7989 Other specified abnormal findings of blood chemistry: Secondary | ICD-10-CM

## 2020-04-19 ENCOUNTER — Encounter: Payer: Self-pay | Admitting: *Deleted

## 2020-04-19 ENCOUNTER — Telehealth (INDEPENDENT_AMBULATORY_CARE_PROVIDER_SITE_OTHER): Payer: BC Managed Care – PPO | Admitting: Family Medicine

## 2020-04-19 ENCOUNTER — Encounter: Payer: Self-pay | Admitting: Family Medicine

## 2020-04-19 ENCOUNTER — Other Ambulatory Visit: Payer: Self-pay | Admitting: *Deleted

## 2020-04-19 VITALS — BP 128/86 | HR 78 | Temp 96.5°F | Ht 64.0 in | Wt 228.0 lb

## 2020-04-19 DIAGNOSIS — J069 Acute upper respiratory infection, unspecified: Secondary | ICD-10-CM

## 2020-04-19 DIAGNOSIS — R238 Other skin changes: Secondary | ICD-10-CM

## 2020-04-19 DIAGNOSIS — R7989 Other specified abnormal findings of blood chemistry: Secondary | ICD-10-CM

## 2020-04-19 LAB — CBC
HCT: 42 % (ref 35.0–45.0)
Hemoglobin: 14.5 g/dL (ref 11.7–15.5)
MCH: 29.1 pg (ref 27.0–33.0)
MCHC: 34.5 g/dL (ref 32.0–36.0)
MCV: 84.3 fL (ref 80.0–100.0)
MPV: 12 fL (ref 7.5–12.5)
Platelets: 245 10*3/uL (ref 140–400)
RBC: 4.98 10*6/uL (ref 3.80–5.10)
RDW: 13.2 % (ref 11.0–15.0)
WBC: 7.1 10*3/uL (ref 3.8–10.8)

## 2020-04-19 LAB — IRON

## 2020-04-19 LAB — FERRITIN

## 2020-04-19 NOTE — Progress Notes (Signed)
Pt reports that she has had a sore throat x2 days. She has had body aches. Taking Dayquil.   She reports that yesterday her sxs were body aches,nausea,and sore throat. She only has a sore throat today. She feels that this is coming from sinus drainage her R ear was bothering her this morning but that has since stopped    She said that she had contact w/someone with COVID last Thursday. She did a home test for Covid yesterday, this was negative.     She stated that she was supposed to have a recheck of her labs and they only did the cbc. Added on iron and ferritin via [employer] number: [id number].

## 2020-04-19 NOTE — Progress Notes (Signed)
All labs are normal. 

## 2020-04-19 NOTE — Progress Notes (Signed)
Virtual Visit via Telephone Note  I connected with Yolanda Mosley on 04/19/20 at  9:50 AM EST by telephone and verified that I am speaking with the correct person using two identifiers.   I discussed the limitations, risks, security and privacy concerns of performing an evaluation and management service by telephone and the availability of in person appointments. I also discussed with the patient that there may be a patient responsible charge related to this service. The patient expressed understanding and agreed to proceed.  Patient location: at home Provider loccation: In office   Subjective:    CC: ST   HPI: Pt reports that she has had a sore throat x2 days. She has had body aches. Taking Dayquil.  She reports that yesterday her sxs were body aches,nausea,and sore throat. She only has a sore throat today. She feels that this is coming from sinus drainage her R ear was bothering her this morning but that has since stopped. + diarrhea yesterday but not today. No fever.    She said that she had contact w/someone with COVID last Thursday. She did a home test for Covid yesterday, this was negative.   About 3 months ago was taking a lot of IBU for tooth pain. Finally went to the dentist and told needed root canals.  She held her IBU for about 3-4 days before surgery and had a hard time stopping the bleeding.  Had 4 iron infusions recently.  Still bled a lot with the last procedure.  Not on fish oil or ginseng. She bruises easily.  No nosebleeds.  No bleeding with bruising your teeth. Tends to have heavy periods.  Her dentist has been very concerned about how easily she bleeds and has recommended that she have further work-up.  Past medical history, Surgical history, Family history not pertinant except as noted below, Social history, Allergies, and medications have been entered into the medical record, reviewed, and corrections made.   Review of Systems: No fevers, chills, night sweats, weight loss,  chest pain, or shortness of breath.   Objective:    General: Speaking clearly in complete sentences without any shortness of breath.  Alert and oriented x3.  Normal judgment. No apparent acute distress.    Impression and Recommendations:    Upper respiratory infection-symptoms x2 days.  She did have a positive known Covid exposure and had a negative test yesterday encouraged her to repeat the test tomorrow to confirm that she really is negative.  Without cough and fever unlikely to be flu though she is not vaccinated.  She has not had a Covid vaccine either.  Discussed symptomatic care.  Encouraged her to call us if she is not improving or if she suddenly does gets worse or develops new symptoms such as cough or shortness of breath.  Easy bruising/bleeding-she was wondering if this could be coming from low iron but this is not typical.  We can certainly recheck her ferritin levels we tried to have it added onto a CBC that was drawn yesterday.  Will call with those results once available.  But did discuss we can always do additional work-up to look for other bleeding disorders.  She does occasionally have heavy periods but has not had bleeding such as nosebleeds or easy gum bleeding with brushing her teeth.  But she does report easy bruising.      I discussed the assessment and treatment plan with the patient. The patient was provided an opportunity to ask questions and all were answered.  The patient agreed with the plan and demonstrated an understanding of the instructions.   The patient was advised to call back or seek an in-person evaluation if the symptoms worsen or if the condition fails to improve as anticipated.  I provided 22 minutes of non-face-to-face time during this encounter.   Beatrice Lecher, MD

## 2020-04-20 LAB — IRON,TIBC AND FERRITIN PANEL
%SAT: 20 % (calc) (ref 16–45)
Ferritin: 165 ng/mL — ABNORMAL HIGH (ref 16–154)
Iron: 60 ug/dL (ref 40–190)
TIBC: 304 mcg/dL (calc) (ref 250–450)

## 2020-05-03 ENCOUNTER — Other Ambulatory Visit: Payer: Self-pay | Admitting: Family Medicine

## 2020-05-04 ENCOUNTER — Other Ambulatory Visit: Payer: Self-pay | Admitting: Family Medicine

## 2020-05-04 MED ORDER — VALACYCLOVIR HCL 1 G PO TABS
1000.0000 mg | ORAL_TABLET | Freq: Two times a day (BID) | ORAL | 0 refills | Status: DC
Start: 2020-05-04 — End: 2020-08-11

## 2020-05-06 ENCOUNTER — Telehealth (INDEPENDENT_AMBULATORY_CARE_PROVIDER_SITE_OTHER): Payer: BC Managed Care – PPO | Admitting: Nurse Practitioner

## 2020-05-06 ENCOUNTER — Encounter: Payer: Self-pay | Admitting: Nurse Practitioner

## 2020-05-06 DIAGNOSIS — J4530 Mild persistent asthma, uncomplicated: Secondary | ICD-10-CM

## 2020-05-06 DIAGNOSIS — U071 COVID-19: Secondary | ICD-10-CM | POA: Diagnosis not present

## 2020-05-06 MED ORDER — ALBUTEROL SULFATE (2.5 MG/3ML) 0.083% IN NEBU: 2.5000 mg | INHALATION_SOLUTION | RESPIRATORY_TRACT | 2 refills | Status: DC | PRN

## 2020-05-06 MED ORDER — ALBUTEROL SULFATE HFA 108 (90 BASE) MCG/ACT IN AERS: 1.0000 | INHALATION_SPRAY | RESPIRATORY_TRACT | 1 refills | Status: DC | PRN

## 2020-05-06 NOTE — Patient Instructions (Addendum)
You can find COVID-19 testing locations in your area through the following links:  [email or web address] If you go to [email or web address] and search COVID testing  Text COVID to [phone]  [email or web address] If you go to the [place].gov website and search COVID testing  [email or web address]  Additional testing locations are scattered throughout the community at [place], [place], and other health centers.   Due to the incredibly high volume of symptomatic patients in our community, many testing centers are overwhelmed and same day testing may not be available. It is very important that you quarantine while you are having symptoms and until you are tested and your test results have returned.   Over the counter medications that may be helpful for symptoms: . Guaifenesin 1200 mg extended release tabs twice daily, with plenty of water o For cough and congestion o Brand name: Mucinex  - Mucinex D (has Sudafed in it) . Pseudoephedrine 30 mg, one or two tabs every 4 to 6 hours o For sinus congestion o Brand name: Sudafed o You must get this from the pharmacy counter.  . Oxymetazoline nasal spray each morning, one spray in each nostril, for NO MORE THAN 3 days  o For nasal and sinus congestion o Brand name: Afrin . Saline nasal spray or Saline Nasal Irrigation 3-5 times a day o For nasal and sinus congestion o Brand names: [place] or AYR . Fluticasone nasal spray, one spray in each nostril, each morning after oxymetazoline and saline, if used o For nasal and sinus congestion o Brand name: Flonase . Warm salt water gargles  o For sore throat o Every few hours as needed . Alternate ibuprofen 400-600 mg and acetaminophen 1000 mg every 4-6 hours o For fever, body aches, headache o Brand names: Motrin or Advil and Tylenol . Dextromethorphan 12-hour cough version 30 mg every 12 hours  o For cough o Brand name:  Delsym Check all other medications that you are taking (Nyquil, Dayquil, Tylenol Cold, Theraflu, etc) and other non-prescription cough/cold preparations to make sure that you are not take two of the same medication. Many of these have the same ingredients listed above and could cause an overdose of medication.   General Instructions . Allow your body to rest . Drink PLENTY of fluids . Isolate yourself from everyone, even family, until test results have returned  If your COVID-19 test is positive . Then you ARE INFECTED and you can pass the virus to others . You must quarantine from others for a minimum of  o 10 days since symptoms started AND o You are fever free for 24 hours WITHOUT any medication to reduce fever AND o Your symptoms are improving . Do not go to the store or other public areas . Do not go around household members who are not known to be infected with COVID-19 . If you MUST leave you area of quarantine (example: go to a bathroom you share with others in your home), you must o Wear a mask o Wash your hands thoroughly o Wipe down any surfaces you touch . Do not share food, drinks, towels, or other items with other persons . Dispose of your own tissues, food containers, etc  Once you have recovered, please continue good preventive care measures, including:  . wearing a mask when in public . wash your hands frequently . avoid touching your face/nose/eyes . cover coughs/sneezes with the inside of your elbow . stay out of crowds . keep a 6 foot distance from others  Go to the  nearest hospital emergency room if fever is above 103 and not coming down with tylenol, if you start having severe shortness of breath at rest or feelings like you cannot get enough air.

## 2020-05-06 NOTE — Progress Notes (Signed)
Virtual Video Visit via MyChart Note- converted to telephone visit  I connected with  [name] on 05/06/20 at 10:50 AM EST by the video enabled telemedicine application for , MyChart, and verified that I am speaking with the correct person using two identifiers.   I introduced myself as a Designer, jewellery with the practice. We discussed the limitations of evaluation and management by telemedicine and the availability of in person appointments. The patient expressed understanding and agreed to proceed.  Participating parties in this visit include: The patient and the nurse practitioner listed.  The patient is: At home I am: In the office  Subjective:    CC: No chief complaint on file.   HPI: Yolanda Mosley is a 40 y.o. year old female presenting today via [facility] today for positive COVID-19 home test yesterday. She reports her symptoms started on Wednesday (05/04/2020) of this week with body aches, sore throat, fever, cough, congestion, and diarrhea. She reports that her asthma is acting up a little at this time, but nothing severe. She is taking tylenol for fever reduction and her fevers are staying about 99 degrees. She reports that she has noticed her blood pressure is a little elevated at 129/87 and her heart rate is 101.   She denies wheezing, shortness of breath at rest, chest pain or pressure, vomiting, inability to hold down liquids.  Started on Wednesday She works at [employer] and the home test is not sufficient for work, she must have a PCR test. She also reports that she was told she would be expected to return to work in 5 days from symptom onset.   She has not been vaccinated  Past medical history, Surgical history, Family history not pertinant except as noted below, Social history, Allergies, and medications have been entered into the medical record, reviewed, and corrections made.   Review of Systems:  All review of systems negative except what is listed in the  HPI   Objective:    General:  Speaking clearly in complete sentences. Absent shortness of breath noted.   Alert and oriented x3.   Normal judgment.  Absent acute distress. She is audibly congested and does have a cough present.   Impression and Recommendations:    1. Mild persistent asthma without complication Refill provided of both inhaler and nebulizer for symptom management of asthma.  - albuterol (PROVENTIL) (2.5 MG/3ML) 0.083% nebulizer solution; Take 3 mLs (2.5 mg total) by nebulization every 4 (four) hours as needed for wheezing or shortness of breath (please include nebulizer machine, hoses, and mask if needed.).  Dispense: 540 mL; Refill: 2 - albuterol (VENTOLIN HFA) 108 (90 Base) MCG/ACT inhaler; Inhale 1-2 puffs into the lungs every 4 (four) hours as needed for wheezing or shortness of breath.  Dispense: 6.7 g; Refill: 1  2. COVID-19 Symptoms and presentation consistent with positive COVID-19. At home test is positive. Recommendation for testing locations provided to patient due to non-availability for testing in our office today.  Symptom management information provided for patient.  Discussed quarantine with patient and strongly encourage 10 day quarantine given the unvaccinated status and presence of symptoms. I did provide a work note detailing these recommendations for the patient.  Discussed emergency signs that would warrant immediate evaluation in the emergency room.  - albuterol (PROVENTIL) (2.5 MG/3ML) 0.083% nebulizer solution; Take 3 mLs (2.5 mg total) by nebulization every 4 (four) hours as needed for wheezing or shortness of breath (please include nebulizer machine, hoses, and mask if needed.).  Dispense: 540 mL; Refill: 2 - albuterol (VENTOLIN HFA) 108 (90 Base) MCG/ACT inhaler; Inhale 1-2 puffs into the lungs every 4 (four) hours as needed for wheezing or shortness of breath.  Dispense: 6.7 g; Refill: 1  Follow-up if symptoms worsen or fail to improve.    I  discussed the assessment and treatment plan with the patient. The patient was provided an opportunity to ask questions and all were answered. The patient agreed with the plan and demonstrated an understanding of the instructions.   The patient was advised to call back or seek an in-person evaluation if the symptoms worsen or if the condition fails to improve as anticipated.  I provided 30 minutes of non-face-to-face interaction with this [place] visit including intake, same-day documentation, and chart review.   Orma Render, NP

## 2020-05-31 ENCOUNTER — Other Ambulatory Visit: Payer: Self-pay

## 2020-05-31 ENCOUNTER — Encounter: Payer: Self-pay | Admitting: Family Medicine

## 2020-05-31 ENCOUNTER — Ambulatory Visit (INDEPENDENT_AMBULATORY_CARE_PROVIDER_SITE_OTHER): Payer: BC Managed Care – PPO | Admitting: Family Medicine

## 2020-05-31 VITALS — BP 124/80 | HR 85 | Ht 64.0 in | Wt 233.0 lb

## 2020-05-31 DIAGNOSIS — H539 Unspecified visual disturbance: Secondary | ICD-10-CM

## 2020-05-31 DIAGNOSIS — I10 Essential (primary) hypertension: Secondary | ICD-10-CM | POA: Diagnosis not present

## 2020-05-31 DIAGNOSIS — M542 Cervicalgia: Secondary | ICD-10-CM | POA: Diagnosis not present

## 2020-05-31 DIAGNOSIS — R0789 Other chest pain: Secondary | ICD-10-CM

## 2020-05-31 MED ORDER — DOXAZOSIN MESYLATE 2 MG PO TABS: 2.0000 mg | ORAL_TABLET | Freq: Every day | ORAL | 0 refills | Status: DC

## 2020-05-31 NOTE — Progress Notes (Signed)
Established Patient Office Visit  Subjective:  Patient ID: Yolanda Mosley, female    DOB: Mar 07, 1981  Age: 40 y.o. MRN: [id number]  CC:  Chief Complaint  Patient presents with  . Hospitalization Follow-up    HPI Yolanda Mosley presents for   Pt was seen at [facility] last night. She stated that she had been experiencing headaches and chest pains since Friday. She was told that she would need to get a referral to a cardiologist for ECHO and Stress test.   She stated that the labs and EKG did not show anything  She c/o still having some L sided neck pain and when the headaches were happening when she would turn her head from side to side she would see flashes of light. She doesn't have a Hx of migraines. And cannot recall her last formal eye exam.   Past Medical History:  Diagnosis Date  . Allergy   . Anemia   . Asthma   . 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 GERD (gastroesophageal reflux disease)   . Hemorrhoids    laser tx   . Herpes simplex virus (HSV) infection   . Hypertension   . Obesity   . Ovarian cyst    left  . Sleep apnea   . Tachycardia     Past Surgical History:  Procedure Laterality Date  . OMENTECTOMY  2005  . ovary, bilater fallopian tubes removed  2005  . SALPINGOOPHORECTOMY Right     Family History  Problem Relation Age of Onset  . Hypertension Mother   . Diabetes Other   . Other Other        cardiovascular disorder    Social History   Socioeconomic History  . Marital status: Single    Spouse name: Not on file  . Number of children: Not on file  . Years of education: Not on file  . Highest education level: Not on file  Occupational History  . Not on file  Tobacco Use  . Smoking status: Never Smoker  . Smokeless tobacco: Never Used  Substance and Sexual Activity  . Alcohol use: No  . Drug use: No  . Sexual activity: Yes    Birth control/protection: None  Other Topics Concern  . Not on file  Social History Narrative  . Not on  file   Social Determinants of Health   Financial Resource Strain: Not on file  Food Insecurity: Not on file  Transportation Needs: Not on file  Physical Activity: Not on file  Stress: Not on file  Social Connections: Not on file  Intimate Partner Violence: Not on file    Outpatient Medications Prior to Visit  Medication Sig Dispense Refill  . albuterol (PROVENTIL) (2.5 MG/3ML) 0.083% nebulizer solution Take 3 mLs (2.5 mg total) by nebulization every 4 (four) hours as needed for wheezing or shortness of breath (please include nebulizer machine, hoses, and mask if needed.). 540 mL 2  . albuterol (VENTOLIN HFA) 108 (90 Base) MCG/ACT inhaler Inhale 1-2 puffs into the lungs every 4 (four) hours as needed for wheezing or shortness of breath. 6.7 g 1  . atenolol (TENORMIN) 50 MG tablet Take 1 tablet (50 mg total) by mouth daily. 90 tablet 1  . benazepril-hydrochlorthiazide (LOTENSIN HCT) 20-12.5 MG tablet Take 1 tablet by mouth once daily 90 tablet 0  . [facility] ALLERGY RELIEF 180 MG tablet TAKE 1 TABLET (180 MG TOTAL) BY MOUTH DAILY. 90 tablet 0  .  lisdexamfetamine (VYVANSE) 70 MG capsule Take 1 capsule (70 mg total) by mouth daily before breakfast. 30 capsule 0  . lisdexamfetamine (VYVANSE) 70 MG capsule Take 1 capsule (70 mg total) by mouth daily before breakfast. 30 capsule 0  . lisdexamfetamine (VYVANSE) 70 MG capsule Take 1 capsule (70 mg total) by mouth daily. 30 capsule 0  . Multiple Vitamins-Minerals (MULTI-VITAMIN GUMMIES PO) Take 1 each by mouth daily.    . valACYclovir (VALTREX) 1000 MG tablet Take 1 tablet (1,000 mg total) by mouth 2 (two) times daily. 20 tablet 0   No facility-administered medications prior to visit.    Allergies  Allergen Reactions  . Ibuprofen     Severe bleeding  . Other Rash  . Hydrocodone Other (See Comments)    Dizzy , nauseous, swimmy headed.(Can take with phenergan)  . Keflex [Cephalexin]     Diarrhea and more abdominal cramping Diarrhea and more  abdominal cramping  . Codeine Nausea Only  . Tape Rash    ROS Review of Systems    Objective:    Physical Exam Constitutional:      Appearance: She is well-developed and well-nourished.  HENT:     Head: Normocephalic and atraumatic.  Cardiovascular:     Rate and Rhythm: Normal rate and regular rhythm.     Heart sounds: Normal heart sounds.  Pulmonary:     Effort: Pulmonary effort is normal.     Breath sounds: Normal breath sounds.  Skin:    General: Skin is warm and dry.  Neurological:     Mental Status: She is alert and oriented to person, place, and time.  Psychiatric:        Mood and Affect: Mood and affect normal.        Behavior: Behavior normal.     BP 124/80   Pulse 85   Ht 5\' 4"  (1.626 m)   Wt 233 lb (105.7 kg)   LMP 05/29/2020 (Exact Date)   SpO2 99%   BMI 39.99 kg/m  Wt Readings from Last 3 Encounters:  05/31/20 233 lb (105.7 kg)  04/19/20 228 lb (103.4 kg)  03/28/20 228 lb (103.4 kg)     Health Maintenance Due  Topic Date Due  . PAP SMEAR-Modifier  09/18/2019    There are no preventive care reminders to display for this patient.  Lab Results  Component Value Date   TSH 3.645 01/21/2014   Lab Results  Component Value Date   WBC 7.1 04/18/2020   HGB 14.5 04/18/2020   HCT 42.0 04/18/2020   MCV 84.3 04/18/2020   PLT 245 04/18/2020   Lab Results  Component Value Date   NA 139 03/28/2020   K 3.8 03/28/2020   CO2 27 03/28/2020   GLUCOSE 102 (H) 03/28/2020   BUN 16 03/28/2020   CREATININE 0.67 03/28/2020   BILITOT 0.4 03/28/2020   ALKPHOS 109 11/04/2015   AST 17 03/28/2020   ALT 22 03/28/2020   PROT 7.5 03/28/2020   ALBUMIN 4.0 11/04/2015   CALCIUM 10.1 03/28/2020   Lab Results  Component Value Date   CHOL 156 03/28/2020   Lab Results  Component Value Date   HDL 50 03/28/2020   Lab Results  Component Value Date   LDLCALC 86 03/28/2020   Lab Results  Component Value Date   TRIG 100 03/28/2020   Lab Results  Component  Value Date   CHOLHDL 3.1 03/28/2020   No results found for: HGBA1C    Assessment & Plan:  Problem List Items Addressed This Visit      Cardiovascular and Mediastinum   HYPERTENSION, MILD (Chronic)    Having episode elevations in BP. Consider dx such as pheochromocytoma.  Hold your atenolol and your benazepril for 2 weeks and then go to the lab to check you for condition called pheochromocytoma.  During that time and get a send over a different medication called doxazosin for you to take daily in place of the other 2 blood pressure pills.  I can always go up on the medication if your blood pressure is still running a little high on the medicine.  But the other 2 drugs can affect the results of the test.  Will order Echo and stress test.   D-dimer was elevated in the ED.  No SOB.   Will check for Thyroid d/o.        Relevant Medications   doxazosin (CARDURA) 2 MG tablet    Other Visit Diagnoses    Uncontrolled hypertension    -  Primary   Relevant Medications   doxazosin (CARDURA) 2 MG tablet   Other Relevant Orders   Catecholamines, fractionated, Urine, 24 hour   Metanephrines, Urine, 24 hour   Catecholamines, Fractionated, Plasma   Metanephrines, plasma   Aldosterone + renin activity w/ ratio   TSH   ECHOCARDIOGRAM COMPLETE   Exercise Tolerance Test   Cardiac Stress Test: Informed Consent Details: Physician/Practitioner Attestation; Transcribe to consent form and obtain patient signature   Atypical chest pain       Relevant Orders   Catecholamines, fractionated, Urine, 24 hour   Metanephrines, Urine, 24 hour   Catecholamines, Fractionated, Plasma   Metanephrines, plasma   Aldosterone + renin activity w/ ratio   TSH   ECHOCARDIOGRAM COMPLETE   Exercise Tolerance Test   Cardiac Stress Test: Informed Consent Details: Physician/Practitioner Attestation; Transcribe to consent form and obtain patient signature   Neck pain       Relevant Orders   Catecholamines,  fractionated, Urine, 24 hour   Metanephrines, Urine, 24 hour   Catecholamines, Fractionated, Plasma   Metanephrines, plasma   ECHOCARDIOGRAM COMPLETE   Vision changes       Relevant Orders   Ambulatory referral to Optometry      Atypical chest pain-we will work-up further with echocardiogram and stress test.  Vision Changes-recommend referral to an eye doctor for further work-up  Meds ordered this encounter  Medications  . doxazosin (CARDURA) 2 MG tablet    Sig: Take 1 tablet (2 mg total) by mouth daily.    Dispense:  30 tablet    Refill:  0    Follow-up: No follow-ups on file.   I spent 35 minutes on the day of the encounter to include pre-visit record review, face-to-face time with the patient and post visit ordering of test.   Beatrice Lecher, MD

## 2020-05-31 NOTE — Patient Instructions (Addendum)
Hold your atenolol and your benazepril for 2 weeks and then go to the lab to check you for condition called pheochromocytoma.  During that time and get a send over a different medication called doxazosin for you to take daily in place of the other 2 blood pressure pills.  I can always go up on the medication if your blood pressure is still running a little high on the medicine.  But the other 2 drugs can affect the results of the test.

## 2020-05-31 NOTE — Progress Notes (Signed)
Pt was seen at [facility] last night. She stated that she had been experiencing headaches and chest pains since Friday. She was told that she would need to get a referral to a cardiologist for ECHO and Stress test.   She stated that the labs and EKG did not show anything  She c/o still having some L sided neck pain and when the headaches were happening when she would turn her head from side to side she would see flashes of light. She doesn't have a Hx of migraines. And cannot recall her last formal eye exam.

## 2020-05-31 NOTE — Assessment & Plan Note (Signed)
Having episode elevations in BP. Consider dx such as pheochromocytoma.  Hold your atenolol and your benazepril for 2 weeks and then go to the lab to check you for condition called pheochromocytoma.  During that time and get a send over a different medication called doxazosin for you to take daily in place of the other 2 blood pressure pills.  I can always go up on the medication if your blood pressure is still running a little high on the medicine.  But the other 2 drugs can affect the results of the test.  Will order Echo and stress test.   D-dimer was elevated in the ED.  No SOB.   Will check for Thyroid d/o.

## 2020-06-01 ENCOUNTER — Encounter: Payer: Self-pay | Admitting: Family Medicine

## 2020-06-01 ENCOUNTER — Telehealth: Payer: Self-pay

## 2020-06-01 DIAGNOSIS — R079 Chest pain, unspecified: Secondary | ICD-10-CM

## 2020-06-01 NOTE — Telephone Encounter (Signed)
Ok to increase the med to BID.  Try to take about 12 hours apart.  Avoid taking you Vyvanse while BP is high.  Make sure eating very low salt dit.

## 2020-06-01 NOTE — Telephone Encounter (Signed)
Patient advised of recommendations.  

## 2020-06-01 NOTE — Telephone Encounter (Signed)
Same issue. Can raise BP so need to hold med for now.  I can write her out of work if she feels she can't work without it

## 2020-06-01 NOTE — Telephone Encounter (Signed)
Yolanda Mosley called and states her blood pressure on the new medication is elevated. [id number]. She did take the Doxazosin 2 mg around 11 am. Please advise.

## 2020-06-02 ENCOUNTER — Ambulatory Visit: Payer: BC Managed Care – PPO | Admitting: Family Medicine

## 2020-06-03 NOTE — Telephone Encounter (Signed)
Yolanda Mosley came by the office today.

## 2020-06-04 ENCOUNTER — Other Ambulatory Visit (HOSPITAL_COMMUNITY): Payer: BC Managed Care – PPO

## 2020-06-06 ENCOUNTER — Encounter: Payer: Self-pay | Admitting: Family Medicine

## 2020-06-06 MED ORDER — HYDROCHLOROTHIAZIDE 25 MG PO TABS: 25.0000 mg | ORAL_TABLET | Freq: Every day | ORAL | 0 refills | Status: DC

## 2020-06-07 ENCOUNTER — Telehealth (INDEPENDENT_AMBULATORY_CARE_PROVIDER_SITE_OTHER): Payer: BC Managed Care – PPO | Admitting: Family Medicine

## 2020-06-07 ENCOUNTER — Encounter (HOSPITAL_COMMUNITY): Payer: BC Managed Care – PPO

## 2020-06-07 ENCOUNTER — Encounter: Payer: Self-pay | Admitting: Family Medicine

## 2020-06-07 VITALS — BP 147/97 | HR 89 | Wt 233.0 lb

## 2020-06-07 DIAGNOSIS — M7989 Other specified soft tissue disorders: Secondary | ICD-10-CM

## 2020-06-07 DIAGNOSIS — R519 Headache, unspecified: Secondary | ICD-10-CM | POA: Diagnosis not present

## 2020-06-07 DIAGNOSIS — R609 Edema, unspecified: Secondary | ICD-10-CM | POA: Diagnosis not present

## 2020-06-07 DIAGNOSIS — I10 Essential (primary) hypertension: Secondary | ICD-10-CM | POA: Diagnosis not present

## 2020-06-07 NOTE — Telephone Encounter (Signed)
Pt scheduled for appt today.

## 2020-06-07 NOTE — Progress Notes (Unsigned)
Virtual Visit via Video Note  I connected with Yolanda Mosley on 06/10/20 at  2:20 PM EST by a video enabled telemedicine application and verified that I am speaking with the correct person using two identifiers.   I discussed the limitations of evaluation and management by telemedicine and the availability of in person appointments. The patient expressed understanding and agreed to proceed.  Patient location:at home  Provider location: in office  Subjective:    CC: BP  HPI:  Pt reports that she has a lot of edema since Saturday in her face, ankles, feet and legs. She said that it is mostly on the R side. BPs have been more 150-160s.    When she bends down her chest gets tight and lying down the pain is worse. Also feels SOB.    The headache is mild it comes and goes its like a "achy" headache. She has been using Excedrin and this takes the edge off. she felt "fluffy over the weekend with swollen face and eye.  Her ankles and feet are red splotchy and itchy.     Past medical history, Surgical history, Family history not pertinant except as noted below, Social history, Allergies, and medications have been entered into the medical record, reviewed, and corrections made.   Review of Systems: No fevers, chills, night sweats, weight loss, chest pain, or shortness of breath.   Objective:    General: Speaking clearly in complete sentences without any shortness of breath.  Alert and oriented x3.  Normal judgment. No apparent acute distress.    Impression and Recommendations:    HYPERTENSION, MILD BP stillnot well controlled. Off of her vyvanse for now so we can make sure not affecting her BP in any way. Holding additional BP meds to we can test for pheochromocytoma.   Peripheral edema Just started the hydrochlorothiazide 25 mg she is taken 2 doses thus far.  Continue through the weekend continue to work on low-salt diet and see if the swelling improves.  I would like for her to go  ahead and get the TSH CBC D-dimer and troponin labs done and we can wait until Friday to do the aldosterone etc.   Time spent in encounter 22 minutes  I discussed the assessment and treatment plan with the patient. The patient was provided an opportunity to ask questions and all were answered. The patient agreed with the plan and demonstrated an understanding of the instructions.   The patient was advised to call back or seek an in-person evaluation if the symptoms worsen or if the condition fails to improve as anticipated.   Beatrice Lecher, MD

## 2020-06-07 NOTE — Progress Notes (Signed)
Pt reports that she has a lot of edema since Saturday in her face, ankles, feet and legs. She said that it is mostly on the R side.   When she bends down her chest gets tight and lying down the pain is worse.   The headache is mild it comes and goes its like a "achy" headache. She has been using Excedrin and this takes the edge off.

## 2020-06-10 ENCOUNTER — Telehealth (HOSPITAL_COMMUNITY): Payer: Self-pay | Admitting: *Deleted

## 2020-06-10 NOTE — Assessment & Plan Note (Signed)
Just started the hydrochlorothiazide 25 mg she is taken 2 doses thus far.  Continue through the weekend continue to work on low-salt diet and see if the swelling improves.

## 2020-06-10 NOTE — Assessment & Plan Note (Signed)
BP stillnot well controlled. Off of her vyvanse for now so we can make sure not affecting her BP in any way. Holding additional BP meds to we can test for pheochromocytoma.

## 2020-06-10 NOTE — Telephone Encounter (Signed)
Close encounter 

## 2020-06-11 ENCOUNTER — Other Ambulatory Visit (HOSPITAL_COMMUNITY): Payer: BC Managed Care – PPO

## 2020-06-13 ENCOUNTER — Encounter: Payer: Self-pay | Admitting: Family Medicine

## 2020-06-13 LAB — CBC WITH DIFFERENTIAL/PLATELET
Absolute Monocytes: 445 cells/uL (ref 200–950)
Basophils Absolute: 50 cells/uL (ref 0–200)
Basophils Relative: 0.6 %
Eosinophils Absolute: 311 cells/uL (ref 15–500)
Eosinophils Relative: 3.7 %
HCT: 40.8 % (ref 35.0–45.0)
Hemoglobin: 13.6 g/dL (ref 11.7–15.5)
Lymphs Abs: 2587 cells/uL (ref 850–3900)
MCH: 28.3 pg (ref 27.0–33.0)
MCHC: 33.3 g/dL (ref 32.0–36.0)
MCV: 85 fL (ref 80.0–100.0)
MPV: 11.3 fL (ref 7.5–12.5)
Monocytes Relative: 5.3 %
Neutro Abs: 5006 cells/uL (ref 1500–7800)
Neutrophils Relative %: 59.6 %
Platelets: 228 10*3/uL (ref 140–400)
RBC: 4.8 10*6/uL (ref 3.80–5.10)
RDW: 13.9 % (ref 11.0–15.0)
Total Lymphocyte: 30.8 %
WBC: 8.4 10*3/uL (ref 3.8–10.8)

## 2020-06-13 LAB — D-DIMER, QUANTITATIVE: D-Dimer, Quant: 0.81 mcg/mL FEU — ABNORMAL HIGH (ref <0.50)

## 2020-06-13 LAB — TROPONIN I: Troponin I: 4 ng/L (ref <=47)

## 2020-06-13 LAB — TSH: TSH: 5.01 mIU/L — ABNORMAL HIGH

## 2020-06-14 ENCOUNTER — Other Ambulatory Visit: Payer: Self-pay

## 2020-06-14 ENCOUNTER — Encounter: Payer: Self-pay | Admitting: Family Medicine

## 2020-06-14 ENCOUNTER — Ambulatory Visit (HOSPITAL_COMMUNITY)
Admission: RE | Admit: 2020-06-14 | Discharge: 2020-06-14 | Disposition: A | Discharge status: Home / Self Care | Payer: BC Managed Care – PPO | Source: Ambulatory Visit | Attending: Cardiology | Admitting: Cardiology

## 2020-06-14 DIAGNOSIS — I10 Essential (primary) hypertension: Secondary | ICD-10-CM | POA: Diagnosis not present

## 2020-06-14 DIAGNOSIS — R0789 Other chest pain: Secondary | ICD-10-CM | POA: Insufficient documentation

## 2020-06-14 LAB — EXERCISE TOLERANCE TEST
Estimated workload: 4.6 METS
Exercise duration (min): 3 min
Exercise duration (sec): 0 s
MPHR: 182 {beats}/min
Peak HR: 148 {beats}/min
Percent HR: 81 %
Rest HR: 83 {beats}/min

## 2020-06-15 ENCOUNTER — Telehealth: Payer: Self-pay | Admitting: Family Medicine

## 2020-06-15 ENCOUNTER — Ambulatory Visit (HOSPITAL_COMMUNITY)
Admission: RE | Admit: 2020-06-15 | Discharge: 2020-06-15 | Disposition: A | Discharge status: Home / Self Care | Payer: BC Managed Care – PPO | Source: Ambulatory Visit | Attending: Family Medicine | Admitting: Family Medicine

## 2020-06-15 DIAGNOSIS — I10 Essential (primary) hypertension: Secondary | ICD-10-CM | POA: Diagnosis not present

## 2020-06-15 DIAGNOSIS — R600 Localized edema: Secondary | ICD-10-CM | POA: Diagnosis not present

## 2020-06-15 DIAGNOSIS — M542 Cervicalgia: Secondary | ICD-10-CM

## 2020-06-15 DIAGNOSIS — R0789 Other chest pain: Secondary | ICD-10-CM | POA: Insufficient documentation

## 2020-06-15 DIAGNOSIS — R609 Edema, unspecified: Secondary | ICD-10-CM

## 2020-06-15 DIAGNOSIS — R079 Chest pain, unspecified: Secondary | ICD-10-CM

## 2020-06-15 LAB — ECHOCARDIOGRAM COMPLETE
Area-P 1/2: 5.27 cm2
S' Lateral: 3.5 cm

## 2020-06-15 NOTE — Progress Notes (Signed)
  Echocardiogram 2D Echocardiogram with 3D has been performed.  Yolanda Mosley M 06/15/2020, 12:19 PM

## 2020-06-15 NOTE — Telephone Encounter (Signed)
Yolanda Mosley called and stated she has been talking with you about a Cardiology referral and she would like to go to the [facility] location. - [facility]

## 2020-06-15 NOTE — Telephone Encounter (Signed)
Referral placed.

## 2020-06-16 ENCOUNTER — Encounter: Payer: Self-pay | Admitting: Family Medicine

## 2020-06-20 ENCOUNTER — Encounter: Payer: Self-pay | Admitting: Family Medicine

## 2020-06-20 ENCOUNTER — Other Ambulatory Visit: Payer: Self-pay | Admitting: Family Medicine

## 2020-06-20 LAB — METANEPHRINES, URINE, 24 HOUR
METANEPHRINE: 110 mcg/24 h (ref 36–190)
METANEPHRINES, TOTAL: 898 mcg/24 h — ABNORMAL HIGH (ref 115–695)
NORMETANEPHRINE: 788 mcg/24 h — ABNORMAL HIGH (ref 35–482)
Total Volume: 1500 mL

## 2020-06-20 LAB — CATECHOLAMINES, FRACTIONATED, URINE, 24 HOUR: Total Volume: 1500

## 2020-06-21 ENCOUNTER — Encounter: Payer: Self-pay | Admitting: Family Medicine

## 2020-06-21 ENCOUNTER — Other Ambulatory Visit: Payer: Self-pay

## 2020-06-21 ENCOUNTER — Telehealth: Payer: Self-pay

## 2020-06-21 MED ORDER — DOXAZOSIN MESYLATE 2 MG PO TABS: 2.0000 mg | ORAL_TABLET | Freq: Every day | ORAL | 0 refills | Status: DC

## 2020-06-21 NOTE — Telephone Encounter (Signed)
PA approved. Patient advised.  

## 2020-06-21 NOTE — Telephone Encounter (Signed)
A PA for Vyvanse has been started through CoverMyMeds.

## 2020-06-22 ENCOUNTER — Other Ambulatory Visit: Payer: Self-pay | Admitting: *Deleted

## 2020-06-22 DIAGNOSIS — I10 Essential (primary) hypertension: Secondary | ICD-10-CM

## 2020-06-22 DIAGNOSIS — R7989 Other specified abnormal findings of blood chemistry: Secondary | ICD-10-CM

## 2020-06-22 LAB — ALDOSTERONE + RENIN ACTIVITY W/ RATIO
ALDO / PRA Ratio: 1.8 Ratio (ref 0.9–28.9)
Aldosterone: 6 ng/dL
Renin Activity: 3.36 ng/mL/h (ref 0.25–5.82)

## 2020-06-22 LAB — CATECHOLAMINES, FRACTIONATED, PLASMA
Dopamine: <10 pg/mL
Epinephrine: <20 pg/mL
Norepinephrine: 456 pg/mL
Total Catecholamines: 456 pg/mL

## 2020-06-22 LAB — METANEPHRINES, PLASMA
Metanephrine, Free: <25 pg/mL (ref <=57)
Normetanephrine, Free: 93 pg/mL (ref <=148)
Total Metanephrines-Plasma: 93 pg/mL (ref <=205)

## 2020-06-23 DIAGNOSIS — N83209 Unspecified ovarian cyst, unspecified side: Secondary | ICD-10-CM | POA: Insufficient documentation

## 2020-06-23 DIAGNOSIS — G4730 Sleep apnea, unspecified: Secondary | ICD-10-CM | POA: Insufficient documentation

## 2020-06-23 DIAGNOSIS — B009 Herpesviral infection, unspecified: Secondary | ICD-10-CM | POA: Insufficient documentation

## 2020-06-23 DIAGNOSIS — D649 Anemia, unspecified: Secondary | ICD-10-CM | POA: Insufficient documentation

## 2020-06-23 DIAGNOSIS — T7840XA Allergy, unspecified, initial encounter: Secondary | ICD-10-CM | POA: Insufficient documentation

## 2020-06-23 DIAGNOSIS — J45909 Unspecified asthma, uncomplicated: Secondary | ICD-10-CM | POA: Insufficient documentation

## 2020-06-23 DIAGNOSIS — I10 Essential (primary) hypertension: Secondary | ICD-10-CM | POA: Insufficient documentation

## 2020-06-23 DIAGNOSIS — K219 Gastro-esophageal reflux disease without esophagitis: Secondary | ICD-10-CM | POA: Insufficient documentation

## 2020-06-25 DIAGNOSIS — I517 Cardiomegaly: Secondary | ICD-10-CM | POA: Insufficient documentation

## 2020-06-25 NOTE — Progress Notes (Addendum)
Cardiology Office Note:    Date:  06/27/2020   ID:  Yolanda Mosley, DOB [birth year], MRN [id number]  PCP:  Hali Marry, MD  Cardiologist:  Shirlee More, MD   Referring MD: Hali Marry, *    Today I reviewed the results of her cardiac CTA with Dr. Marlou Porch. There is an area of soft plaque and what is felt to be moderate or more severe stenosis in the LAD.  She was sent for [facility] but technically that area the artery could not be mild. I called the patient and reviewed her test results with a normal calcium score however she has been symptomatic with chest pain controlled hypertension abnormal EKG inconclusive treadmill stress and after review of risks benefits and options she will undergo coronary angiography for definitive diagnosis.  She voiced understanding of the discussion.  ASSESSMENT:    1. Chest pain of uncertain etiology   2. HYPERTENSION, MILD   3. LVH (left ventricular hypertrophy)   4. Lower extremity edema   5. Precordial pain    PLAN:    In order of problems listed above:  1. Her chest pain is quite atypical not anginal in nature but increased cardiovascular risk her EKG had T wave changes her stress test was inconclusive with a limited exercise tolerance and hypertensive blood pressure response and in this case I think she is best served with a cardiac CTA to define calcium score presence or absence of CAD and also to look for findings of pulmonary thromboembolism.  I question if she has having toxicity from her Vynase when her blood pressure was so out of control.  She is pending endocrine evaluation with abnormal urine screen for pheochromocytoma 2. Improved for now continue current antihypertensives I am unsure if Cardura is a good choice with her edema is and that it tends to favor peripheral edema and can cause orthostatic lightheadedness. 3. Her previous EKG was quite characteristic of LVH but not present on echocardiogram at today's EKG.  Continue her  current antihypertensives 4. Does not appear to be on the basis of pulmonary hypertension or diastolic dysfunction reassess after CTA and continue her current treatment including thiazide diuretic  Next appointment 6 weeks   Medication Adjustments/Labs and Tests Ordered: Current medicines are reviewed at length with the patient today.  Concerns regarding medicines are outlined above.  Orders Placed This Encounter  Procedures  . CT CORONARY MORPH W/CTA COR W/SCORE W/CA W/CM &/OR WO/CM  . CT CORONARY FRACTIONAL FLOW RESERVE DATA PREP  . CT CORONARY FRACTIONAL FLOW RESERVE FLUID ANALYSIS  . EKG 12-Lead   No orders of the defined types were placed in this encounter.    Chief Complaint  Patient presents with  . Chest Pain  . Leg Swelling  . Hypertension    History of Present Illness:    Caree Wolpert is a 40 y.o. female who is being seen today for the evaluation of several cardiovascular complaints including peripheral edema hypertension and chest pain at the request of Hali Marry, *.  She had an echocardiogram performed 06/15/2020 showing ejection fraction of 60 to 65% she had normal GLS and diastolic parameters the right ventricle was normal and there is no valvular abnormality of concern.  She had a treadmill stress test performed 06/14/2020 which showed a hypertensive blood pressure response no ischemic changes or chest pain was noted exercise duration was 3 minutes and exercise tolerance was diminished at 4.6 METS.  Her EKG showed sinus rhythm LVH voltage  repolarization and was consistent with LVH.  Previous EKG 2019 was normal.  A 24-hour urine showed elevated metanephrine and she was referred to endocrinology.  Catecholamines essential hypertension were normal aldosterone was normal renin activity was normal and aldosterone renin ratio was normal.  She had a D-dimer performed that was elevated 12 days ago 0.81 showed normal renal function and potassium and lipid profile  showed a cholesterol 156 LDL 86 triglycerides 100 HDL of 50.  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 Associated with her elevated blood pressure she had worsened edema but not shortness of breath. She relates a history of chest pain for several years that comes and goes last for hours at a time moderate intensity aching substernal unrelated to activity on relieved with rest but a little worse with a deep inspiration.  She has had no fever chills or cough.  No history of venous thromboembolism.  Her D-dimer was elevated but has been elevated in the past and did not have a repeat evaluation for pulmonary embolism.  She had a CTA of her chest 2015 no findings of pulmonary embolism She has better on her current antihypertensives of atenolol ACE inhibitor thiazide diuretic and Cardura that was recently added.  Although it can cause edema edema preceded and she is improved. Her EKG in my office today shows sinus rhythm and normalization of the T wave inversions. She is at increased cardiovascular risk with longstanding early in life hypertension and history of cancer and chemotherapy. Her stress test is probably best interpreted as inconclusive and I asked her to undergo a cardiac CTA that we will perform in a facilitative fashion also allow [place] to screen again for pulmonary embolism. Today her blood pressure is at target continue current antihypertensives. She transiently was off of her Vynase and she is back on. Past Medical History:  Diagnosis Date  . ADHD 03/01/2011  . ALLERGIC RHINITIS 01/09/2010   Qualifier: Diagnosis of  By: Madilyn Fireman MD, Barnetta Chapel    . Allergy   . Anemia   . Anemia, iron deficiency 09/09/2013  . Asthma   . Bilateral low back pain without sciatica 08/10/2014  . 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 DDD  (degenerative disc disease), lumbar 08/11/2014   L5 and S1 via xrays 07/2014.    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 EXTERNAL HEMORRHOIDS 01/09/2010   Qualifier: Diagnosis of  By: Madilyn Fireman MD, Barnetta Chapel    . Gastric ulcer 09/14/2013   Seen on EGD 08/2013 with [facility]   . Gastroesophageal reflux disease with esophagitis 08/16/2016  . GERD (gastroesophageal reflux disease)   . Hemorrhoids    laser tx   . Herpes simplex virus (HSV) infection   . 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 History of tachycardia 10/22/2016  . 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 Hypertension   . HYPERTENSION, MILD 01/09/2010   Qualifier: Diagnosis of  By: Madilyn Fireman MD, Barnetta Chapel    . Mid back pain 08/10/2014  . Mild mitral regurgitation by prior echocardiogram 11/14/2016  . Mild persistent asthma without complication 7/78/2423  . Muscle spasm of back 08/10/2014  . Obesity   . OSA on CPAP 01/08/2011   Sleep study 01/24/2006.  CPAP 7 cm water pressure.  Mild OSA AHI 8.7.   . Ovarian cyst    left  . Peripheral edema 10/22/2016  . Post chemo evaluation 04/04/2015  . Sleep apnea   . Tachycardia   . Venous stasis dermatitis of both lower extremities 10/23/2016    Past Surgical History:  Procedure Laterality Date  . OMENTECTOMY  2005  . ovary, bilater fallopian tubes removed  2005  . SALPINGOOPHORECTOMY Right     Current Medications: Current Meds  Medication Sig  . atenolol (TENORMIN) 50 MG tablet Take 1 tablet (50 mg total) by mouth daily. (Patient taking differently: Take 50 mg by mouth daily. Take 0.5 tablet (25 mg) daily)  . benazepril-hydrochlorthiazide (LOTENSIN HCT) 20-12.5 MG tablet Take 1 tablet by mouth once daily  . cetirizine (ZYRTEC) 10 MG tablet Take 10 mg by mouth daily.  Marland Kitchen doxazosin (CARDURA) 2 MG tablet Take 1 tablet (2 mg total) by mouth daily.  Marland Kitchen lisdexamfetamine (VYVANSE) 70 MG capsule Take 1 capsule (70 mg total) by mouth  daily before breakfast.  . valACYclovir (VALTREX) 1000 MG tablet Take 1 tablet (1,000 mg total) by mouth 2 (two) times daily.     Allergies:   Ibuprofen, Other, Hydrocodone, Keflex [cephalexin], Codeine, and Tape   Social History   Socioeconomic History  . Marital status: Single    Spouse name: Not on file  . Number of children: Not on file  . Years of education: Not on file  . Highest education level: Not on file  Occupational History  . Not on file  Tobacco Use  . Smoking status: Never Smoker  . Smokeless tobacco: Never Used  Substance and Sexual Activity  . Alcohol use: No  . Drug use: No  . Sexual activity: Yes    Birth control/protection: None  Other Topics Concern  . Not on file  Social History Narrative  . Not on file   Social Determinants of Health   Financial Resource Strain: Not on file  Food Insecurity: Not on file  Transportation Needs: Not on file  Physical Activity: Not on file  Stress: Not on file  Social Connections: Not on file     Family History: The patient's family history includes Diabetes in an other family member; Hypertension in her mother; Other in an other family member.  ROS:   ROS Please see the history of present illness.     All other systems reviewed and are negative.  EKGs/Labs/Other Studies Reviewed:    The following studies were reviewed today:   EKG:  EKG is  ordered today.  The ekg ordered today is personally reviewed and demonstrates sinus rhythm and is normalized the T wave inversions  Recent Labs: 03/28/2020: ALT 22; BUN 16; Creat 0.67; Potassium 3.8; Sodium 139 06/13/2020: Hemoglobin 13.6; Platelets 228; TSH 5.01  Recent Lipid Panel    Component Value Date/Time   CHOL 156 03/28/2020 0000   TRIG 100 03/28/2020 0000   HDL 50 03/28/2020 0000   CHOLHDL 3.1 03/28/2020 0000   VLDL 19 11/04/2015 1021   LDLCALC 86 03/28/2020 0000    Physical Exam:    VS:  BP 126/78   Pulse 81   Ht 5\' 4"  (1.626 m)   Wt 234 lb (106.1  kg)   LMP 05/29/2020 (Exact Date)   SpO2 98%   BMI 40.17 kg/m     Wt Readings from Last 3 Encounters:  06/27/20 234 lb (106.1 kg)  06/07/20 233 lb (105.7 kg)  05/31/20 233 lb (105.7 kg)  GEN: Obese BMI greater than 40 well nourished, well developed in no acute distress HEENT: Normal NECK: No JVD; No carotid bruits LYMPHATICS: No lymphadenopathy CARDIAC: RRR, no murmurs, rubs, gallops RESPIRATORY:  Clear to auscultation without rales, wheezing or rhonchi  ABDOMEN: Soft, non-tender, non-distended MUSCULOSKELETAL: 1+ bilateral lower extremity pitting edema; No deformity  SKIN: Warm and dry NEUROLOGIC:  Alert and oriented x 3 PSYCHIATRIC:  Normal affect     Signed, Shirlee More, MD  06/27/2020 3:14 PM    [facility]

## 2020-06-25 NOTE — H&P (View-Only) (Signed)
Cardiology Office Note:    Date:  06/27/2020   ID:  Yolanda Mosley, DOB Jan 26, 1981, MRN [id number]  PCP:  Yolanda Marry, MD  Cardiologist:  Yolanda More, MD   Referring MD: Yolanda Mosley, *    Today I reviewed the results of her cardiac CTA with Dr. Marlou Mosley. There is an area of soft plaque and what is felt to be moderate or Mosley severe stenosis in the LAD.  She was sent for [facility] but technically that area the artery could not be mild. I called the patient and reviewed her test results with a normal calcium score however she has been symptomatic with chest pain controlled hypertension abnormal EKG inconclusive treadmill stress and after review of risks benefits and options she will undergo coronary angiography for definitive diagnosis.  She voiced understanding of the discussion.  ASSESSMENT:    1. Chest pain of uncertain etiology   2. HYPERTENSION, MILD   3. LVH (left ventricular hypertrophy)   4. Lower extremity edema   5. Precordial pain    PLAN:    In order of problems listed above:  1. Her chest pain is quite atypical not anginal in nature but increased cardiovascular risk her EKG had T wave changes her stress test was inconclusive with a limited exercise tolerance and hypertensive blood pressure response and in this case I think she is best served with a cardiac CTA to define calcium score presence or absence of CAD and also to look for findings of pulmonary thromboembolism.  I question if she has having toxicity from her Vynase when her blood pressure was so out of control.  She is pending endocrine evaluation with abnormal urine screen for pheochromocytoma 2. Improved for now continue current antihypertensives I am unsure if Cardura is a good choice with her edema is and that it tends to favor peripheral edema and can cause orthostatic lightheadedness. 3. Her previous EKG was quite characteristic of LVH but not present on echocardiogram at today's EKG.  Continue her  current antihypertensives 4. Does not appear to be on the basis of pulmonary hypertension or diastolic dysfunction reassess after CTA and continue her current treatment including thiazide diuretic  Next appointment 6 weeks   Medication Adjustments/Labs and Tests Ordered: Current medicines are reviewed at length with the patient today.  Concerns regarding medicines are outlined above.  Orders Placed This Encounter  Procedures  . CT CORONARY MORPH W/CTA COR W/SCORE W/CA W/CM &/OR WO/CM  . CT CORONARY FRACTIONAL FLOW RESERVE DATA PREP  . CT CORONARY FRACTIONAL FLOW RESERVE FLUID ANALYSIS  . EKG 12-Lead   No orders of the defined types were placed in this encounter.    Chief Complaint  Patient presents with  . Chest Pain  . Leg Swelling  . Hypertension    History of Present Illness:    Yolanda Mosley is a 40 y.o. female who is being seen today for the evaluation of several cardiovascular complaints including peripheral edema hypertension and chest pain at the request of Yolanda Mosley, *.  She had an echocardiogram performed 06/15/2020 showing ejection fraction of 60 to 65% she had normal GLS and diastolic parameters the right ventricle was normal and there is no valvular abnormality of concern.  She had a treadmill stress test performed 06/14/2020 which showed a hypertensive blood pressure response no ischemic changes or chest pain was noted exercise duration was 3 minutes and exercise tolerance was diminished at 4.6 METS.  Her EKG showed sinus rhythm LVH voltage  repolarization and was consistent with LVH.  Previous EKG 2019 was normal.  A 24-hour urine showed elevated metanephrine and she was referred to endocrinology.  Catecholamines essential hypertension were normal aldosterone was normal renin activity was normal and aldosterone renin ratio was normal.  She had a D-dimer performed that was elevated 12 days ago 0.81 showed normal renal function and potassium and lipid profile  showed a cholesterol 156 LDL 86 triglycerides 100 HDL of 50.  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 Associated with her elevated blood pressure she had worsened edema but not shortness of breath. She relates a history of chest pain for several years that comes and goes last for hours at a time moderate intensity aching substernal unrelated to activity on relieved with rest but a little worse with a deep inspiration.  She has had no fever chills or cough.  No history of venous thromboembolism.  Her D-dimer was elevated but has been elevated in the past and did not have a repeat evaluation for pulmonary embolism.  She had a CTA of her chest 2015 no findings of pulmonary embolism She has better on her current antihypertensives of atenolol ACE inhibitor thiazide diuretic and Cardura that was recently added.  Although it can cause edema edema preceded and she is improved. Her EKG in my office today shows sinus rhythm and normalization of the T wave inversions. She is at increased cardiovascular risk with longstanding early in life hypertension and history of cancer and chemotherapy. Her stress test is probably best interpreted as inconclusive and I asked her to undergo a cardiac CTA that we will perform in a facilitative fashion also allow [place] to screen again for pulmonary embolism. Today her blood pressure is at target continue current antihypertensives. She transiently was off of her Vynase and she is back on. Past Medical History:  Diagnosis Date  . ADHD 03/01/2011  . ALLERGIC RHINITIS 01/09/2010   Qualifier: Diagnosis of  By: Madilyn Fireman MD, Barnetta Chapel    . Allergy   . Anemia   . Anemia, iron deficiency 09/09/2013  . Asthma   . Bilateral low back pain without sciatica 08/10/2014  . 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 DDD  (degenerative disc disease), lumbar 08/11/2014   L5 and S1 via xrays 07/2014.    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 EXTERNAL HEMORRHOIDS 01/09/2010   Qualifier: Diagnosis of  By: Madilyn Fireman MD, Barnetta Chapel    . Gastric ulcer 09/14/2013   Seen on EGD 08/2013 with [facility]   . Gastroesophageal reflux disease with esophagitis 08/16/2016  . GERD (gastroesophageal reflux disease)   . Hemorrhoids    laser tx   . Herpes simplex virus (HSV) infection   . 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 History of tachycardia 10/22/2016  . 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 Hypertension   . HYPERTENSION, MILD 01/09/2010   Qualifier: Diagnosis of  By: Madilyn Fireman MD, Barnetta Chapel    . Mid back pain 08/10/2014  . Mild mitral regurgitation by prior echocardiogram 11/14/2016  . Mild persistent asthma without complication 6/31/4970  . Muscle spasm of back 08/10/2014  . Obesity   . OSA on CPAP 01/08/2011   Sleep study 01/24/2006.  CPAP 7 cm water pressure.  Mild OSA AHI 8.7.   . Ovarian cyst    left  . Peripheral edema 10/22/2016  . Post chemo evaluation 04/04/2015  . Sleep apnea   . Tachycardia   . Venous stasis dermatitis of both lower extremities 10/23/2016    Past Surgical History:  Procedure Laterality Date  . OMENTECTOMY  2005  . ovary, bilater fallopian tubes removed  2005  . SALPINGOOPHORECTOMY Right     Current Medications: Current Meds  Medication Sig  . atenolol (TENORMIN) 50 MG tablet Take 1 tablet (50 mg total) by mouth daily. (Patient taking differently: Take 50 mg by mouth daily. Take 0.5 tablet (25 mg) daily)  . benazepril-hydrochlorthiazide (LOTENSIN HCT) 20-12.5 MG tablet Take 1 tablet by mouth once daily  . cetirizine (ZYRTEC) 10 MG tablet Take 10 mg by mouth daily.  Marland Kitchen doxazosin (CARDURA) 2 MG tablet Take 1 tablet (2 mg total) by mouth daily.  Marland Kitchen lisdexamfetamine (VYVANSE) 70 MG capsule Take 1 capsule (70 mg total) by mouth  daily before breakfast.  . valACYclovir (VALTREX) 1000 MG tablet Take 1 tablet (1,000 mg total) by mouth 2 (two) times daily.     Allergies:   Ibuprofen, Other, Hydrocodone, Keflex [cephalexin], Codeine, and Tape   Social History   Socioeconomic History  . Marital status: Single    Spouse name: Not on file  . Number of children: Not on file  . Years of education: Not on file  . Highest education level: Not on file  Occupational History  . Not on file  Tobacco Use  . Smoking status: Never Smoker  . Smokeless tobacco: Never Used  Substance and Sexual Activity  . Alcohol use: No  . Drug use: No  . Sexual activity: Yes    Birth control/protection: None  Other Topics Concern  . Not on file  Social History Narrative  . Not on file   Social Determinants of Health   Financial Resource Strain: Not on file  Food Insecurity: Not on file  Transportation Needs: Not on file  Physical Activity: Not on file  Stress: Not on file  Social Connections: Not on file     Family History: The patient's family history includes Diabetes in an other family member; Hypertension in her mother; Other in an other family member.  ROS:   ROS Please see the history of present illness.     All other systems reviewed and are negative.  EKGs/Labs/Other Studies Reviewed:    The following studies were reviewed today:   EKG:  EKG is  ordered today.  The ekg ordered today is personally reviewed and demonstrates sinus rhythm and is normalized the T wave inversions  Recent Labs: 03/28/2020: ALT 22; BUN 16; Creat 0.67; Potassium 3.8; Sodium 139 06/13/2020: Hemoglobin 13.6; Platelets 228; TSH 5.01  Recent Lipid Panel    Component Value Date/Time   CHOL 156 03/28/2020 0000   TRIG 100 03/28/2020 0000   HDL 50 03/28/2020 0000   CHOLHDL 3.1 03/28/2020 0000   VLDL 19 11/04/2015 1021   LDLCALC 86 03/28/2020 0000    Physical Exam:    VS:  BP 126/78   Pulse 81   Ht 5\' 4"  (1.626 m)   Wt 234 lb (106.1  kg)   LMP 05/29/2020 (Exact Date)   SpO2 98%   BMI 40.17 kg/m     Wt Readings from Last 3 Encounters:  06/27/20 234 lb (106.1 kg)  06/07/20 233 lb (105.7 kg)  05/31/20 233 lb (105.7 kg)  GEN: Obese BMI greater than 40 well nourished, well developed in no acute distress HEENT: Normal NECK: No JVD; No carotid bruits LYMPHATICS: No lymphadenopathy CARDIAC: RRR, no murmurs, rubs, gallops RESPIRATORY:  Clear to auscultation without rales, wheezing or rhonchi  ABDOMEN: Soft, non-tender, non-distended MUSCULOSKELETAL: 1+ bilateral lower extremity pitting edema; No deformity  SKIN: Warm and dry NEUROLOGIC:  Alert and oriented x 3 PSYCHIATRIC:  Normal affect     Signed, Yolanda More, MD  06/27/2020 3:14 PM    [facility]

## 2020-06-27 ENCOUNTER — Other Ambulatory Visit: Payer: Self-pay

## 2020-06-27 ENCOUNTER — Encounter: Payer: Self-pay | Admitting: Cardiology

## 2020-06-27 ENCOUNTER — Ambulatory Visit: Payer: BC Managed Care – PPO | Admitting: Cardiology

## 2020-06-27 VITALS — BP 126/78 | HR 81 | Ht 64.0 in | Wt 234.0 lb

## 2020-06-27 DIAGNOSIS — R072 Precordial pain: Secondary | ICD-10-CM

## 2020-06-27 DIAGNOSIS — I517 Cardiomegaly: Secondary | ICD-10-CM

## 2020-06-27 DIAGNOSIS — R600 Localized edema: Secondary | ICD-10-CM | POA: Diagnosis not present

## 2020-06-27 DIAGNOSIS — R079 Chest pain, unspecified: Secondary | ICD-10-CM

## 2020-06-27 DIAGNOSIS — I10 Essential (primary) hypertension: Secondary | ICD-10-CM

## 2020-06-27 DIAGNOSIS — R931 Abnormal findings on diagnostic imaging of heart and coronary circulation: Secondary | ICD-10-CM

## 2020-06-27 MED ORDER — BENAZEPRIL-HYDROCHLOROTHIAZIDE 20-12.5 MG PO TABS: 1.0000 | ORAL_TABLET | Freq: Every day | ORAL | 2 refills | Status: DC

## 2020-06-27 NOTE — Addendum Note (Signed)
Addended by: Truddie Hidden on: 06/27/2020 03:17 PM   Modules accepted: Orders

## 2020-06-27 NOTE — Patient Instructions (Signed)
Medication Instructions:  No medication changes. *If you need a refill on your cardiac medications before your next appointment, please call your pharmacy*   Lab Work: None ordered If you have labs (blood work) drawn today and your tests are completely normal, you will receive your results only by: Marland Kitchen MyChart Message (if you have MyChart) OR . A paper copy in the mail If you have any lab test that is abnormal or we need to change your treatment, we will call you to review the results.   Testing/Procedures: Your cardiac CT will be scheduled at:   [facility] [place], [place] [address] [phone]   If scheduled at [facility], please arrive at the [facility] main entrance of [facility] 30 minutes prior to test start time. Proceed to the [facility] Radiology Department (first floor) to check-in and test prep.  Please follow these instructions carefully (unless otherwise directed):   On the Night Before the Test: . Be sure to Drink plenty of water. . Do not consume any caffeinated/decaffeinated beverages or chocolate 12 hours prior to your test. . Do not take any antihistamines 12 hours prior to your test.   On the Day of the Test: . Drink plenty of water. Do not drink any water within one hour of the test. . Do not eat any food 4 hours prior to the test. . You may take your regular medications prior to the test.  . Take atenolol (tenormin ) two hours prior to test. . FEMALES- please wear underwire-free bra if available   After the Test: . Drink plenty of water. . After receiving IV contrast, you may experience a mild flushed feeling. This is normal. . On occasion, you may experience a mild rash up to 24 hours after the test. This is not dangerous. If this occurs, you can take Benadryl 25 mg and increase your fluid intake. . If you experience trouble breathing, this can be serious. If it is severe call 911 IMMEDIATELY. If it is  mild, please call our office.    Once we have confirmed authorization from your insurance company, we will call you to set up a date and time for your test. Based on how quickly your insurance processes prior authorizations requests, please allow up to 4 weeks to be contacted for scheduling your Cardiac CT appointment. Be advised that routine Cardiac CT appointments could be scheduled as many as 8 weeks after your provider has ordered it.  For non-scheduling related questions, please contact the cardiac imaging nurse navigator should you have any questions/concerns: Marchia Bond, Cardiac Imaging Nurse Navigator Burley Saver, Interim Cardiac Imaging Nurse [place] [facility] Direct Office Dial: [phone]   For scheduling needs, including cancellations and rescheduling, please call Vivien Rota at [phone].     Follow-Up: At [facility], you and your health needs are our priority.  As part of our continuing mission to provide you with exceptional heart care, we have created designated Provider Care Teams.  These Care Teams include your primary Cardiologist (physician) and Advanced Practice Providers (APPs -  Physician Assistants and Nurse Practitioners) who all work together to provide you with the care you need, when you need it.  We recommend signing up for the patient portal called "MyChart".  Sign up information is provided on this After Visit Summary.  MyChart is used to connect with patients for Virtual Visits (Telemedicine).  Patients are able to view lab/test results, encounter  notes, upcoming appointments, etc.  Non-urgent messages can be sent to your provider as well.   To learn more about what you can do with MyChart, go to [email or web address].    Your next appointment:   6 week(s)  The format for your next appointment:   In Person  Provider:   Shirlee More, MD   Other Instructions Cardiac CT Angiogram A cardiac CT angiogram is a procedure to  look at the heart and the area around the heart. It may be done to help find the cause of chest pains or other symptoms of heart disease. During this procedure, a substance called contrast dye is injected into the blood vessels in the area to be checked. A large X-ray machine, called a CT scanner, then takes detailed pictures of the heart and the surrounding area. The procedure is also sometimes called a coronary CT angiogram, coronary artery scanning, or CTA. A cardiac CT angiogram allows the health care provider to see how well blood is flowing to and from the heart. The health care provider will be able to see if there are any problems, such as:  Blockage or narrowing of the coronary arteries in the heart.  Fluid around the heart.  Signs of weakness or disease in the muscles, valves, and tissues of the heart. Tell a health care provider about:  Any allergies you have. This is especially important if you have had a previous allergic reaction to contrast dye.  All medicines you are taking, including vitamins, herbs, eye drops, creams, and over-the-counter medicines.  Any blood disorders you have.  Any surgeries you have had.  Any medical conditions you have.  Whether you are pregnant or may be pregnant.  Any anxiety disorders, chronic pain, or other conditions you have that may increase your stress or prevent you from lying still. What are the risks? Generally, this is a safe procedure. However, problems may occur, including: 1. Bleeding. 2. Infection. 3. Allergic reactions to medicines or dyes. 4. Damage to other structures or organs. 5. Kidney damage from the contrast dye that is used. 6. Increased risk of cancer from radiation exposure. This risk is low. Talk with your health care provider about: ? The risks and benefits of testing. ? How you can receive the lowest dose of radiation. What happens before the procedure? 1. Wear comfortable clothing and remove any jewelry, glasses,  dentures, and hearing aids. 2. Follow instructions from your health care provider about eating and drinking. This may include: ? For 12 hours before the procedure -- avoid caffeine. This includes tea, coffee, soda, energy drinks, and diet pills. Drink plenty of water or other fluids that do not have caffeine in them. Being well hydrated can prevent complications. ? For 4-6 hours before the procedure -- stop eating and drinking. The contrast dye can cause nausea, but this is less likely if your stomach is empty. 3. Ask your health care provider about changing or stopping your regular medicines. This is especially important if you are taking diabetes medicines, blood thinners, or medicines to treat problems with erections (erectile dysfunction). What happens during the procedure?  1. Hair on your chest may need to be removed so that small sticky patches called electrodes can be placed on your chest. These will transmit information that helps to monitor your heart during the procedure. 2. An IV will be inserted into one of your veins. 3. You might be given a medicine to control your heart rate during the procedure. This will help  to ensure that good images are obtained. 4. You will be asked to lie on an exam table. This table will slide in and out of the CT machine during the procedure. 5. Contrast dye will be injected into the IV. You might feel warm, or you may get a metallic taste in your mouth. 6. You will be given a medicine called nitroglycerin. This will relax or dilate the arteries in your heart. 7. The table that you are lying on will move into the CT machine tunnel for the scan. 8. The person running the machine will give you instructions while the scans are being done. You may be asked to: ? Keep your arms above your head. ? Hold your breath. ? Stay very still, even if the table is moving. 9. When the scanning is complete, you will be moved out of the machine. 10. The IV will be  removed. The procedure may vary among health care providers and hospitals. What can I expect after the procedure? After your procedure, it is common to have:  A metallic taste in your mouth from the contrast dye.  A feeling of warmth.  A headache from the nitroglycerin. Follow these instructions at home:  Take over-the-counter and prescription medicines only as told by your health care provider.  If you are told, drink enough fluid to keep your urine pale yellow. This will help to flush the contrast dye out of your body.  Most people can return to their normal activities right after the procedure. Ask your health care provider what activities are safe for you.  It is up to you to get the results of your procedure. Ask your health care provider, or the department that is doing the procedure, when your results will be ready.  Keep all follow-up visits as told by your health care provider. This is important. Contact a health care provider if: 1. You have any symptoms of allergy to the contrast dye. These include: ? Shortness of breath. ? Rash or hives. ? A racing heartbeat. Summary  A cardiac CT angiogram is a procedure to look at the heart and the area around the heart. It may be done to help find the cause of chest pains or other symptoms of heart disease.  During this procedure, a large X-ray machine, called a CT scanner, takes detailed pictures of the heart and the surrounding area after a contrast dye has been injected into blood vessels in the area.  Ask your health care provider about changing or stopping your regular medicines before the procedure. This is especially important if you are taking diabetes medicines, blood thinners, or medicines to treat erectile dysfunction.  If you are told, drink enough fluid to keep your urine pale yellow. This will help to flush the contrast dye out of your body. This information is not intended to replace advice given to you by your health  care provider. Make sure you discuss any questions you have with your health care provider. Document Revised: 12/10/2018 Document Reviewed: 12/10/2018 Elsevier Patient Education  [place].

## 2020-07-04 ENCOUNTER — Telehealth (HOSPITAL_COMMUNITY): Payer: Self-pay | Admitting: Emergency Medicine

## 2020-07-04 NOTE — Telephone Encounter (Signed)
Pt returning phone call regarding upcoming cardiac imaging study; pt verbalizes understanding of appt date/time, parking situation and where to check in, pre-test NPO status and medications ordered, and verified current allergies; name and call back number provided for further questions should they arise Albertia Carvin RN Navigator Cardiac Imaging [facility] [phone] office [phone] cell   

## 2020-07-04 NOTE — Telephone Encounter (Signed)
Attempted to call patient regarding upcoming cardiac CT appointment. °Left message on voicemail with name and callback number °Karsen Fellows RN Navigator Cardiac Imaging °[facility] °[phone] Office °[phone] Cell ° °

## 2020-07-06 ENCOUNTER — Ambulatory Visit (HOSPITAL_COMMUNITY)
Admission: RE | Admit: 2020-07-06 | Discharge: 2020-07-06 | Disposition: A | Discharge status: Home / Self Care | Payer: BC Managed Care – PPO | Source: Ambulatory Visit | Attending: Cardiology | Admitting: Cardiology

## 2020-07-06 ENCOUNTER — Other Ambulatory Visit: Payer: Self-pay

## 2020-07-06 ENCOUNTER — Encounter: Payer: BC Managed Care – PPO | Admitting: *Deleted

## 2020-07-06 DIAGNOSIS — R079 Chest pain, unspecified: Secondary | ICD-10-CM | POA: Diagnosis present

## 2020-07-06 DIAGNOSIS — I517 Cardiomegaly: Secondary | ICD-10-CM | POA: Diagnosis not present

## 2020-07-06 DIAGNOSIS — R072 Precordial pain: Secondary | ICD-10-CM

## 2020-07-06 DIAGNOSIS — Z006 Encounter for examination for normal comparison and control in clinical research program: Secondary | ICD-10-CM

## 2020-07-06 IMAGING — CT CT HEART MORP W/ CTA COR W/ SCORE W/ CA W/CM &/OR W/O CM
1 series · 2 of 4 positions shown, 3 images · non-contrast
Comparison: None.
COMPARISON: None.

Addendum:
EXAM:
OVER-READ INTERPRETATION  CT CHEST

The following report is an over-read performed by radiologist Dr.
Katsy Chiappe [REDACTED] on 07/06/2020. This
over-read does not include interpretation of cardiac or coronary
anatomy or pathology. The coronary calcium score/coronary CTA
interpretation by the cardiologist is attached.
CLINICAL DATA: 39 year old female with chest pain, abnormal ECG.
Cardiac/Coronary  CTA
TECHNIQUE: The patient was scanned on a Phillips Force scanner.

[Series 1112: — · 0.39mm/px · 2 of 4 slices shown, 3 images]
[im 2/4  vessel]
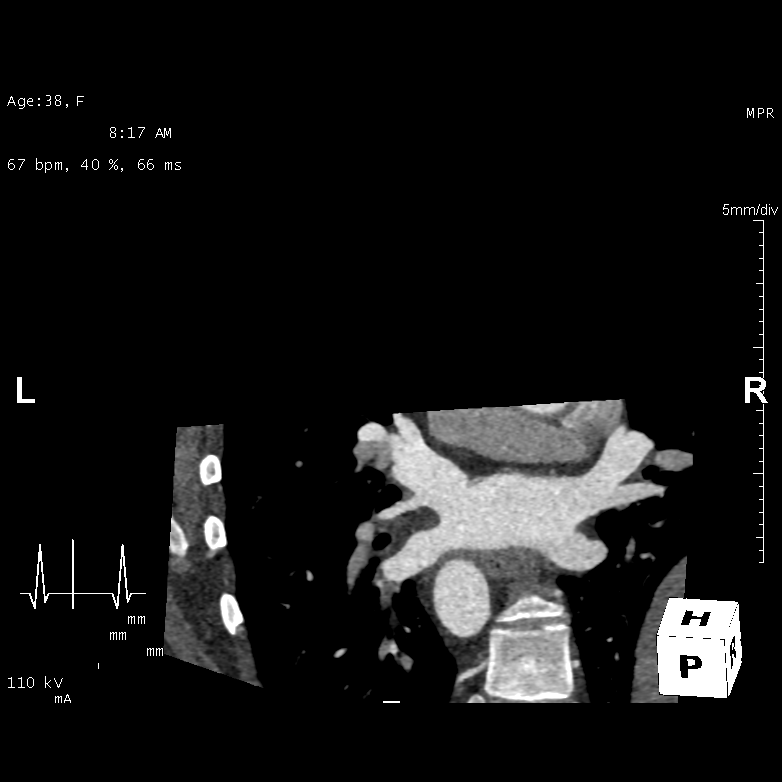
[im 2/4  lung]
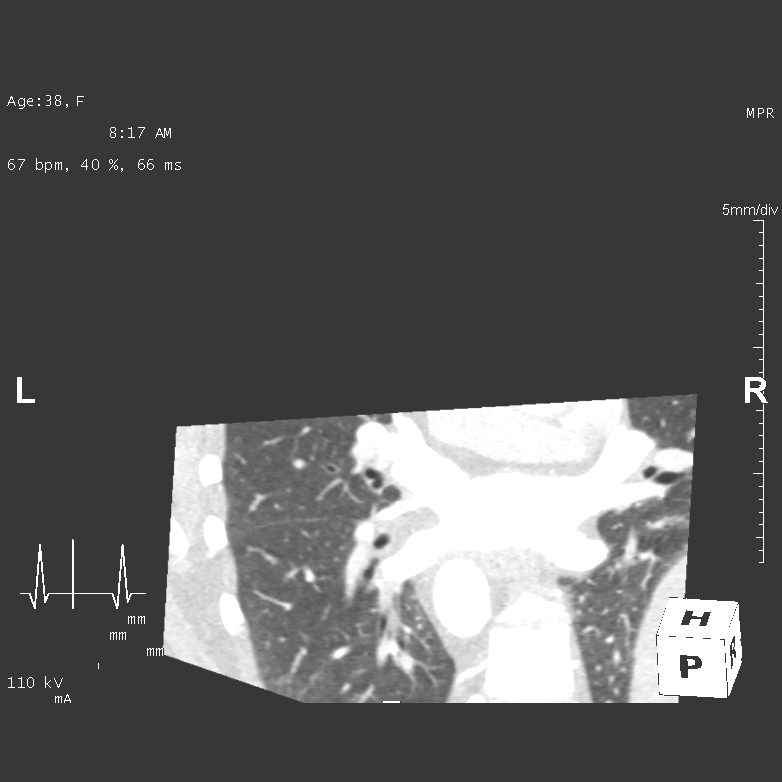
[im 3/4  vessel]
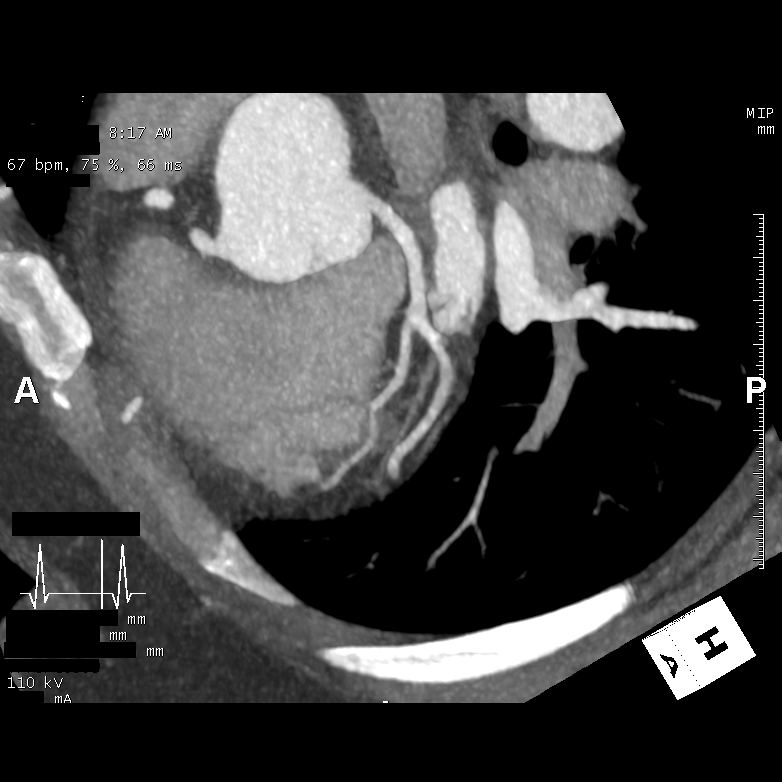

[2 of 4 positions shown; findings below may reference images not displayed]

FINDINGS: Within the visualized portions of the thorax there are no suspicious
appearing pulmonary nodules or masses, there is no acute
consolidative airspace disease, no pleural effusions, no
pneumothorax and no lymphadenopathy. Visualized portions of the
upper abdomen are unremarkable. There are no aggressive appearing
lytic or blastic lesions noted in the visualized portions of the
skeleton.
IMPRESSION: 1. No significant incidental noncardiac findings are noted.
FINDINGS: A 110 kV prospective scan was triggered in the descending thoracic
aorta at 111 HU's. Axial non-contrast 3 mm slices were carried out
through the heart. The data set was analyzed on a dedicated work
station and scored using the Agatson method. Gantry rotation speed
was 250 msecs and collimation was .6 mm. No beta blockade and 0.8 mg
of sl NTG was given. The 3D data set was reconstructed in 5%
intervals of the 67-82 % of the R-R cycle. Diastolic phases were
analyzed on a dedicated work station using MPR, MIP and VRT modes.
The patient received 80 cc of contrast.

Aorta:  Normal size.  No calcifications.  No dissection.

Aortic Valve:  Trileaflet.  No calcifications.

Coronary Arteries:  Normal coronary origin.  Right dominance.

RCA is a large dominant artery that gives rise to PDA and PLA. There
is no plaque.

Left main is a large artery that gives rise to LAD and LCX arteries.

There is a mid LAD stenosis of that appears 75%, soft plaque at
angulation of vessel. Sending for FFR. May represent poor filling of
mid to distal vessel.

LCX is a non-dominant artery that gives rise to one large OM1
branch. There is no plaque.

Other findings:

Normal pulmonary vein drainage into the left atrium.

Normal left atrial appendage without a thrombus.

Normal size of the pulmonary artery.

Please see radiology report for non cardiac findings.
IMPRESSION: 1. Coronary calcium score of 0. This was 0 percentile for age and
sex matched control.

2. Normal coronary origin with right dominance.

3. There is a mid LAD stenosis of that appears 75% stenosis, soft
plaque. Sending for FFR. May represent poor filling of mid to distal
vessel.

*** End of Addendum ***
EXAM:
OVER-READ INTERPRETATION  CT CHEST

The following report is an over-read performed by radiologist Dr.
Katsy Chiappe [REDACTED] on 07/06/2020. This
over-read does not include interpretation of cardiac or coronary
anatomy or pathology. The coronary calcium score/coronary CTA
interpretation by the cardiologist is attached.
FINDINGS: Within the visualized portions of the thorax there are no suspicious
appearing pulmonary nodules or masses, there is no acute
consolidative airspace disease, no pleural effusions, no
pneumothorax and no lymphadenopathy. Visualized portions of the
upper abdomen are unremarkable. There are no aggressive appearing
lytic or blastic lesions noted in the visualized portions of the
skeleton.
IMPRESSION: 1. No significant incidental noncardiac findings are noted.

## 2020-07-06 MED ORDER — IOHEXOL 350 MG/ML SOLN
80.0000 mL | Freq: Once | INTRAVENOUS | Status: AC | PRN
  Administered 2020-07-06: 80 mL via INTRAVENOUS

## 2020-07-06 MED ORDER — NITROGLYCERIN 0.4 MG SL SUBL: 0.8000 mg | SUBLINGUAL_TABLET | Freq: Once | SUBLINGUAL | Status: AC

## 2020-07-06 MED ORDER — NITROGLYCERIN 0.4 MG SL SUBL
SUBLINGUAL_TABLET | SUBLINGUAL | Status: AC
  Administered 2020-07-06: 0.8 mg via SUBLINGUAL
  Filled 2020-07-06: qty 2

## 2020-07-06 NOTE — Research (Signed)
IDENTIFY Informed Consent   Subject Name: Yolanda Mosley  Subject met inclusion and exclusion criteria.  The informed consent form, study requirements and expectations were reviewed with the subject and questions and concerns were addressed prior to the signing of the consent form.  The subject verbalized understanding of the trial requirements.  The subject agreed to participate in the IDENTIFY trial and signed the informed consent at 0658 on 07/06/2020.  The informed consent was obtained prior to performance of any protocol-specific procedures for the subject.  A copy of the signed informed consent was given to the subject and a copy was placed in the subject's medical record.   Philemon Kingdom D

## 2020-07-07 DIAGNOSIS — R072 Precordial pain: Secondary | ICD-10-CM | POA: Diagnosis not present

## 2020-07-08 ENCOUNTER — Telehealth: Payer: Self-pay | Admitting: Cardiology

## 2020-07-08 ENCOUNTER — Other Ambulatory Visit: Payer: Self-pay

## 2020-07-08 ENCOUNTER — Telehealth: Payer: Self-pay

## 2020-07-08 DIAGNOSIS — I10 Essential (primary) hypertension: Secondary | ICD-10-CM

## 2020-07-08 NOTE — Telephone Encounter (Signed)
Patient was getting labs drawn and wanted to know about her Cath that was scheduled today. I called Lilia Pro to inquire about this. She had instructions already typed up for the patient. I went over the instructions with the patient and answered questions she had. She does not have anymore questions at this time.

## 2020-07-08 NOTE — Telephone Encounter (Signed)
I called Dr. Marlou Porch to review the results of the CTA with him.  She has apparent stenosis in the LAD that he thought was potentially severe and wanted to do FFR.  Technically the FFR was in adequate and provides no additional information.  I waited till I spoke with him to call Yolanda Mosley with the results.  Other than that her coronary arteries are normal and she has a calcium score of 0.

## 2020-07-08 NOTE — Addendum Note (Signed)
Addended by: Shirlee More on: 07/08/2020 02:42 PM   Modules accepted: Orders, SmartSet

## 2020-07-08 NOTE — Telephone Encounter (Signed)
Called patient about her message. Informed patient that as soon as Dr. Bettina Gavia results her cardiac CT, his nurse should call her with results. Patient verbalized understanding.

## 2020-07-08 NOTE — Telephone Encounter (Signed)
Follow Up:     Pt is calling again, she wants to know if her CT results are ready? She would like to know something asap please.

## 2020-07-09 ENCOUNTER — Telehealth: Payer: Self-pay | Admitting: Physician Assistant

## 2020-07-09 LAB — BASIC METABOLIC PANEL
BUN/Creatinine Ratio: 23 (ref 9–23)
BUN: 15 mg/dL (ref 6–20)
CO2: 23 mmol/L (ref 20–29)
Calcium: 9.7 mg/dL (ref 8.7–10.2)
Chloride: 101 mmol/L (ref 96–106)
Creatinine, Ser: 0.65 mg/dL (ref 0.57–1.00)
Glucose: 95 mg/dL (ref 65–99)
Potassium: 4.1 mmol/L (ref 3.5–5.2)
Sodium: 140 mmol/L (ref 134–144)
eGFR: 115 mL/min/{1.73_m2} (ref >59)

## 2020-07-09 LAB — CBC
Hematocrit: 41.3 % (ref 34.0–46.6)
Hemoglobin: 13.9 g/dL (ref 11.1–15.9)
MCH: 29.2 pg (ref 26.6–33.0)
MCHC: 33.7 g/dL (ref 31.5–35.7)
MCV: 87 fL (ref 79–97)
Platelets: 216 10*3/uL (ref 150–450)
RBC: 4.76 x10E6/uL (ref 3.77–5.28)
RDW: 13.1 % (ref 11.7–15.4)
WBC: 5.8 10*3/uL (ref 3.4–10.8)

## 2020-07-09 NOTE — Telephone Encounter (Signed)
Received a call from the answering service regarding chest pain that started earlier this morning at 8 AM while shopping.  Chest pain is constant and severe with associated lower blood pressure with SBP 80s to 90s.    On review of chart, patient is scheduled for LHC with Dr. Onnie Graham this upcoming Thursday, 07/14/2020 2/2 results of cardiac CT that showed an area of soft plaque and what was felt to be moderate or severe stenosis in the LAD.  FFR was performed but could not be determined.  She has history of abnormal EKG and inconclusive MPI with CP and HTN.   Cardiac CTA was discussed with Dr. Marlou Porch with recommendation for diagnostic LHC with angiography this Thursday.  Given the patient's current chest pain and low BP, recommendation was to present to the nearest emergency department as soon as possible.    She expressed her understanding and is agreeable to this plan.  Instructed the patient to ensure she at least obtains cardiac enzymes and an EKG after presenting to the ED.

## 2020-07-11 ENCOUNTER — Other Ambulatory Visit: Payer: Self-pay

## 2020-07-11 ENCOUNTER — Telehealth: Payer: Self-pay

## 2020-07-11 ENCOUNTER — Telehealth: Payer: Self-pay | Admitting: Cardiology

## 2020-07-11 MED ORDER — DILTIAZEM HCL ER COATED BEADS 120 MG PO CP24: 120.0000 mg | ORAL_CAPSULE | Freq: Every day | ORAL | 3 refills | Status: DC

## 2020-07-11 NOTE — Telephone Encounter (Signed)
Spoke with patient regarding results and recommendation.  Patient verbalizes understanding and is agreeable to plan of care. Advised patient to call back with any issues or concerns.  

## 2020-07-11 NOTE — Telephone Encounter (Signed)
-----   Message from Richardo Priest, MD sent at 07/09/2020  8:54 AM EST ----- Good result precardiac catheterization

## 2020-07-11 NOTE — Telephone Encounter (Signed)
Unable to contact RN for transfer. Made patient aware clinical staff will contact her with advisement shortly.

## 2020-07-11 NOTE — Telephone Encounter (Signed)
Spoke with patient about her symptoms. She states she was sore Sunday morning. Last night her left shoulder, neck, and chest were numb. Blood pressure is normal now. Patient will start Cardizem 120 mg one daily and Asprin 81 mg per Dr. Joya Gaskins orders. Patient verbalized understanding.

## 2020-07-11 NOTE — Telephone Encounter (Signed)
Pt c/o of Chest Pain: STAT if CP now or developed within 24 hours  1. Are you having CP right now?  Yes, but patient states it is mild.  Patient states she's been having numbness along her left side that radiates from the left side of her jaw to her left shoulder and side of chest   2. Are you experiencing any other symptoms (ex. SOB, nausea, vomiting, sweating)? Headaches, dizziness, numbness in chest/neck/jaw/shoulder  3. How long have you been experiencing CP? Since 07/08/20  4. Is your CP continuous or coming and going? Continuous   5. Have you taken Nitroglycerin? No  ?  STAT if patient feels like he/she is going to faint   1) Are you dizzy now? No, dizziness was on 07/08/20 and 07/09/20  2) Do you feel faint or have you passed out? No   3) Do you have any other symptoms? Headaches, numbness in chest/neck/jaw/shoulder   4) Have you checked your HR and BP (record if available)?  07/09/20: 93/53 07/11/20: 148/86

## 2020-07-12 ENCOUNTER — Telehealth: Payer: Self-pay | Admitting: *Deleted

## 2020-07-12 ENCOUNTER — Telehealth: Payer: Self-pay | Admitting: Cardiology

## 2020-07-12 ENCOUNTER — Telehealth: Payer: Self-pay

## 2020-07-12 ENCOUNTER — Other Ambulatory Visit (HOSPITAL_COMMUNITY)
Admission: RE | Admit: 2020-07-12 | Discharge: 2020-07-12 | Disposition: A | Discharge status: Home / Self Care | Payer: BC Managed Care – PPO | Source: Ambulatory Visit | Attending: Cardiovascular Disease | Admitting: Cardiovascular Disease

## 2020-07-12 DIAGNOSIS — I10 Essential (primary) hypertension: Secondary | ICD-10-CM | POA: Diagnosis not present

## 2020-07-12 DIAGNOSIS — Z01812 Encounter for preprocedural laboratory examination: Secondary | ICD-10-CM | POA: Insufficient documentation

## 2020-07-12 DIAGNOSIS — Z20822 Contact with and (suspected) exposure to covid-19: Secondary | ICD-10-CM | POA: Insufficient documentation

## 2020-07-12 DIAGNOSIS — Z885 Allergy status to narcotic agent status: Secondary | ICD-10-CM | POA: Diagnosis not present

## 2020-07-12 DIAGNOSIS — R600 Localized edema: Secondary | ICD-10-CM | POA: Diagnosis not present

## 2020-07-12 DIAGNOSIS — Z79899 Other long term (current) drug therapy: Secondary | ICD-10-CM | POA: Diagnosis not present

## 2020-07-12 DIAGNOSIS — Z881 Allergy status to other antibiotic agents status: Secondary | ICD-10-CM | POA: Diagnosis not present

## 2020-07-12 DIAGNOSIS — Z888 Allergy status to other drugs, medicaments and biological substances status: Secondary | ICD-10-CM | POA: Diagnosis not present

## 2020-07-12 DIAGNOSIS — R079 Chest pain, unspecified: Secondary | ICD-10-CM | POA: Diagnosis not present

## 2020-07-12 LAB — SARS CORONAVIRUS 2 (TAT 6-24 HRS): SARS Coronavirus 2: NEGATIVE

## 2020-07-12 NOTE — Telephone Encounter (Signed)
Pt called in and stated she is having a procedure on 3/17 and is confused as to which medication she is suppose to take before the procedure that morning.  She needs to clarify before Thursday.    She stated that her employer faxed over short term leave paperwork.  She just wanted to get provider heads up that it was faxed over.    Best number [phone]

## 2020-07-12 NOTE — Telephone Encounter (Signed)
Spoke to the patient just now. I received a message from Desiree Lucy, RN that the patient was wanting to know if we received her disability paperwork. I advised that when I spoke with the patient earlier this morning I told her that Dr. Bettina Gavia would only be taking her out for the day of the cardiac catheterization. I called the patient back again just now and she verbalizes understanding and states " I thought I would just take a week off to be safe". I advised that Dr. Bettina Gavia would give her a note for Thursday and Friday but that is all. The disability paperwork will not be filled out.  She verbalizes understanding.

## 2020-07-12 NOTE — Telephone Encounter (Addendum)
Pt contacted pre-catheterization scheduled at [facility] for: Thursday July 14, 2020 7:30 AM Verified arrival time and place: [place] [facility]) at: 5:30 AM   No solid food after midnight prior to cath, clear liquids until 5 AM day of procedure.  Hold: Benazepril-HCTZ-AM of procedure  Except hold medication AM meds can be  taken pre-cath with sips of water including: ASA 81 mg   Confirmed patient has responsible adult to drive home post procedure and be with patient first 24 hours after arriving home: yes  You are allowed ONE visitor in the waiting room during the time you are at the hospital for your procedure. Both you and your visitor must wear a mask once you enter the hospital.   Reviewed procedure/mask/visitor instructions with patient.  Pt states she tested positive for COVID-19 on 05/08/2020 at medical clinic in [facility] and has documentation of positive result. Pt states when she went for pre-procedure COVID-19 test this morning, she was asked if she had tested positive for COVID in the past, told them she tested positive 05/08/20,  they went ahead and did COVID-19 test.

## 2020-07-12 NOTE — Telephone Encounter (Signed)
Spoke to the patient just now and let her know that she can hold her zyrtec prior to her cardiac catheterization. She states that she is having short term leave paperwork sent over to [place] because she was told that she may need to be out of work for 4 weeks after her cardiac catheterization. I advised her that Dr. Bettina Gavia said at this time she does not need to be out of work and he has not advised her to be out of work at any point in time. She verbalizes understanding and thanks me for the call back.

## 2020-07-13 ENCOUNTER — Telehealth: Payer: Self-pay

## 2020-07-13 NOTE — Telephone Encounter (Signed)
Spoke to the patient just now. She states that she is having numbness on her left side. This numbness is in her jaw, arm, and leg. She states that it feels cold all over as well. She wants to know if she needs to go to the ED at this time. I advised her that at this time she needs to proceed to the ED so that they can evaluate her at this time. She verbalizes understanding and does not have any further questions.

## 2020-07-13 NOTE — Telephone Encounter (Signed)
07/12/20 pre-procedure COVID-19 test negative.

## 2020-07-14 ENCOUNTER — Other Ambulatory Visit: Payer: Self-pay

## 2020-07-14 ENCOUNTER — Ambulatory Visit (HOSPITAL_COMMUNITY)
Admission: RE | Disposition: A | Discharge status: Home / Self Care | Payer: Self-pay | Source: Home / Self Care | Attending: Cardiovascular Disease

## 2020-07-14 ENCOUNTER — Ambulatory Visit (HOSPITAL_COMMUNITY)
Admission: RE | Admit: 2020-07-14 | Discharge: 2020-07-14 | Disposition: A | Discharge status: Home / Self Care | Payer: BC Managed Care – PPO | Attending: Cardiovascular Disease | Admitting: Cardiovascular Disease

## 2020-07-14 ENCOUNTER — Encounter (HOSPITAL_COMMUNITY): Payer: Self-pay | Admitting: Cardiovascular Disease

## 2020-07-14 DIAGNOSIS — R079 Chest pain, unspecified: Secondary | ICD-10-CM | POA: Diagnosis not present

## 2020-07-14 DIAGNOSIS — Z881 Allergy status to other antibiotic agents status: Secondary | ICD-10-CM | POA: Insufficient documentation

## 2020-07-14 DIAGNOSIS — Z885 Allergy status to narcotic agent status: Secondary | ICD-10-CM | POA: Insufficient documentation

## 2020-07-14 DIAGNOSIS — I10 Essential (primary) hypertension: Secondary | ICD-10-CM | POA: Insufficient documentation

## 2020-07-14 DIAGNOSIS — Z888 Allergy status to other drugs, medicaments and biological substances status: Secondary | ICD-10-CM | POA: Insufficient documentation

## 2020-07-14 DIAGNOSIS — R931 Abnormal findings on diagnostic imaging of heart and coronary circulation: Secondary | ICD-10-CM | POA: Diagnosis not present

## 2020-07-14 DIAGNOSIS — R600 Localized edema: Secondary | ICD-10-CM | POA: Insufficient documentation

## 2020-07-14 DIAGNOSIS — Z79899 Other long term (current) drug therapy: Secondary | ICD-10-CM | POA: Insufficient documentation

## 2020-07-14 DIAGNOSIS — Z20822 Contact with and (suspected) exposure to covid-19: Secondary | ICD-10-CM | POA: Insufficient documentation

## 2020-07-14 HISTORY — PX: LEFT HEART CATH AND CORONARY ANGIOGRAPHY: CATH118249

## 2020-07-14 SURGERY — LEFT HEART CATH AND CORONARY ANGIOGRAPHY
Anesthesia: LOCAL

## 2020-07-14 MED ORDER — SODIUM CHLORIDE 0.9% FLUSH: 3.0000 mL | Freq: Two times a day (BID) | INTRAVENOUS | Status: DC

## 2020-07-14 MED ORDER — SODIUM CHLORIDE 0.9 % WEIGHT BASED INFUSION: 1.0000 mL/kg/h | INTRAVENOUS | Status: DC

## 2020-07-14 MED ORDER — SODIUM CHLORIDE 0.9 % WEIGHT BASED INFUSION
3.0000 mL/kg/h | INTRAVENOUS | Status: AC
  Administered 2020-07-14: 3 mL/kg/h via INTRAVENOUS

## 2020-07-14 MED ORDER — DIAZEPAM 5 MG PO TABS: 5.0000 mg | ORAL_TABLET | Freq: Four times a day (QID) | ORAL | Status: DC | PRN

## 2020-07-14 MED ORDER — VERAPAMIL HCL 2.5 MG/ML IV SOLN
INTRAVENOUS | Status: AC
  Filled 2020-07-14: qty 2

## 2020-07-14 MED ORDER — HEPARIN (PORCINE) IN NACL 1000-0.9 UT/500ML-% IV SOLN
INTRAVENOUS | Status: DC | PRN
  Administered 2020-07-14 (×2): 500 mL

## 2020-07-14 MED ORDER — HEPARIN SODIUM (PORCINE) 1000 UNIT/ML IJ SOLN
INTRAMUSCULAR | Status: DC | PRN
  Administered 2020-07-14: 5000 [IU] via INTRAVENOUS

## 2020-07-14 MED ORDER — ASPIRIN 81 MG PO CHEW: 81.0000 mg | CHEWABLE_TABLET | ORAL | Status: DC

## 2020-07-14 MED ORDER — MIDAZOLAM HCL 2 MG/2ML IJ SOLN
INTRAMUSCULAR | Status: AC
  Filled 2020-07-14: qty 2

## 2020-07-14 MED ORDER — SODIUM CHLORIDE 0.9 % IV SOLN: 250.0000 mL | INTRAVENOUS | Status: DC | PRN

## 2020-07-14 MED ORDER — HEPARIN (PORCINE) IN NACL 1000-0.9 UT/500ML-% IV SOLN
INTRAVENOUS | Status: AC
  Filled 2020-07-14: qty 1000

## 2020-07-14 MED ORDER — ACETAMINOPHEN 325 MG PO TABS: 650.0000 mg | ORAL_TABLET | ORAL | Status: DC | PRN

## 2020-07-14 MED ORDER — FENTANYL CITRATE (PF) 100 MCG/2ML IJ SOLN
INTRAMUSCULAR | Status: DC | PRN
  Administered 2020-07-14: 25 ug via INTRAVENOUS
  Administered 2020-07-14: 50 ug via INTRAVENOUS

## 2020-07-14 MED ORDER — MIDAZOLAM HCL 2 MG/2ML IJ SOLN
INTRAMUSCULAR | Status: DC | PRN
  Administered 2020-07-14: 2 mg via INTRAVENOUS
  Administered 2020-07-14: 1 mg via INTRAVENOUS

## 2020-07-14 MED ORDER — LIDOCAINE HCL (PF) 1 % IJ SOLN
INTRAMUSCULAR | Status: DC | PRN
  Administered 2020-07-14: 2 mL via INTRADERMAL

## 2020-07-14 MED ORDER — ONDANSETRON HCL 4 MG/2ML IJ SOLN: 4.0000 mg | Freq: Four times a day (QID) | INTRAMUSCULAR | Status: DC | PRN

## 2020-07-14 MED ORDER — LIDOCAINE HCL (PF) 1 % IJ SOLN
INTRAMUSCULAR | Status: AC
  Filled 2020-07-14: qty 30

## 2020-07-14 MED ORDER — HEPARIN SODIUM (PORCINE) 1000 UNIT/ML IJ SOLN
INTRAMUSCULAR | Status: AC
  Filled 2020-07-14: qty 1

## 2020-07-14 MED ORDER — SODIUM CHLORIDE 0.9 % IV SOLN
250.0000 mL | INTRAVENOUS | Status: DC | PRN
Start: 2020-07-14 — End: 2020-07-14

## 2020-07-14 MED ORDER — SODIUM CHLORIDE 0.9 % IV SOLN: INTRAVENOUS | Status: DC

## 2020-07-14 MED ORDER — IOHEXOL 350 MG/ML SOLN
INTRAVENOUS | Status: DC | PRN
  Administered 2020-07-14: 25 mL via INTRA_ARTERIAL

## 2020-07-14 MED ORDER — HYDRALAZINE HCL 20 MG/ML IJ SOLN: 10.0000 mg | INTRAMUSCULAR | Status: DC | PRN

## 2020-07-14 MED ORDER — SODIUM CHLORIDE 0.9% FLUSH: 3.0000 mL | INTRAVENOUS | Status: DC | PRN

## 2020-07-14 MED ORDER — LABETALOL HCL 5 MG/ML IV SOLN: 10.0000 mg | INTRAVENOUS | Status: DC | PRN

## 2020-07-14 MED ORDER — FENTANYL CITRATE (PF) 100 MCG/2ML IJ SOLN
INTRAMUSCULAR | Status: AC
  Filled 2020-07-14: qty 2

## 2020-07-14 SURGICAL SUPPLY — 11 items
CATH INFINITI JR4 5F (CATHETERS) ×2 IMPLANT
CATH OPTITORQUE TIG 4.0 5F (CATHETERS) ×2 IMPLANT
DEVICE RAD COMP TR BAND LRG (VASCULAR PRODUCTS) ×2 IMPLANT
GLIDESHEATH SLEND SS 6F .021 (SHEATH) ×2 IMPLANT
GUIDEWIRE INQWIRE 1.5J.035X260 (WIRE) ×1 IMPLANT
INQWIRE 1.5J .035X260CM (WIRE) ×2
KIT HEART LEFT (KITS) ×2 IMPLANT
PACK CARDIAC CATHETERIZATION (CUSTOM PROCEDURE TRAY) ×2 IMPLANT
SHEATH PROBE COVER 6X72 (BAG) ×2 IMPLANT
TRANSDUCER W/STOPCOCK (MISCELLANEOUS) ×2 IMPLANT
TUBING CIL FLEX 10 FLL-RA (TUBING) ×2 IMPLANT

## 2020-07-14 NOTE — Discharge Instructions (Signed)
Radial Site Care  This sheet gives you information about how to care for yourself after your procedure. Your health care provider may also give you more specific instructions. If you have problems or questions, contact your health care provider. What can I expect after the procedure? After the procedure, it is common to have:  Bruising and tenderness at the catheter insertion area. Follow these instructions at home: Medicines  Take over-the-counter and prescription medicines only as told by your health care provider. Insertion site care 1. Follow instructions from your health care provider about how to take care of your insertion site. Make sure you: ? Wash your hands with soap and water before you remove your bandage (dressing). If soap and water are not available, use hand sanitizer. ? May remove dressing in 24 hours. 2. Check your insertion site every day for signs of infection. Check for: ? Redness, swelling, or pain. ? Fluid or blood. ? Pus or a bad smell. ? Warmth. 3. Do no take baths, swim, or use a hot tub for 5 days. 4. You may shower 24-48 hours after the procedure. ? Remove the dressing and gently wash the site with plain soap and water. ? Pat the area dry with a clean towel. ? Do not rub the site. That could cause bleeding. 5. Do not apply powder or lotion to the site. Activity  1. For 24 hours after the procedure, or as directed by your health care provider: ? Do not flex or bend the affected arm. ? Do not push or pull heavy objects with the affected arm. ? Do not drive yourself home from the hospital or clinic. You may drive 24 hours after the procedure. ? Do not operate machinery or power tools. ? KEEP ARM ELEVATED THE REMAINDER OF THE DAY. 2. Do not push, pull or lift anything that is heavier than 10 lb for 5 days. 3. Ask your health care provider when it is okay to: ? Return to work or school. ? Resume usual physical activities or sports. ? Resume sexual  activity. General instructions  If the catheter site starts to bleed, raise your arm and put firm pressure on the site. If the bleeding does not stop, get help right away. This is a medical emergency.  DRINK PLENTY OF FLUIDS FOR THE NEXT 2-3 DAYS.  No alcohol consumption for 24 hours after receiving sedation.  If you went home on the same day as your procedure, a responsible adult should be with you for the first 24 hours after you arrive home.  Keep all follow-up visits as told by your health care provider. This is important. Contact a health care provider if:  You have a fever.  You have redness, swelling, or yellow drainage around your insertion site. Get help right away if:  You have unusual pain at the radial site.  The catheter insertion area swells very fast.  The insertion area is bleeding, and the bleeding does not stop when you hold steady pressure on the area.  Your arm or hand becomes pale, cool, tingly, or numb. These symptoms may represent a serious problem that is an emergency. Do not wait to see if the symptoms will go away. Get medical help right away. Call your local emergency services (911 in the [place] Do not drive yourself to the hospital. Summary  After the procedure, it is common to have bruising and tenderness at the site.  Follow instructions from your health care provider about how to take care   of your radial site wound. Check the wound every day for signs of infection.  This information is not intended to replace advice given to you by your health care provider. Make sure you discuss any questions you have with your health care provider. Document Revised: 05/22/2017 Document Reviewed: 05/22/2017 Elsevier Patient Education  2020 Elsevier Inc. 

## 2020-07-14 NOTE — Interval H&P Note (Signed)
Cath Lab Visit (complete for each Cath Lab visit)  Clinical Evaluation Leading to the Procedure:   ACS: No.  Non-ACS:    Anginal Classification: CCS II  Anti-ischemic medical therapy: Minimal Therapy (1 class of medications)  Non-Invasive Test Results: Intermediate-risk stress test findings: cardiac mortality 1-3%/year  Prior CABG: No previous CABG      History and Physical Interval Note:  07/14/2020 7:47 AM  Yolanda Mosley  has presented today for surgery, with the diagnosis of CAD, abnormal CT.  The various methods of treatment have been discussed with the patient and family. After consideration of risks, benefits and other options for treatment, the patient has consented to  Procedure(s): LEFT HEART CATH AND CORONARY ANGIOGRAPHY (N/A) as a surgical intervention.  The patient's history has been reviewed, patient examined, no change in status, stable for surgery.  I have reviewed the patient's chart and labs.  Questions were answered to the patient's satisfaction.     Shelva Majestic

## 2020-07-15 ENCOUNTER — Telehealth: Payer: Self-pay | Admitting: Cardiovascular Disease

## 2020-07-15 MED FILL — Verapamil HCl IV Soln 2.5 MG/ML: INTRAVENOUS | Qty: 2 | Status: AC

## 2020-07-15 NOTE — Telephone Encounter (Signed)
Spoke with pt, aware there were no medication change noted on cath report. She will need to stay on the same medications until she sees dr Bettina Gavia back in follow up.

## 2020-07-15 NOTE — Addendum Note (Signed)
Addended byShirlee More on: 07/15/2020 07:44 AM   Modules accepted: Orders

## 2020-07-15 NOTE — Telephone Encounter (Signed)
Patient was calling due to have a cath done yesterday and wants to know if she should continue to take the medication that she has. Please call back

## 2020-07-22 ENCOUNTER — Other Ambulatory Visit: Payer: Self-pay | Admitting: Family Medicine

## 2020-07-26 ENCOUNTER — Other Ambulatory Visit: Payer: Self-pay

## 2020-07-26 ENCOUNTER — Telehealth: Payer: Self-pay

## 2020-07-26 DIAGNOSIS — F902 Attention-deficit hyperactivity disorder, combined type: Secondary | ICD-10-CM

## 2020-07-26 MED ORDER — LISDEXAMFETAMINE DIMESYLATE 70 MG PO CAPS
70.0000 mg | ORAL_CAPSULE | Freq: Every day | ORAL | 0 refills | Status: DC
Start: 2020-07-26 — End: 2020-08-25

## 2020-07-26 NOTE — Telephone Encounter (Signed)
Pt called and requested refill on Vyvanse. Last seen by Dr. Madilyn Fireman on 05/31/20. Medication refill pended.

## 2020-07-29 DIAGNOSIS — I10 Essential (primary) hypertension: Secondary | ICD-10-CM | POA: Insufficient documentation

## 2020-08-09 ENCOUNTER — Ambulatory Visit: Payer: BC Managed Care – PPO | Admitting: Internal Medicine

## 2020-08-09 ENCOUNTER — Encounter: Payer: Self-pay | Admitting: Internal Medicine

## 2020-08-09 ENCOUNTER — Other Ambulatory Visit: Payer: Self-pay

## 2020-08-09 VITALS — BP 124/84 | HR 81 | Ht 64.0 in | Wt 231.2 lb

## 2020-08-09 DIAGNOSIS — I10 Essential (primary) hypertension: Secondary | ICD-10-CM | POA: Diagnosis not present

## 2020-08-09 DIAGNOSIS — E038 Other specified hypothyroidism: Secondary | ICD-10-CM | POA: Diagnosis not present

## 2020-08-09 NOTE — Progress Notes (Signed)
Name: Yolanda Mosley  MRN/ DOB: [id number], December 28, 1980    Age/ Sex: 40 y.o., female    PCP: Hali Marry, MD   Reason for Endocrinology Evaluation: Elevated metanephrines      Date of Initial Endocrinology Evaluation: 08/09/2020     HPI: Ms. Yolanda Mosley is a 40 y.o. female with a past medical history of HTN.  The patient presented for initial endocrinology clinic visit on 08/09/2020 for consultative assistance with her elevated metanephrines .    Ms. Yolanda Mosley is accompanied by a friend today  The patient was referred her for elevated urine metanephrines  Of note the pt follows with cardiology for Chest pain, HTN and LVH, she has had extensive work up to include stress test, echo and cardiac cath.     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    Has been  on the Vyvanse  for ~ 3 yrs  No hx of hypokalemia  Has been having teeth issues around started 04/2020 Denies weakness  Denies abdominal striae Bruises easily  Denies diabetes  She is stressed due to work  Has occasional palpitations    Serum metanephrines were done while OFF all meds   HISTORY:  Past Medical History:  Past Medical History:  Diagnosis Date  . ADHD 03/01/2011  . ALLERGIC RHINITIS 01/09/2010   Qualifier: Diagnosis of  By: Madilyn Fireman MD, Barnetta Chapel    . Allergy   . Anemia   . Anemia, iron deficiency 09/09/2013  . Asthma   . Bilateral low back pain without sciatica 08/10/2014  . 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 DDD (degenerative disc disease), lumbar 08/11/2014   L5 and S1 via xrays 07/2014.    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 EXTERNAL HEMORRHOIDS 01/09/2010   Qualifier: Diagnosis of  By: Madilyn Fireman MD, Barnetta Chapel    . Gastric ulcer 09/14/2013   Seen on EGD 08/2013 with [facility]   .  Gastroesophageal reflux disease with esophagitis 08/16/2016  . GERD (gastroesophageal reflux disease)   . Hemorrhoids    laser tx   . Herpes simplex virus (HSV) infection   . 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 History of tachycardia 10/22/2016  . 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 Hypertension   . HYPERTENSION, MILD 01/09/2010   Qualifier: Diagnosis of  By: Madilyn Fireman MD, Barnetta Chapel    . Mid back pain 08/10/2014  . Mild mitral regurgitation by prior echocardiogram 11/14/2016  . Mild persistent asthma without complication 10/29/7791  . Muscle spasm of back 08/10/2014  . Obesity   . OSA on CPAP 01/08/2011   Sleep study 01/24/2006.  CPAP 7 cm water pressure.  Mild OSA AHI 8.7.   . Ovarian cyst    left  . Peripheral edema 10/22/2016  . Post chemo evaluation 04/04/2015  . Sleep apnea   . Tachycardia   . Venous stasis dermatitis of both lower extremities 10/23/2016   Past Surgical History:  Past Surgical History:  Procedure Laterality Date  . LEFT HEART CATH AND CORONARY ANGIOGRAPHY N/A 07/14/2020   Procedure: LEFT HEART CATH AND CORONARY ANGIOGRAPHY;  Surgeon: Troy Sine, MD;  Location: [facility];  Service: Cardiovascular;  Laterality: N/A;  .  OMENTECTOMY  2005  . ovary, bilater fallopian tubes removed  2005  . SALPINGOOPHORECTOMY Right       Social History:  reports that she has never smoked. She has never used smokeless tobacco. She reports that she does not drink alcohol and does not use drugs.  Family History: family history includes Diabetes in an other family member; Hypertension in her mother; Other in an other family member.   HOME MEDICATIONS: Allergies as of 08/09/2020      Reactions   Ibuprofen    Severe bleeding   Other Rash   Hydrocodone Other (See Comments)   Dizzy , nauseous, swimmy headed.(Can take with phenergan)   Keflex [cephalexin]    Diarrhea and more abdominal cramping Diarrhea and more abdominal  cramping   Codeine Nausea Only   Tape Rash   Plastic tape, tegaderm       Medication List       Accurate as of August 09, 2020  9:38 AM. If you have any questions, ask your nurse or doctor.        albuterol 108 (90 Base) MCG/ACT inhaler Commonly known as: VENTOLIN HFA Inhale 2 puffs into the lungs every 6 (six) hours as needed for wheezing or shortness of breath.   atenolol 50 MG tablet Commonly known as: TENORMIN Take 1 tablet (50 mg total) by mouth daily. What changed:   how much to take  additional instructions   benazepril-hydrochlorthiazide 20-12.5 MG tablet Commonly known as: LOTENSIN HCT Take 1 tablet by mouth daily.   cetirizine 10 MG tablet Commonly known as: ZYRTEC Take 10 mg by mouth daily.   diltiazem 120 MG 24 hr capsule Commonly known as: CARDIZEM CD Take 1 capsule (120 mg total) by mouth daily.   doxazosin 2 MG tablet Commonly known as: CARDURA Take 1 tablet by mouth once daily   lisdexamfetamine 70 MG capsule Commonly known as: Vyvanse Take 1 capsule (70 mg total) by mouth daily before breakfast.   valACYclovir 1000 MG tablet Commonly known as: VALTREX Take 1 tablet (1,000 mg total) by mouth 2 (two) times daily. What changed:   when to take this  reasons to take this         REVIEW OF SYSTEMS: A comprehensive ROS was conducted with the patient and is negative except as per HPI     OBJECTIVE:  VS: BP 124/84   Pulse 81   Ht '5\' 4"'  (1.626 m)   Wt 231 lb 4 oz (104.9 kg)   LMP 07/14/2020   SpO2 98%   BMI 39.69 kg/m    Wt Readings from Last 3 Encounters:  08/09/20 231 lb 4 oz (104.9 kg)  07/14/20 233 lb (105.7 kg)  06/27/20 234 lb (106.1 kg)     EXAM: General: Pt appears well and is in NAD  Neck: General: Supple without adenopathy. Thyroid: Thyroid size normal.  No goiter or nodules appreciated.   Lungs: Clear with good BS bilat with no rales, rhonchi, or wheezes  Heart: Auscultation: RRR.  Abdomen: Normoactive bowel  sounds, soft, nontender, without masses or organomegaly palpable  Extremities:  BL LE: No pretibial edema normal ROM and strength.  Neuro: Cranial nerves: II - XII grossly intact  Motor: Normal strength throughout DTRs: 2+ and symmetric in UE without delay in relaxation phase  Mental Status: Judgment, insight: Intact Orientation: Oriented to time, place, and person Mood and affect: No depression, anxiety, or agitation     DATA REVIEWED: Results for CECELY, RENGEL (MRN [id number])  as of 08/10/2020 16:19  Ref. Range 07/08/2020 15:39  Sodium Latest Ref Range: 134 - 144 mmol/L 140  Potassium Latest Ref Range: 3.5 - 5.2 mmol/L 4.1  Chloride Latest Ref Range: 96 - 106 mmol/L 101  CO2 Latest Ref Range: 20 - 29 mmol/L 23  Glucose Latest Ref Range: 65 - 99 mg/dL 95  BUN Latest Ref Range: 6 - 20 mg/dL 15  Creatinine Latest Ref Range: 0.57 - 1.00 mg/dL 0.65  Calcium Latest Ref Range: 8.7 - 10.2 mg/dL 9.7  BUN/Creatinine Ratio Latest Ref Range: 9 - 23  23  EGFR Latest Ref Range: >59 mL/min/1.73 115  WBC Latest Ref Range: 3.4 - 10.8 x10E3/uL 5.8  RBC Latest Ref Range: 3.77 - 5.28 x10E6/uL 4.76  Hemoglobin Latest Ref Range: 11.1 - 15.9 g/dL 13.9  HCT Latest Ref Range: 34.0 - 46.6 % 41.3  MCV Latest Ref Range: 79 - 97 fL 87  MCH Latest Ref Range: 26.6 - 33.0 pg 29.2  MCHC Latest Ref Range: 31.5 - 35.7 g/dL 33.7  RDW Latest Ref Range: 11.7 - 15.4 % 13.1  Platelets Latest Ref Range: 150 - 450 x10E3/uL 216   Results for Hagstrom, Lory (MRN [id number]) as of 08/10/2020 16:19  Ref. Range 06/13/2020 07:50  TSH Latest Units: mIU/L 5.01 (H)   Results for Leanor, Voris Ellie (MRN [id number]) as of 08/10/2020 16:19  Ref. Range 06/13/2020 07:59  ALDOSTERONE Latest Ref Range:  ng/dL 6  ALDO / PRA Ratio Latest Ref Range: 0.9 - 28.9 Ratio 1.8  Epinephrine Latest Units: pg/mL <20  Norepinephrine Latest Units: pg/mL 456  Dopamine Latest Units: pg/mL <10  Metanephrine, Pl Latest Ref Range: <=57 pg/mL <25  Normetanephrine,  Pl Latest Ref Range: <=148 pg/mL 93  Renin Activity Latest Ref Range: 0.25 - 5.82 ng/mL/h 3.36   06/15/2020  Urine normetanephrine 788  Metanephrine total 898 ( 115- 695)  Metanephrine 110 (36-190)  Normetanephrine 788  (35-482)   06/13/2020 Epinephrine < 20 Norepi 456  Dopamine 10  Total 546 pg/mL   Renin 3.36 (0.25-5.82 ) Aldo 6 ng/dL  Aldo: renin ratio 1.8   TSH 5.01 mIU/L    ASSESSMENT/PLAN/RECOMMENDATIONS:   1. Hypertension:  -The patient had normal renin, aldosterone, catecholamines, she did have a slight elevation of urinary normetanephrine's at 788 mcg/24 h, which is less than double the upper limit of normal  -I have very low suspicion for pheochromocytoma with these numbers, I explained to the patient that pheochromocytoma typically excrete between 3-5 times the upper limit of normal -We have agreed to proceed with repeat 24-hour metanephrine as well as cortisol. -Of note the patient had normal plasma metanephrines while of all medications including Vyvanse, the patient did not feel well while of Vyvanse and would like to continue on this for now. - We  did discuss that being on Vyvanse since its a byproduct of amphetamine could cause an increase in her metanephrines / catecholamines, and we have opted to continue Vyvanse as long as she understands that a slight increase in her catecholamines/metanephrines could be explained by being on Vyvanse.   2.  Subclinical hypothyroid:   -This is slightly off and does not require any treatment at this time but we will continue to monitor   Follow-up in 3 months  Signed electronically by: Mack Guise, MD  [facility]  [facility] [address] [place], [place] [address] Phone: [phone] FAX: [phone]   CC: Hali Marry, [address]  [place] [address] Phone: [phone] Fax: [phone]   Return to Endocrinology clinic as  below: Future Appointments  Date Time Provider [place]  08/11/2020  8:00 AM Bettina Gavia, Hilton Cork, MD [facility] None

## 2020-08-09 NOTE — Patient Instructions (Signed)

## 2020-08-10 DIAGNOSIS — I10 Essential (primary) hypertension: Secondary | ICD-10-CM | POA: Insufficient documentation

## 2020-08-10 DIAGNOSIS — E038 Other specified hypothyroidism: Secondary | ICD-10-CM | POA: Insufficient documentation

## 2020-08-10 NOTE — Progress Notes (Signed)
Cardiology Office Note:    Date:  08/11/2020   ID:  Yolanda Mosley, DOB 1980-05-21, MRN [id number]  PCP:  Hali Marry, MD  Cardiologist:  Shirlee More, MD    Referring MD: Hali Marry, *    ASSESSMENT:    1. Chest pain of uncertain etiology   2. Essential hypertension    PLAN:    In order of problems listed above:  1. She is improved reassured I do not see the need for further cardiac diagnostic testing I will see back in the office as needed 2. 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  Continue current antihypertensive agent   Next appointment: As needed   Medication Adjustments/Labs and Tests Ordered: Current medicines are reviewed at length with the patient today.  Concerns regarding medicines are outlined above.  No orders of the defined types were placed in this encounter.  No orders of the defined types were placed in this encounter.   Chief Complaint  Patient presents with  . Follow-up  After coronary angiography  History of Present Illness:    Yolanda Mosley is a 40 y.o. female with a hx of chest pain abnormal cardiac CTA last seen through 06/27/2020 and referred to coronary.  She was seen by endocrinology for hypertension with elevated metanephrines.  Compliance with diet, lifestyle and medications: Yes  She is better no further chest She inquires about stopping antihypertensive agents her BP is at target I told her in her case I would not do that.  I strongly encouraged her to find ways to manage work stress lose 10% of her body mass screen her blood pressure at home and gave her tips how to do it and sodium restrict.  She voiced understanding that she has no  structurally normal heart  Her cardiac CTA showed a calcium score of 0 there was an area in the mid left anterior descending coronary artery of apparent stenosis probably represent poor filling of the vessel.  A coronary angiogram showed normal arteries no atherosclerosis or CAD. Her echocardiogram performed 06/15/2020 showed normal left ventricular size function systolic and diastolic function and GLS.  There is no significant valvular abnormality.  The study was normal for age. Past Medical History:  Diagnosis Date  . ADHD 03/01/2011  . ALLERGIC RHINITIS 01/09/2010   Qualifier: Diagnosis of  By: Madilyn Fireman MD, Barnetta Chapel    . Allergy   . Anemia   . Anemia, iron deficiency 09/09/2013  . Asthma   . Bilateral low back pain without sciatica 08/10/2014  . 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 DDD (degenerative disc disease), lumbar 08/11/2014   L5 and S1 via xrays 07/2014.    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 EXTERNAL HEMORRHOIDS 01/09/2010   Qualifier: Diagnosis of  By: Madilyn Fireman MD, Barnetta Chapel    . Gastric ulcer 09/14/2013   Seen on EGD 08/2013 with [facility]   . Gastroesophageal reflux disease with esophagitis 08/16/2016  . GERD (gastroesophageal reflux disease)   . Hemorrhoids    laser tx   . Herpes simplex virus (HSV) infection   . 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  History of tachycardia 10/22/2016  . 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 Hypertension   . HYPERTENSION, MILD 01/09/2010   Qualifier: Diagnosis of  By: Madilyn Fireman MD, Barnetta Chapel    . Mid back pain 08/10/2014  . Mild mitral regurgitation by prior echocardiogram 11/14/2016  . Mild persistent asthma without complication 7/78/2423  . Muscle spasm of back 08/10/2014  . Obesity   . OSA on CPAP 01/08/2011   Sleep study 01/24/2006.  CPAP 7 cm water pressure.  Mild OSA AHI 8.7.   . Ovarian cyst    left  . Peripheral edema 10/22/2016  . Post chemo  evaluation 04/04/2015  . Sleep apnea   . Tachycardia   . Venous stasis dermatitis of both lower extremities 10/23/2016    Past Surgical History:  Procedure Laterality Date  . LEFT HEART CATH AND CORONARY ANGIOGRAPHY N/A 07/14/2020   Procedure: LEFT HEART CATH AND CORONARY ANGIOGRAPHY;  Surgeon: Troy Sine, MD;  Location: [place] [facility];  Service: Cardiovascular;  Laterality: N/A;  . OMENTECTOMY  2005  . ovary, bilater fallopian tubes removed  2005  . SALPINGOOPHORECTOMY Right     Current Medications: Current Meds  Medication Sig  . albuterol (VENTOLIN HFA) 108 (90 Base) MCG/ACT inhaler Inhale 2 puffs into the lungs every 6 (six) hours as needed for wheezing or shortness of breath.  Marland Kitchen atenolol (TENORMIN) 50 MG tablet Take 25 mg by mouth daily.  . benazepril-hydrochlorthiazide (LOTENSIN HCT) 20-12.5 MG tablet Take 1 tablet by mouth daily.  . cetirizine (ZYRTEC) 10 MG tablet Take 10 mg by mouth daily.  Marland Kitchen diltiazem (CARDIZEM CD) 120 MG 24 hr capsule Take 1 capsule (120 mg total) by mouth daily.  Marland Kitchen doxazosin (CARDURA) 2 MG tablet Take 1 tablet by mouth once daily  . lisdexamfetamine (VYVANSE) 70 MG capsule Take 1 capsule (70 mg total) by mouth daily before breakfast.  . valACYclovir (VALTREX) 1000 MG tablet Take 1,000 mg by mouth 2 (two) times daily as needed (Outbreak).  . [DISCONTINUED] valACYclovir (VALTREX) 1000 MG tablet Take 1 tablet (1,000 mg total) by mouth 2 (two) times daily. (Patient taking differently: Take 1,000 mg by mouth 2 (two) times daily as needed (outbreaks).)     Allergies:   Ibuprofen, Other, Hydrocodone, Keflex [cephalexin], Codeine, and Tape   Social History   Socioeconomic History  . Marital status: Single    Spouse name: Not on file  . Number of children: Not on file  . Years of education: Not on file  . Highest education level: Not on file  Occupational History  . Not on file  Tobacco Use  . Smoking status: Never Smoker  . Smokeless tobacco:  Never Used  Substance and Sexual Activity  . Alcohol use: No  . Drug use: No  . Sexual activity: Yes    Birth control/protection: None  Other Topics Concern  . Not on file  Social History Narrative  . Not on file   Social Determinants of Health   Financial Resource Strain: Not on file  Food Insecurity: Not on file  Transportation Needs: Not on file  Physical Activity: Not on file  Stress: Not on file  Social Connections: Not on file     Family History: The patient's family history includes Diabetes in an other family member; Hypertension in her mother; Other in an other family member. ROS:   Please see the history of present illness.    All other systems reviewed and are negative.  EKGs/Labs/Other Studies Reviewed:  The following studies were reviewed today:   Recent Labs: 03/28/2020: ALT 22 06/13/2020: TSH 5.01 07/08/2020: BUN 15; Creatinine, Ser 0.65; Hemoglobin 13.9; Platelets 216; Potassium 4.1; Sodium 140  Recent Lipid Panel    Component Value Date/Time   CHOL 156 03/28/2020 0000   TRIG 100 03/28/2020 0000   HDL 50 03/28/2020 0000   CHOLHDL 3.1 03/28/2020 0000   VLDL 19 11/04/2015 1021   LDLCALC 86 03/28/2020 0000    Physical Exam:    VS:  BP 120/80 (BP Location: Left Arm, Patient Position: Sitting, Cuff Size: Large)   Pulse 74   Ht 5\' 4"  (1.626 m)   Wt 232 lb (105.2 kg)   LMP 07/14/2020   SpO2 96%   BMI 39.82 kg/m     Wt Readings from Last 3 Encounters:  08/11/20 232 lb (105.2 kg)  08/09/20 231 lb 4 oz (104.9 kg)  07/14/20 233 lb (105.7 kg)     GEN: Overweight well nourished, well developed in no acute distress HEENT: Normal NECK: No JVD; No carotid bruits LYMPHATICS: No lymphadenopathy CARDIAC: RRR, no murmurs, rubs, gallops RESPIRATORY:  Clear to auscultation without rales, wheezing or rhonchi  ABDOMEN: Soft, non-tender, non-distended MUSCULOSKELETAL:  No edema; No deformity  SKIN: Warm and dry NEUROLOGIC:  Alert and oriented x  3 PSYCHIATRIC:  Normal affect    Signed, Shirlee More, MD  08/11/2020 9:03 AM    [place]

## 2020-08-11 ENCOUNTER — Other Ambulatory Visit: Payer: Self-pay

## 2020-08-11 ENCOUNTER — Encounter: Payer: Self-pay | Admitting: Cardiology

## 2020-08-11 ENCOUNTER — Ambulatory Visit: Payer: BC Managed Care – PPO | Admitting: Cardiology

## 2020-08-11 ENCOUNTER — Other Ambulatory Visit: Payer: BC Managed Care – PPO

## 2020-08-11 VITALS — BP 120/80 | HR 74 | Ht 64.0 in | Wt 232.0 lb

## 2020-08-11 DIAGNOSIS — I10 Essential (primary) hypertension: Secondary | ICD-10-CM

## 2020-08-11 DIAGNOSIS — R079 Chest pain, unspecified: Secondary | ICD-10-CM | POA: Diagnosis not present

## 2020-08-11 NOTE — Patient Instructions (Signed)

## 2020-08-15 LAB — METANEPHRINES, URINE, 24 HOUR
METANEPHRINE: 130 mcg/24 h (ref 36–190)
METANEPHRINES, TOTAL: 706 mcg/24 h — ABNORMAL HIGH (ref 115–695)
NORMETANEPHRINE: 576 mcg/24 h — ABNORMAL HIGH (ref 35–482)
Total Volume: 1000 mL

## 2020-08-16 LAB — CORTISOL, URINE, 24 HOUR
24 Hour urine volume (VMAHVA): 1000 mL
CREATININE, URINE: 1.54 g/(24.h) (ref 0.50–2.15)
Cortisol (Ur), Free: 9.5 mcg/24 h (ref 4.0–50.0)

## 2020-08-25 ENCOUNTER — Other Ambulatory Visit: Payer: Self-pay | Admitting: Family Medicine

## 2020-08-25 DIAGNOSIS — F902 Attention-deficit hyperactivity disorder, combined type: Secondary | ICD-10-CM

## 2020-08-25 MED ORDER — LISDEXAMFETAMINE DIMESYLATE 70 MG PO CAPS: 70.0000 mg | ORAL_CAPSULE | Freq: Every day | ORAL | 0 refills | Status: DC

## 2020-09-20 ENCOUNTER — Telehealth: Payer: Self-pay | Admitting: *Deleted

## 2020-09-20 DIAGNOSIS — F902 Attention-deficit hyperactivity disorder, combined type: Secondary | ICD-10-CM

## 2020-09-20 NOTE — Telephone Encounter (Signed)
Called and advised pt that she will need to schedule a f/u. She has 4 tabs left.   She will call back to schedule an appt for this.

## 2020-09-21 MED ORDER — LISDEXAMFETAMINE DIMESYLATE 70 MG PO CAPS: 70.0000 mg | ORAL_CAPSULE | Freq: Every day | ORAL | 0 refills | Status: DC

## 2020-09-21 NOTE — Telephone Encounter (Signed)
Medication sent at below:   Meds ordered this encounter  Medications  . lisdexamfetamine (VYVANSE) 70 MG capsule    Sig: Take 1 capsule (70 mg total) by mouth daily.    Dispense:  30 capsule    Refill:  0   @T @   Beatrice Lecher, MD

## 2020-10-03 ENCOUNTER — Other Ambulatory Visit: Payer: Self-pay

## 2020-10-03 ENCOUNTER — Encounter: Payer: Self-pay | Admitting: Family Medicine

## 2020-10-03 ENCOUNTER — Ambulatory Visit (INDEPENDENT_AMBULATORY_CARE_PROVIDER_SITE_OTHER): Payer: BC Managed Care – PPO | Admitting: Family Medicine

## 2020-10-03 VITALS — BP 117/64 | HR 75 | Ht 64.0 in | Wt 231.0 lb

## 2020-10-03 DIAGNOSIS — R600 Localized edema: Secondary | ICD-10-CM

## 2020-10-03 DIAGNOSIS — F902 Attention-deficit hyperactivity disorder, combined type: Secondary | ICD-10-CM

## 2020-10-03 DIAGNOSIS — I10 Essential (primary) hypertension: Secondary | ICD-10-CM

## 2020-10-03 MED ORDER — TRIAMCINOLONE ACETONIDE 0.1 % EX CREA: 1.0000 "application " | TOPICAL_CREAM | Freq: Two times a day (BID) | CUTANEOUS | 0 refills | Status: DC

## 2020-10-03 MED ORDER — LISDEXAMFETAMINE DIMESYLATE 70 MG PO CAPS: 70.0000 mg | ORAL_CAPSULE | Freq: Every day | ORAL | 0 refills | Status: DC

## 2020-10-03 NOTE — Progress Notes (Signed)
Pt reports that she has been getting some Bilateral leg swelling worse on R Lower leg and ankle that she has noticed since March. She also has a rash on R leg around ankle that comes and goes but hasn't gone away this week. She stated that the area gets itchty and she has tried hydrocortisone cream but this hasn't worked well to help stop the itching.   She denies any SOB/dizziness.   She has an appt for annual at [facility]

## 2020-10-03 NOTE — Assessment & Plan Note (Signed)
Well-controlled on current regimen.  Next 90-day prescriptions is sent.  Did encourage her to follow-up at the end of September for her ADHD medications.

## 2020-10-03 NOTE — Patient Instructions (Signed)
Increase lisinopril HCT to 2 tabs daily.  These can be taken at the same time or separated whichever you prefer. These hold the doxazosin while taking the increased dose of the lisinopril HCT. Hopefully the swelling will improve.

## 2020-10-03 NOTE — Progress Notes (Signed)
Established Patient Office Visit  Subjective:  Patient ID: Yolanda Mosley, female    DOB: 1980/08/03  Age: 40 y.o. MRN: [id number]  CC:  Chief Complaint  Patient presents with  . ADHD    HPI [name] presents for   ADHD - Reports symptoms are well controlled on current regime. Denies any problems with insomnia, chest pain, palpitations, or SOB.    Pt reports that she has been getting some Bilateral leg swelling worse on R Lower leg and ankle that she has noticed since March. She also has a rash on R leg around ankle that comes and goes but hasn't gone away this week. She stated that the area gets itchty and she has tried hydrocortisone cream but this hasn't worked well to help stop the itching.   She denies any SOB/dizziness.   Please see prior cardiology notes.  Things have leveled off and she is actually doing well in fact they have completely released her she was having problems with significantly elevated and uncontrolled blood pressure as well as palpitations after having had COVID in February.  Past Medical History:  Diagnosis Date  . ADHD 03/01/2011  . ALLERGIC RHINITIS 01/09/2010   Qualifier: Diagnosis of  By: Madilyn Fireman MD, Barnetta Chapel    . Allergy   . Anemia   . Anemia, iron deficiency 09/09/2013  . Asthma   . Bilateral low back pain without sciatica 08/10/2014  . 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 DDD (degenerative disc disease), lumbar 08/11/2014   L5 and S1 via xrays 07/2014.    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 EXTERNAL HEMORRHOIDS 01/09/2010   Qualifier: Diagnosis of  By: Madilyn Fireman MD, Barnetta Chapel    . Gastric ulcer 09/14/2013   Se[facility] on EGD 08/2013 with [facility]   . Gastroesophageal reflux disease with esophagitis 08/16/2016  . GERD (gastroesophageal reflux disease)   . Hemorrhoids    laser tx   . Herpes simplex virus (HSV) infection   . 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  History of tachycardia 10/22/2016  . 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 Hypertension   . HYPERTENSION, MILD 01/09/2010   Qualifier: Diagnosis of  By: Madilyn Fireman MD, Barnetta Chapel    . Mid back pain 08/10/2014  . Mild mitral regurgitation by prior echocardiogram 11/14/2016  . Mild persistent asthma without complication 1/91/4782  . Muscle spasm of back 08/10/2014  . Obesity   . OSA on CPAP 01/08/2011   Sleep study 01/24/2006.  CPAP 7 cm water pressure.  Mild OSA AHI 8.7.   . Ovarian cyst    left  . Peripheral edema 10/22/2016  . Post chemo evaluation 04/04/2015  . Sleep apnea   . Tachycardia   . Venous stasis dermatitis of both lower extremities 10/23/2016    Past Surgical History:  Procedure Laterality Date  . LEFT HEART CATH AND CORONARY ANGIOGRAPHY N/A 07/14/2020   Procedure: LEFT HEART CATH AND CORONARY ANGIOGRAPHY;  Surgeon: Troy Sine, MD;  Location: [facility];  Service: Cardiovascular;  Laterality: N/A;  . OMENTECTOMY  2005  . ovary, bilater fallopian tubes removed  2005  . SALPINGOOPHORECTOMY Right     Family History  Problem Relation Age of Onset  . Hypertension Mother   . Diabetes Other   . Other Other        cardiovascular disorder    Social History   Socioeconomic History  .  Marital status: Single    Spouse name: Not on file  . Number of children: Not on file  . Years of education: Not on file  . Highest education level: Not on file  Occupational History  . Not on file  Tobacco Use  . Smoking status: Never Smoker  . Smokeless tobacco: Never Used  Substance and Sexual Activity  . Alcohol use: No  . Drug use: No  . Sexual activity: Yes    Birth control/protection: None  Other Topics Concern  . Not on file  Social History Narrative  . Not on file   Social Determinants of Health   Financial Resource Strain: Not on file  Food Insecurity: Not on file  Transportation Needs: Not on file  Physical Activity: Not on file  Stress: Not on file  Social  Connections: Not on file  Intimate Partner Violence: Not on file    Outpatient Medications Prior to Visit  Medication Sig Dispense Refill  . albuterol (VENTOLIN HFA) 108 (90 Base) MCG/ACT inhaler Inhale 2 puffs into the lungs every 6 (six) hours as needed for wheezing or shortness of breath.    Marland Kitchen atenolol (TENORMIN) 50 MG tablet Take 25 mg by mouth daily.    . benazepril-hydrochlorthiazide (LOTENSIN HCT) 20-12.5 MG tablet Take 1 tablet by mouth daily. 90 tablet 2  . cetirizine (ZYRTEC) 10 MG tablet Take 10 mg by mouth daily.    Marland Kitchen diltiazem (CARDIZEM CD) 120 MG 24 hr capsule Take 1 capsule (120 mg total) by mouth daily. 90 capsule 3  . doxazosin (CARDURA) 2 MG tablet Take 1 tablet by mouth once daily 30 tablet 1  . valACYclovir (VALTREX) 1000 MG tablet Take 1,000 mg by mouth 2 (two) times daily as needed (Outbreak).    Marland Kitchen lisdexamfetamine (VYVANSE) 70 MG capsule Take 1 capsule (70 mg total) by mouth daily before breakfast. 30 capsule 0  . lisdexamfetamine (VYVANSE) 70 MG capsule Take 1 capsule (70 mg total) by mouth daily. 30 capsule 0   No facility-administered medications prior to visit.    Allergies  Allergen Reactions  . Ibuprofen     Severe bleeding  . Other Rash  . Hydrocodone Other (See Comments)    Dizzy , nauseous, swimmy headed.(Can take with phenergan)  . Keflex [Cephalexin]     Diarrhea and more abdominal cramping Diarrhea and more abdominal cramping  . Codeine Nausea Only  . Tape Rash    Plastic tape, tegaderm     ROS Review of Systems    Objective:    Physical Exam Constitutional:      Appearance: She is well-developed.  HENT:     Head: Normocephalic and atraumatic.  Cardiovascular:     Rate and Rhythm: Normal rate and regular rhythm.     Heart sounds: Normal heart sounds.  Pulmonary:     Effort: Pulmonary effort is normal.     Breath sounds: Normal breath sounds.  Musculoskeletal:     Comments: Recent ankle edema bilaterally.  She does have an  erythematous circular rash around the right ankle.  No cracking of the skin no drainage.  No papular lesions.  Skin:    General: Skin is warm and dry.  Neurological:     Mental Status: She is alert and oriented to person, place, and time.  Psychiatric:        Behavior: Behavior normal.     BP 117/64   Pulse 75   Ht '5\' 4"'  (1.626 m)   Wt 231 lb (  104.8 kg)   SpO2 99%   BMI 39.65 kg/m  Wt Readings from Last 3 Encounters:  10/03/20 231 lb (104.8 kg)  08/11/20 232 lb (105.2 kg)  08/09/20 231 lb 4 oz (104.9 kg)     There are no preventive care reminders to display for this patient.  There are no preventive care reminders to display for this patient.  Lab Results  Component Value Date   TSH 5.01 (H) 06/13/2020   Lab Results  Component Value Date   WBC 5.8 07/08/2020   HGB 13.9 07/08/2020   HCT 41.3 07/08/2020   MCV 87 07/08/2020   PLT 216 07/08/2020   Lab Results  Component Value Date   NA 140 07/08/2020   K 4.1 07/08/2020   CO2 23 07/08/2020   GLUCOSE 95 07/08/2020   BUN 15 07/08/2020   CREATININE 0.65 07/08/2020   BILITOT 0.4 03/28/2020   ALKPHOS 109 11/04/2015   AST 17 03/28/2020   ALT 22 03/28/2020   PROT 7.5 03/28/2020   ALBUMIN 4.0 11/04/2015   CALCIUM 9.7 07/08/2020   EGFR 115 07/08/2020   Lab Results  Component Value Date   CHOL 156 03/28/2020   Lab Results  Component Value Date   HDL 50 03/28/2020   Lab Results  Component Value Date   LDLCALC 86 03/28/2020   Lab Results  Component Value Date   TRIG 100 03/28/2020   Lab Results  Component Value Date   CHOLHDL 3.1 03/28/2020   No results found for: HGBA1C    Assessment & Plan:   Problem List Items Addressed This Visit      Cardiovascular and Mediastinum   RESOLVED: HYPERTENSION, MILD - Primary (Chronic)   Relevant Orders   Basic Metabolic Panel (BMET)   Essential hypertension    Discussed options.  I am to have her increase her lisinopril HCTZ to 2 tabs so that we can increase  the diuretic component and see if that actually improves her lower extremity swelling.  In the meantime she is can hold the doxazosin and see if that helps.  If she feels like she is tolerating this regimen well and decides that she wants to stay on it we can always send over new prescription for the higher strength lisinopril HCTZ.  In addition she will need to go to the lab to check a BMP in about 2 weeks.        Other   ADHD    Well-controlled on current regimen.  Next 90-day prescriptions is sent.  Did encourage her to follow-up at the end of September for her ADHD medications.      Relevant Medications   lisdexamfetamine (VYVANSE) 70 MG capsule (Start on 11/19/2020)   lisdexamfetamine (VYVANSE) 70 MG capsule (Start on 10/21/2020)   lisdexamfetamine (VYVANSE) 70 MG capsule (Start on 12/19/2020)    Other Visit Diagnoses    Bilateral lower extremity edema         Rash -I think some of it is related to the swelling causing stretching and irritation of the skin.  We will try triamcinolone cream.  Call if not better in 1 week.  Okay to use up to 2 weeks before but then when he did give her skin a break  Lateral lower extremity edema-she says she eats a low-salt diet so just encouraged her to continue to work on that.  We will hold doxazosin so that we can increase the HCTZ component of her regimen and see if this helps.  Continue to stay active.  Also discussed the possibility of compression stockings.  Meds ordered this encounter  Medications  . lisdexamfetamine (VYVANSE) 70 MG capsule    Sig: Take 1 capsule (70 mg total) by mouth daily.    Dispense:  30 capsule    Refill:  0  . lisdexamfetamine (VYVANSE) 70 MG capsule    Sig: Take 1 capsule (70 mg total) by mouth daily before breakfast.    Dispense:  30 capsule    Refill:  0  . lisdexamfetamine (VYVANSE) 70 MG capsule    Sig: Take 1 capsule (70 mg total) by mouth daily.    Dispense:  30 capsule    Refill:  0  . triamcinolone cream  (KENALOG) 0.1 %    Sig: Apply 1 application topically 2 (two) times daily.    Dispense:  30 g    Refill:  0    Follow-up: Return in about 14 weeks (around 01/09/2021) for ADHD medications and BP check up .    Beatrice Lecher, MD

## 2020-10-03 NOTE — Assessment & Plan Note (Addendum)
Discussed options.  I am to have her increase her lisinopril HCTZ to 2 tabs so that we can increase the diuretic component and see if that actually improves her lower extremity swelling.  In the meantime she is can hold the doxazosin and see if that helps.  If she feels like she is tolerating this regimen well and decides that she wants to stay on it we can always send over new prescription for the higher strength lisinopril HCTZ.  In addition she will need to go to the lab to check a BMP in about 2 weeks.

## 2020-10-05 ENCOUNTER — Other Ambulatory Visit: Payer: Self-pay | Admitting: Family Medicine

## 2020-10-07 ENCOUNTER — Encounter: Payer: Self-pay | Admitting: Family Medicine

## 2020-10-07 ENCOUNTER — Telehealth (INDEPENDENT_AMBULATORY_CARE_PROVIDER_SITE_OTHER): Payer: BC Managed Care – PPO | Admitting: Family Medicine

## 2020-10-07 ENCOUNTER — Other Ambulatory Visit (HOSPITAL_BASED_OUTPATIENT_CLINIC_OR_DEPARTMENT_OTHER): Payer: Self-pay

## 2020-10-07 DIAGNOSIS — U071 COVID-19: Secondary | ICD-10-CM

## 2020-10-07 MED ORDER — MOLNUPIRAVIR EUA 200MG CAPSULE
4.0000 | ORAL_CAPSULE | Freq: Two times a day (BID) | ORAL | 0 refills | Status: AC
  Filled 2020-10-07: qty 40, 5d supply, fill #0

## 2020-10-07 MED ORDER — PREDNISONE 20 MG PO TABS
40.0000 mg | ORAL_TABLET | Freq: Every day | ORAL | 0 refills | Status: AC
  Filled 2020-10-07: qty 10, 5d supply, fill #0

## 2020-10-07 NOTE — Patient Instructions (Addendum)
Sending in prednisone and molnupiravir (antiviral for COVID infection). Molnupiravir patient fact sheet: RunningShows.fr Can continue over-the-counter cough, cold, and pain medicines. Let us know if you worsen or are not feeling better after several days.    Over the counter medications that may be helpful for symptoms:  Guaifenesin 1200 mg extended release tabs twice daily, with plenty of water For cough and congestion Brand name: Mucinex   Pseudoephedrine 30 mg, one or two tabs every 4 to 6 hours For sinus congestion Brand name: Sudafed You must get this from the pharmacy counter.  Oxymetazoline nasal spray each morning, one spray in each nostril, for NO MORE THAN 3 days  For nasal and sinus congestion Brand name: Afrin Saline nasal spray or Saline Nasal Irrigation 3-5 times a day For nasal and sinus congestion Brand names: Ocean or AYR Fluticasone nasal spray, one spray in each nostril, each morning (after oxymetazoline and saline, if used) For nasal and sinus congestion Brand name: Flonase Warm salt water gargles  For sore throat Every few hours as needed Alternate ibuprofen 400-600 mg and acetaminophen 1000 mg every 4-6 hours For fever, body aches, headache Brand names: Motrin or Advil and Tylenol Dextromethorphan 12-hour cough version 30 mg every 12 hours  For cough Brand name: Delsym Stop all other cold medications for now (Nyquil, Dayquil, Tylenol Cold, Theraflu, etc) and other non-prescription cough/cold preparations. Many of these have the same ingredients listed above and could cause an overdose of medication.   Herbal treatments that have been shown to be helpful in some patients include: Vitamin C 1000mg  per day Vitamin D 4000iU per day Zinc 100mg  per day Quercetin 25-500mg  twice a day Melatonin 5-10mg  at bedtime  General Instructions Allow your body to rest Drink PLENTY of fluids Isolate yourself from everyone, even family, until  test results have returned  If your COVID-19 test is positive Then you ARE INFECTED and you can pass the virus to others You must quarantine from others for a minimum of  10 days since symptoms started AND You are fever free for 24 hours WITHOUT any medication to reduce fever AND Your symptoms are improving Do not go to the store or other public areas Do not go around household members who are not known to be infected with COVID-19 If you MUST leave your area of quarantine (example: go to a bathroom you share with others in your home), you must Wear a mask Wash your hands thoroughly Wipe down any surfaces you touch Do not share food, drinks, towels, or other items with other persons Dispose of your own tissues, food containers, etc  Once you have recovered, please continue good preventive care measures, including:  wearing a mask when in public wash your hands frequently avoid touching your face/nose/eyes cover coughs/sneezes with the inside of your elbow stay out of crowds keep a 6 foot distance from others  If you develop severe shortness of breath, uncontrolled fevers, coughing up blood, confusion, chest pain, or signs of dehydration (such as significantly decreased urine amounts or dizziness with standing) please go to the ER.

## 2020-10-07 NOTE — Progress Notes (Signed)
Virtual Video Visit via MyChart Note  I connected with  Yolanda Mosley on 10/07/20 at  9:10 AM EDT by the video enabled telemedicine application for MyChart, and verified that I am speaking with the correct person using two identifiers.   I introduced myself as a Designer, jewellery with the practice. We discussed the limitations of evaluation and management by telemedicine and the availability of in person appointments. The patient expressed understanding and agreed to proceed.  Participating parties in this visit include: The patient and the nurse practitioner listed.  The patient is: At home I am: In the office - Primary Care Yolanda Mosley  Subjective:    CC:  Chief Complaint  Patient presents with   Covid Positive    HPI: Yolanda Mosley is a 40 y.o. year old female presenting today via [facility] today for positive COVID test.  On Tuesday, patient started to have a sore throat and some congestion. By Wednesday she also had body aches. When she woke up Thursday she felt even worse and has since started to have some wheezing and chest tightness (hx of asthma) and had to use nebs yesterday. Also experiencing some ear pain and rhinorrhea. Positive home COVID test yesterday. Has been taking Mucinex, Sudafed, and Zyrtec with some temporary improvement. She is not vaccinated against COVID.     Past medical history, Surgical history, Family history not pertinant except as noted below, Social history, Allergies, and medications have been entered into the medical record, reviewed, and corrections made.   Review of Systems:  All review of systems negative except what is listed in the HPI   Objective:    General:  Speaking clearly in complete sentences. Absent shortness of breath noted.   Alert and oriented x3.   Normal judgment.  Absent acute distress.   Impression and Recommendations:    1. COVID-19 Patient is eligible for antiviral therapy and would like to try - link to fact sheet attached  to AVS. Will also start prednisone given the wheezing and asthma history. Continue inhalers/nebs prn and OTC supportive measures. Patient aware of signs/symptoms requiring further/urgent evaluation.   - predniSONE (DELTASONE) 20 MG tablet; Take 2 tablets (40 mg total) by mouth daily with breakfast for 5 days.  Dispense: 10 tablet; Refill: 0 - molnupiravir EUA 200 mg CAPS; Take 4 capsules (800 mg total) by mouth 2 (two) times daily for 5 days.  Dispense: 40 capsule; Refill: 0   Follow-up if symptoms worsen or fail to improve.    I discussed the assessment and treatment plan with the patient. The patient was provided an opportunity to ask questions and all were answered. The patient agreed with the plan and demonstrated an understanding of the instructions.   The patient was advised to call back or seek an in-person evaluation if the symptoms worsen or if the condition fails to improve as anticipated.  I provided 20 minutes of non-face-to-face interaction with this [facility] visit including intake, same-day documentation, and chart review.   Terrilyn Saver, NP

## 2020-10-26 ENCOUNTER — Encounter: Payer: Self-pay | Admitting: Physician Assistant

## 2020-10-26 ENCOUNTER — Ambulatory Visit: Payer: BC Managed Care – PPO | Admitting: Family Medicine

## 2020-10-26 ENCOUNTER — Ambulatory Visit (INDEPENDENT_AMBULATORY_CARE_PROVIDER_SITE_OTHER): Payer: BC Managed Care – PPO | Admitting: Physician Assistant

## 2020-10-26 ENCOUNTER — Other Ambulatory Visit: Payer: Self-pay

## 2020-10-26 VITALS — BP 110/66 | HR 73 | Temp 97.8°F | Wt 230.0 lb

## 2020-10-26 DIAGNOSIS — M542 Cervicalgia: Secondary | ICD-10-CM

## 2020-10-26 DIAGNOSIS — Z8616 Personal history of COVID-19: Secondary | ICD-10-CM

## 2020-10-26 DIAGNOSIS — R221 Localized swelling, mass and lump, neck: Secondary | ICD-10-CM

## 2020-10-26 LAB — POCT RAPID STREP A (OFFICE): Rapid Strep A Screen: NEGATIVE

## 2020-10-26 MED ORDER — METHYLPREDNISOLONE 4 MG PO TBPK
ORAL_TABLET | ORAL | 0 refills | Status: DC
Start: 2020-10-26 — End: 2021-01-16

## 2020-10-26 MED ORDER — AMOXICILLIN-POT CLAVULANATE 875-125 MG PO TABS
1.0000 | ORAL_TABLET | Freq: Two times a day (BID) | ORAL | 0 refills | Status: DC
Start: 2020-10-26 — End: 2020-11-15

## 2020-10-26 NOTE — Progress Notes (Signed)
She reports that her sxs began 1 wk ago.

## 2020-10-26 NOTE — Progress Notes (Signed)
Subjective:    Patient ID: Yolanda Mosley, female    DOB: 05/21/1980, 40 y.o.   MRN: [id number]  HPI Pt is a 40 yo obese female who presents to the clinic with left sided neck pain and throat tenderness. She was seen virtually on 6/10 and tested positive for covid that day. She has had headache, congestion, weakness, fatigue. She was feeling better then her left side of neck felt full. She has a dry cough every so often. Able to swallow but feels light at times. No fever, chills, body aches. Left ear feels tight and popping.  No SOB.   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   Gastroesophageal reflux disease with esophagitis 08/16/2016   Mild persistent asthma without complication 56/31/4970   History of tachycardia 10/22/2016   Peripheral edema 10/22/2016   Venous stasis dermatitis of both lower extremities 10/23/2016   Mild mitral regurgitation by prior echocardiogram 11/14/2016   Post chemo evaluation 04/04/2015   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   Sleep apnea    Ovarian cyst    Essential hypertension    Herpes simplex virus (HSV) infection    GERD (gastroesophageal reflux disease)    Asthma    Anemia    Allergy    LVH (left ventricular hypertrophy) 06/25/2020   Chest pain of uncertain etiology 26/37/8588   Lower extremity edema 06/27/2020   Abnormal cardiac CT angiography    Subclinical hypothyroidism 08/10/2020   Resolved Ambulatory Problems    Diagnosis Date Noted   RINGWORM 04/03/2010    Scabies 04/03/2010   HYPERTENSION, MILD 01/09/2010   SINUSITIS - ACUTE-NOS 04/03/2010   VAGINITIS 01/09/2010   Otitis externa of right ear 07/21/2012   Viral syndrome 02/04/2014   Epicondylitis, lateral (tennis elbow) 01/24/2016   Posterior tibial tendonitis, left 07/12/2016   Acute diarrhea 10/29/2016   Erythematous skin nodule 11/29/2016   Hypertension    Uncontrolled hypertension 08/10/2020   Past Medical History:  Diagnosis Date   CA - cancer 2005   Hemorrhoids       Review of Systems See HPI.     Objective:   Physical Exam Vitals reviewed.  Constitutional:      Appearance: Normal appearance. She is obese.  HENT:     Head: Normocephalic.     Right Ear: Tympanic membrane, ear canal and external ear normal.     Left Ear: Tympanic membrane, ear canal and external ear normal.     Nose: Nose normal.     Mouth/Throat:     Mouth: Mucous membranes are moist.     Pharynx: Posterior oropharyngeal erythema present.     Comments: Bilateral large hypertrophy tonsils without exudate and uvula midline.  Eyes:     General:        Right eye: No discharge.        Left eye: No discharge.     Extraocular Movements: Extraocular movements intact.     Conjunctiva/sclera: Conjunctivae normal.     Pupils: Pupils are equal, round,  and reactive to light.  Neck:     Comments: No adenopathy palpated. Tenderness to palpation and some fullness of the left anterior cervical neck.  NROM.  Cardiovascular:     Rate and Rhythm: Normal rate and regular rhythm.     Pulses: Normal pulses.  Pulmonary:     Effort: Pulmonary effort is normal.     Breath sounds: Normal breath sounds. No stridor. No wheezing, rhonchi or rales.  Chest:     Chest wall: No tenderness.  Lymphadenopathy:     Cervical: No cervical adenopathy.  Neurological:     General: No focal deficit present.     Mental Status: She is alert and oriented to person, place, and time.  Psychiatric:        Mood and Affect: Mood normal.           Assessment & Plan:  Marland KitchenMarland KitchenDiagnoses and all orders for this visit:  Neck pain on left side -     POCT rapid strep A -     amoxicillin-clavulanate (AUGMENTIN) 875-125 MG tablet; Take 1 tablet by mouth 2 (two) times daily. -     methylPREDNISolone (MEDROL DOSEPAK) 4 MG TBPK tablet; Take as directed by package insert.  Neck fullness -     amoxicillin-clavulanate (AUGMENTIN) 875-125 MG tablet; Take 1 tablet by mouth 2 (two) times daily. -     methylPREDNISolone (MEDROL DOSEPAK) 4 MG TBPK tablet; Take as directed by package insert.  History of COVID-19 -     amoxicillin-clavulanate (AUGMENTIN) 875-125 MG tablet; Take 1 tablet by mouth 2 (two) times daily. -     methylPREDNISolone (MEDROL DOSEPAK) 4 MG TBPK tablet; Take as directed by package insert.  Although I suspect enlarged left neck lymph node I do not really feel anything there. Negative strep test. I suspect secondary infection after covid. Start augmentin and medrol dose pack. Continue symptomatic care with ibuprofen/cool compresses. If not improving will get neck u/s and cbc.

## 2020-11-14 ENCOUNTER — Telehealth: Payer: Self-pay | Admitting: *Deleted

## 2020-11-14 NOTE — Telephone Encounter (Signed)
Pt called requesting a refill of her Benazepril-hctz 20-12.5 due to it being increased to 2 tablets daily at her last ov on 10/03/2020 for bilateral swelling.  She reports that the swelling has gone down and hasn't had any more issues. She did have some outside labs done 10/26/2020. And a CMP was checked at that time her Creatinine was 0.70.   Will fwd to pcp for recommendations

## 2020-11-15 ENCOUNTER — Ambulatory Visit: Payer: BC Managed Care – PPO | Admitting: Internal Medicine

## 2020-11-15 MED ORDER — BENAZEPRIL-HYDROCHLOROTHIAZIDE 20-12.5 MG PO TABS: 2.0000 | ORAL_TABLET | Freq: Every day | ORAL | 1 refills | Status: DC

## 2020-11-15 NOTE — Telephone Encounter (Signed)
Meds ordered this encounter  Medications   benazepril-hydrochlorthiazide (LOTENSIN HCT) 20-12.5 MG tablet    Sig: Take 2 tablets by mouth daily.    Dispense:  180 tablet    Refill:  1

## 2020-11-15 NOTE — Progress Notes (Deleted)
Name: Yolanda Mosley  MRN/ DOB: [id number], 1980/06/02    Age/ Sex: 40 y.o., female     PCP: Hali Marry, MD   Reason for Endocrinology Evaluation: ***     Initial Endocrinology Clinic Visit: 08/09/2020    PATIENT IDENTIFIER: Yolanda Mosley is a 40 y.o., female with a past medical history of HTN. She has followed with [facility] since 08/11/2020 for consultative assistance with management of her ***.   HISTORICAL SUMMARY:  She was initially referred here for elevated Metanephrines      She had slight elevation of urinary normetanephrine's at 788 mcg/24 h, which is less than double the upper limit of normal. She had normal plasma metanephrines while OFF Vyvanse.   Slight elevation in urinary metanephrines  has been attributed to Vyvanse.   We  did discuss that being on Vyvanse since its a byproduct of amphetamine could cause an increase in her metanephrines / catecholamines   Of note the pt follows with cardiology for Chest pain, HTN and LVH, she has had extensive work up to include stress test, echo and cardiac cath.      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, follows with Oncology      She has HTN since her 20's , mother  with HTN as well   24-hr urinary cortisol and aldo came back normal at 9.5 mcg and   6 ng/dL respectively, normal aldo: renin ratio at 1.8 and normal renin at 3.36 ng/mL/hr  During her evaluation she was noted to have subclinical hypothyroidism with a TSH of 5.01 uIU/mL   SUBJECTIVE:    Today (11/15/2020):  Yolanda Mosley is here for      HISTORY:  Past Medical History:  Past Medical History:  Diagnosis Date   ADHD 03/01/2011   ALLERGIC RHINITIS 01/09/2010   Qualifier: Diagnosis of  By: Madilyn Fireman MD, Catherine     Allergy    Anemia    Anemia, iron deficiency 09/09/2013   Asthma    Bilateral low back pain without sciatica 08/10/2014   CA - cancer 2005   Germ Cell Tumor - Rt Ovary   DDD  (degenerative disc disease), lumbar 08/11/2014   L5 and S1 via xrays 07/2014.     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  CPAP 7 cm water pressure.  Mild OSA AHI 8.7.    Ovarian cyst    left   Peripheral edema 10/22/2016   Post chemo evaluation 04/04/2015   Sleep apnea    Tachycardia    Venous stasis dermatitis  of both lower extremities 10/23/2016   Past Surgical History:  Past Surgical History:  Procedure Laterality Date   LEFT HEART CATH AND CORONARY ANGIOGRAPHY N/A 07/14/2020   Procedure: LEFT HEART CATH AND CORONARY ANGIOGRAPHY;  Surgeon: Troy Sine, MD;  Location: [facility] [place] [facility];  Service: Cardiovascular;  Laterality: N/A;   OMENTECTOMY  2005   ovary, bilater fallopian tubes removed  2005   SALPINGOOPHORECTOMY Right    Social History:  reports that she has never smoked. She has never used smokeless tobacco. She reports that she does not drink alcohol and does not use drugs. Family History:  Family History  Problem Relation Age of Onset   Hypertension Mother    Diabetes Other    Other Other        cardiovascular disorder     HOME  MEDICATIONS: Allergies as of 11/15/2020       Reactions   Ibuprofen    Severe bleeding   Other Rash   Hydrocodone Other (See Comments)   Dizzy , nauseous, swimmy headed.(Can take with phenergan)   Keflex [cephalexin]    Diarrhea and more abdominal cramping Diarrhea and more abdominal cramping   Codeine Nausea Only   Tape Rash   Plastic tape, tegaderm         Medication List        Accurate as of November 15, 2020  6:57 AM. If you have any questions, ask your nurse or doctor.          albuterol 108 (90 Base) MCG/ACT inhaler Commonly known as: VENTOLIN HFA Inhale 2 puffs into the lungs every 6 (six) hours as needed for wheezing or shortness of breath.   amoxicillin-clavulanate 875-125 MG tablet Commonly known as: Augmentin Take 1 tablet by mouth 2 (two) times daily.   atenolol 50 MG tablet Commonly known as: TENORMIN Take 25 mg by mouth daily.   benazepril-hydrochlorthiazide 20-12.5 MG tablet Commonly known as: LOTENSIN HCT Take 1 tablet by mouth daily.   cetirizine 10 MG tablet Commonly known as: ZYRTEC Take 10 mg by mouth daily.   diltiazem 120 MG 24 hr capsule Commonly known as: CARDIZEM CD Take 1 capsule (120 mg total) by mouth daily.   doxazosin 2 MG tablet Commonly known as: CARDURA Take 1 tablet by mouth once daily   lisdexamfetamine 70 MG capsule Commonly known as: Vyvanse Take 1 capsule (70 mg total) by mouth daily before breakfast.   lisdexamfetamine 70 MG capsule Commonly known as: VYVANSE Take 1 capsule (70 mg total) by mouth daily. Start taking on: November 19, 2020   lisdexamfetamine 70 MG capsule Commonly known as: Vyvanse Take 1 capsule (70 mg total) by mouth daily. Start taking on: December 19, 2020   methylPREDNISolone 4 MG Tbpk tablet Commonly known as: MEDROL DOSEPAK Take as directed by package insert.   triamcinolone cream 0.1 % Commonly known as: KENALOG Apply 1 application topically 2 (two) times daily.   valACYclovir 1000 MG  tablet Commonly known as: VALTREX Take 1 tablet by mouth twice daily          OBJECTIVE:   PHYSICAL EXAM: VS: There were no vitals taken for this visit.   EXAM: General: Pt appears well and is in NAD  Hydration: Well-hydrated with moist mucous membranes and good skin turgor  Eyes: External eye exam normal without stare, lid lag or exophthalmos.  EOM intact.  PERRL.  Ears, Nose, Throat: Hearing: Grossly intact bilaterally Dental: Good dentition  Throat: Clear without mass, erythema  or exudate  Neck: General: Supple without adenopathy. Thyroid: Thyroid size normal.  No goiter or nodules appreciated. No thyroid bruit.  Lungs: Clear with good BS bilat with no rales, rhonchi, or wheezes  Heart: Auscultation: RRR.  Abdomen: Normoactive bowel sounds, soft, nontender, without masses or organomegaly palpable  Extremities: Gait and station: Normal gait  Digits and nails: No clubbing, cyanosis, petechiae, or nodes Head and neck: Normal alignment and mobility BL UE: Normal ROM and strength. BL LE: No pretibial edema normal ROM and strength.  Skin: Hair: Texture and amount normal with gender appropriate distribution Skin Inspection: No rashes, acanthosis nigricans/skin tags. No lipohypertrophy Skin Palpation: Skin temperature, texture, and thickness normal to palpation  Neuro: Cranial nerves: II - XII grossly intact  Cerebellar: Normal coordination and movement; no tremor Motor: Normal strength throughout DTRs: 2+ and symmetric in UE without delay in relaxation phase  Mental Status: Judgment, insight: Intact Orientation: Oriented to time, place, and person Memory: Intact for recent and remote events Mood and affect: No depression, anxiety, or agitation     DATA REVIEWED: ***    ASSESSMENT / PLAN / RECOMMENDATIONS:   ***  Plan: ***    Medications   ***   Signed electronically by: Mack Guise, MD  [facility]  [address] [place], [place] [address] Phone: [phone] FAX: [phone]      CC: Hali Marry, [address] [place] [address] Phone: [phone]  Fax: [phone]   Return to Endocrinology clinic as below: Future Appointments  Date Time Provider [place]  11/15/2020 11:10 AM Zayveon Raschke, Melanie Crazier, MD [facility]  01/16/2021  9:10 AM Hali Marry, MD [facility] None

## 2020-11-21 ENCOUNTER — Telehealth: Payer: Self-pay

## 2020-11-21 DIAGNOSIS — Z006 Encounter for examination for normal comparison and control in clinical research program: Secondary | ICD-10-CM

## 2020-11-21 NOTE — Telephone Encounter (Signed)
I have attempted without success to contact this patient by phone for her Identify 90 day follow up phone call. I left a message for patient to return my phone call with my name and callback number. An e-mail was also sent to patient.  

## 2021-01-16 ENCOUNTER — Ambulatory Visit (INDEPENDENT_AMBULATORY_CARE_PROVIDER_SITE_OTHER): Payer: BC Managed Care – PPO | Admitting: Family Medicine

## 2021-01-16 ENCOUNTER — Other Ambulatory Visit: Payer: Self-pay

## 2021-01-16 ENCOUNTER — Encounter: Payer: Self-pay | Admitting: Family Medicine

## 2021-01-16 VITALS — BP 109/59 | HR 70 | Ht 64.0 in | Wt 223.0 lb

## 2021-01-16 DIAGNOSIS — Z23 Encounter for immunization: Secondary | ICD-10-CM

## 2021-01-16 DIAGNOSIS — I10 Essential (primary) hypertension: Secondary | ICD-10-CM | POA: Diagnosis not present

## 2021-01-16 DIAGNOSIS — C561 Malignant neoplasm of right ovary: Secondary | ICD-10-CM | POA: Diagnosis not present

## 2021-01-16 DIAGNOSIS — N946 Dysmenorrhea, unspecified: Secondary | ICD-10-CM | POA: Diagnosis not present

## 2021-01-16 DIAGNOSIS — N940 Mittelschmerz: Secondary | ICD-10-CM | POA: Diagnosis not present

## 2021-01-16 DIAGNOSIS — Z124 Encounter for screening for malignant neoplasm of cervix: Secondary | ICD-10-CM | POA: Diagnosis not present

## 2021-01-16 DIAGNOSIS — N83202 Unspecified ovarian cyst, left side: Secondary | ICD-10-CM | POA: Diagnosis not present

## 2021-01-16 DIAGNOSIS — F902 Attention-deficit hyperactivity disorder, combined type: Secondary | ICD-10-CM

## 2021-01-16 DIAGNOSIS — M7918 Myalgia, other site: Secondary | ICD-10-CM | POA: Diagnosis not present

## 2021-01-16 MED ORDER — LISDEXAMFETAMINE DIMESYLATE 70 MG PO CAPS: 70.0000 mg | ORAL_CAPSULE | Freq: Every day | ORAL | 0 refills | Status: DC

## 2021-01-16 MED ORDER — LISDEXAMFETAMINE DIMESYLATE 70 MG PO CAPS
70.0000 mg | ORAL_CAPSULE | Freq: Every day | ORAL | 0 refills | Status: DC
Start: 2021-01-16 — End: 2021-05-05

## 2021-01-16 NOTE — Assessment & Plan Note (Signed)
Well controlled. Continue current regimen. Follow up in  4 months.   

## 2021-01-16 NOTE — Progress Notes (Signed)
Established Patient Office Visit  Subjective:  Patient ID: Yolanda Mosley, female    DOB: 02-20-1981  Age: 40 y.o. MRN: [id number]  CC:  Chief Complaint  Patient presents with   ADHD   tdap    HPI [name] presents for    Hypertension- Pt denies chest pain, SOB, dizziness, or heart palpitations.  Taking meds as directed w/o problems.  Denies medication side effects.    ADHD - Reports symptoms are well controlled on current regime. Denies any problems with insomnia, chest pain, palpitations, or SOB.    Dealing with a cyst on her left ovary.  She says it will be really painful for days and then she might get relief for a couple of weeks and seems to be cycling.  She has a known cyst on that ovary so is actually meeting with the surgeon later this morning to discuss removal.  Past Medical History:  Diagnosis Date   ADHD 03/01/2011   ALLERGIC RHINITIS 01/09/2010   Qualifier: Diagnosis of  By: Madilyn Fireman MD, Melaney Tellefsen     Allergy    Anemia    Anemia, iron deficiency 09/09/2013   Asthma    Bilateral low back pain without sciatica 08/10/2014   CA - cancer 2005   Germ Cell Tumor - Rt Ovary   DDD (degenerative disc disease), lumbar 08/11/2014   L5 and S1 via xrays 07/2014.     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  CPAP 7 cm water pressure.  Mild OSA AHI 8.7.    Ovarian cyst    left   Peripheral edema 10/22/2016   Post chemo evaluation 04/04/2015   Sleep apnea    Tachycardia    Venous stasis dermatitis of both lower extremities 10/23/2016    Past Surgical History:  Procedure Laterality Date   LEFT HEART CATH AND CORONARY ANGIOGRAPHY N/A 07/14/2020   Procedure: LEFT HEART CATH AND CORONARY ANGIOGRAPHY;  Surgeon: Troy Sine, MD;  Location: [facility];  Service: Cardiovascular;  Laterality: N/A;   OMENTECTOMY  2005   ovary, bilater fallopian tubes removed  2005   SALPINGOOPHORECTOMY Right     Family History  Problem Relation Age of Onset   Hypertension Mother    Diabetes Other    Other Other        cardiovascular disorder    Social History   Socioeconomic History   Marital status: Single    Spouse name: Not on file   Number of children: Not on file   Years of education: Not on file  Highest education level: Not on file  Occupational History   Not on file  Tobacco Use   Smoking status: Never   Smokeless tobacco: Never  Substance and Sexual Activity   Alcohol use: No   Drug use: No   Sexual activity: Yes    Birth control/protection: None  Other Topics Concern   Not on file  Social History Narrative   Not on file   Social Determinants of Health   Financial Resource Strain: Not on file  Food Insecurity: Not on file  Transportation Needs: Not on file  Physical Activity: Not on file  Stress: Not on file  Social Connections: Not on file  Intimate Partner Violence: Not on file    Outpatient Medications Prior to Visit  Medication Sig Dispense Refill   albuterol (VENTOLIN HFA) 108 (90 Base) MCG/ACT inhaler Inhale 2 puffs into the lungs every 6 (six) hours as needed for wheezing or shortness  of breath.     atenolol (TENORMIN) 50 MG tablet Take 25 mg by mouth daily.     benazepril-hydrochlorthiazide (LOTENSIN HCT) 20-12.5 MG tablet Take 2 tablets by mouth daily. 180 tablet 1   cetirizine (ZYRTEC) 10 MG tablet Take 10 mg by mouth daily.     diltiazem (CARDIZEM CD) 120 MG 24 hr capsule Take 1 capsule (120 mg total) by mouth daily. 90 capsule 3   doxazosin (CARDURA) 2 MG tablet Take 1 tablet by mouth once daily 30 tablet 1   valACYclovir (VALTREX) 1000 MG tablet Take 1 tablet by mouth twice daily 20 tablet 0   lisdexamfetamine (VYVANSE) 70 MG capsule Take 1 capsule (70 mg total) by mouth daily. 30 capsule 0   lisdexamfetamine (VYVANSE) 70 MG capsule Take 1 capsule (70 mg total) by mouth daily before breakfast. 30 capsule 0   lisdexamfetamine (VYVANSE) 70 MG capsule Take 1 capsule (70 mg total) by mouth daily. 30 capsule 0   methylPREDNISolone (MEDROL DOSEPAK) 4 MG TBPK tablet Take as directed by package insert. 21 tablet 0   triamcinolone cream (KENALOG) 0.1 % Apply 1 application topically 2 (two) times daily. 30 g 0   No facility-administered medications prior to visit.    Allergies  Allergen Reactions   Ibuprofen     Severe bleeding   Other Rash   Hydrocodone Other (See Comments)    Dizzy , nauseous, swimmy headed.(Can take with phenergan)   Keflex [Cephalexin]     Diarrhea and more abdominal cramping Diarrhea and more abdominal cramping   Codeine Nausea Only   Tape Rash    Plastic tape, tegaderm     ROS Review of Systems    Objective:    Physical Exam Constitutional:      Appearance: Normal appearance. She is well-developed.  HENT:     Head: Normocephalic and atraumatic.  Cardiovascular:     Rate and Rhythm: Normal rate and regular rhythm.     Heart sounds: Normal heart sounds.  Pulmonary:     Effort: Pulmonary effort is normal.     Breath sounds: Normal breath sounds.  Skin:    General: Skin is warm and dry.  Neurological:     Mental Status: She is  alert and oriented to person, place, and time.  Psychiatric:        Behavior: Behavior normal.    BP (!) 109/59   Pulse 70   Ht _0  (1.626 m)   Wt 223 lb (101.2 kg)   SpO2 99%   BMI 38.28 kg/m  Wt  Readings from Last 3 Encounters:  01/16/21 223 lb (101.2 kg)  10/26/20 230 lb (104.3 kg)  10/03/20 231 lb (104.8 kg)     Health Maintenance Due  Topic Date Due   Hepatitis C Screening  Never done    There are no preventive care reminders to display for this patient.  Lab Results  Component Value Date   TSH 5.01 (H) 06/13/2020   Lab Results  Component Value Date   WBC 5.8 07/08/2020   HGB 13.9 07/08/2020   HCT 41.3 07/08/2020   MCV 87 07/08/2020   PLT 216 07/08/2020   Lab Results  Component Value Date   NA 140 07/08/2020   K 4.1 07/08/2020   CO2 23 07/08/2020   GLUCOSE 95 07/08/2020   BUN 15 07/08/2020   CREATININE 0.65 07/08/2020   BILITOT 0.4 03/28/2020   ALKPHOS 109 11/04/2015   AST 17 03/28/2020   ALT 22 03/28/2020   PROT 7.5 03/28/2020   ALBUMIN 4.0 11/04/2015   CALCIUM 9.7 07/08/2020   EGFR 115 07/08/2020   Lab Results  Component Value Date   CHOL 156 03/28/2020   Lab Results  Component Value Date   HDL 50 03/28/2020   Lab Results  Component Value Date   LDLCALC 86 03/28/2020   Lab Results  Component Value Date   TRIG 100 03/28/2020   Lab Results  Component Value Date   CHOLHDL 3.1 03/28/2020   No results found for: HGBA1C    Assessment & Plan:   Problem List Items Addressed This Visit       Cardiovascular and Mediastinum   Essential hypertension - Primary    BP borderline low today.  But she took her blood pressure medication and not her Vyvanse she normally takes them at the same time a little later in the morning because she works second shift.  But she got up early to come to this appointment and then she actually has a surgery appointment later.  I just encouraged her to keep an eye on it over the next couple of weeks.   Certainly if she is taking it with the Vyvanse her pressures might be a little bit higher.  She says she does feel a little lightheaded this morning.        Other   ADHD    Well controlled. Continue current regimen. Follow up in  4 months.        Relevant Medications   lisdexamfetamine (VYVANSE) 70 MG capsule   lisdexamfetamine (VYVANSE) 70 MG capsule   lisdexamfetamine (VYVANSE) 70 MG capsule   Other Visit Diagnoses     Need for tetanus, diphtheria, and acellular pertussis (Tdap) vaccine in patient of adolescent age or older       Relevant Orders   Tdap vaccine greater than or equal to 7yo IM (Completed)   Left ovarian cyst           Left ovarian cyst-we will likely have surgery later this fall.  She does have a prior history of a dysgerminoma in the right ovary she is really hoping to at least keep part of her left ovary if at all possible so it does not put her into premature menopause.  Hopefully the lesion is benign.  Meds ordered this encounter  Medications   lisdexamfetamine (VYVANSE) 70 MG capsule    Sig: Take 1 capsule (70 mg total) by mouth daily.    Dispense:  30 capsule    Refill:  0   lisdexamfetamine (  VYVANSE) 70 MG capsule    Sig: Take 1 capsule (70 mg total) by mouth daily before breakfast.    Dispense:  30 capsule    Refill:  0   lisdexamfetamine (VYVANSE) 70 MG capsule    Sig: Take 1 capsule (70 mg total) by mouth daily.    Dispense:  30 capsule    Refill:  0    Follow-up: No follow-ups on file.    Beatrice Lecher, MD

## 2021-01-16 NOTE — Assessment & Plan Note (Signed)
BP borderline low today.  But she took her blood pressure medication and not her Vyvanse she normally takes them at the same time a little later in the morning because she works second shift.  But she got up early to come to this appointment and then she actually has a surgery appointment later.  I just encouraged her to keep an eye on it over the next couple of weeks.  Certainly if she is taking it with the Vyvanse her pressures might be a little bit higher.  She says she does feel a little lightheaded this morning.

## 2021-01-17 DIAGNOSIS — Z885 Allergy status to narcotic agent status: Secondary | ICD-10-CM | POA: Diagnosis not present

## 2021-01-17 DIAGNOSIS — Z79899 Other long term (current) drug therapy: Secondary | ICD-10-CM | POA: Diagnosis not present

## 2021-01-17 DIAGNOSIS — Z6841 Body Mass Index (BMI) 40.0 and over, adult: Secondary | ICD-10-CM | POA: Diagnosis not present

## 2021-01-17 DIAGNOSIS — G4733 Obstructive sleep apnea (adult) (pediatric): Secondary | ICD-10-CM | POA: Diagnosis not present

## 2021-01-17 DIAGNOSIS — Z91048 Other nonmedicinal substance allergy status: Secondary | ICD-10-CM | POA: Diagnosis not present

## 2021-01-17 DIAGNOSIS — E669 Obesity, unspecified: Secondary | ICD-10-CM | POA: Diagnosis not present

## 2021-01-17 DIAGNOSIS — Z20822 Contact with and (suspected) exposure to covid-19: Secondary | ICD-10-CM | POA: Diagnosis not present

## 2021-01-17 DIAGNOSIS — Z886 Allergy status to analgesic agent status: Secondary | ICD-10-CM | POA: Diagnosis not present

## 2021-01-17 DIAGNOSIS — N83292 Other ovarian cyst, left side: Secondary | ICD-10-CM | POA: Diagnosis not present

## 2021-01-17 DIAGNOSIS — J45909 Unspecified asthma, uncomplicated: Secondary | ICD-10-CM | POA: Diagnosis not present

## 2021-01-17 DIAGNOSIS — R7989 Other specified abnormal findings of blood chemistry: Secondary | ICD-10-CM | POA: Diagnosis not present

## 2021-01-17 DIAGNOSIS — R102 Pelvic and perineal pain: Secondary | ICD-10-CM | POA: Diagnosis not present

## 2021-01-17 DIAGNOSIS — I10 Essential (primary) hypertension: Secondary | ICD-10-CM | POA: Diagnosis not present

## 2021-01-17 DIAGNOSIS — F988 Other specified behavioral and emotional disorders with onset usually occurring in childhood and adolescence: Secondary | ICD-10-CM | POA: Diagnosis not present

## 2021-01-17 DIAGNOSIS — N83202 Unspecified ovarian cyst, left side: Secondary | ICD-10-CM | POA: Diagnosis not present

## 2021-01-17 DIAGNOSIS — R0602 Shortness of breath: Secondary | ICD-10-CM | POA: Diagnosis not present

## 2021-01-17 DIAGNOSIS — R079 Chest pain, unspecified: Secondary | ICD-10-CM | POA: Diagnosis not present

## 2021-01-17 DIAGNOSIS — Z881 Allergy status to other antibiotic agents status: Secondary | ICD-10-CM | POA: Diagnosis not present

## 2021-01-18 ENCOUNTER — Telehealth: Payer: Self-pay | Admitting: General Practice

## 2021-01-18 NOTE — Telephone Encounter (Signed)
Transition Care Management Follow-up Telephone Call Date of discharge and from where: 01/18/21 from [facility] How have you been since you were released from the hospital? Still in pain but has been in touch with her OB/GYN surgeon.  Any questions or concerns? No  Items Reviewed: Did the pt receive and understand the discharge instructions provided? Yes  Medications obtained and verified? Yes  Other? No  Any new allergies since your discharge? No  Dietary orders reviewed? Yes Do you have support at home? Yes   Home Care and Equipment/Supplies: Were home health services ordered? no  Functional Questionnaire: (I = Independent and D = Dependent) ADLs: I  Bathing/Dressing- I  Meal Prep- I  Eating- I  Maintaining continence- I  Transferring/Ambulation- I  Managing Meds- I  Follow up appointments reviewed:  PCP Hospital f/u appt confirmed? No   Specialist Hospital f/u appt confirmed?  Patient called OB/GYN and they are working on getting her scheduled for surgery.   Are transportation arrangements needed? No  If their condition worsens, is the pt aware to call PCP or go to the Emergency Dept.? Yes Was the patient provided with contact information for the PCP's office or ED? Yes Was to pt encouraged to call back with questions or concerns? Yes

## 2021-01-19 DIAGNOSIS — M7918 Myalgia, other site: Secondary | ICD-10-CM | POA: Diagnosis not present

## 2021-01-19 DIAGNOSIS — R278 Other lack of coordination: Secondary | ICD-10-CM | POA: Diagnosis not present

## 2021-01-19 DIAGNOSIS — M62838 Other muscle spasm: Secondary | ICD-10-CM | POA: Diagnosis not present

## 2021-01-24 DIAGNOSIS — M62838 Other muscle spasm: Secondary | ICD-10-CM | POA: Diagnosis not present

## 2021-01-24 DIAGNOSIS — R278 Other lack of coordination: Secondary | ICD-10-CM | POA: Diagnosis not present

## 2021-01-24 DIAGNOSIS — M7918 Myalgia, other site: Secondary | ICD-10-CM | POA: Diagnosis not present

## 2021-01-26 DIAGNOSIS — C561 Malignant neoplasm of right ovary: Secondary | ICD-10-CM | POA: Diagnosis not present

## 2021-01-26 DIAGNOSIS — Z8543 Personal history of malignant neoplasm of ovary: Secondary | ICD-10-CM | POA: Diagnosis not present

## 2021-01-26 DIAGNOSIS — C569 Malignant neoplasm of unspecified ovary: Secondary | ICD-10-CM | POA: Diagnosis not present

## 2021-01-26 DIAGNOSIS — D509 Iron deficiency anemia, unspecified: Secondary | ICD-10-CM | POA: Diagnosis not present

## 2021-01-26 DIAGNOSIS — N83209 Unspecified ovarian cyst, unspecified side: Secondary | ICD-10-CM | POA: Diagnosis not present

## 2021-01-26 DIAGNOSIS — D508 Other iron deficiency anemias: Secondary | ICD-10-CM | POA: Diagnosis not present

## 2021-01-26 DIAGNOSIS — R7989 Other specified abnormal findings of blood chemistry: Secondary | ICD-10-CM | POA: Diagnosis not present

## 2021-01-31 ENCOUNTER — Other Ambulatory Visit: Payer: Self-pay | Admitting: Nurse Practitioner

## 2021-02-02 DIAGNOSIS — R278 Other lack of coordination: Secondary | ICD-10-CM | POA: Diagnosis not present

## 2021-02-02 DIAGNOSIS — M62838 Other muscle spasm: Secondary | ICD-10-CM | POA: Diagnosis not present

## 2021-02-02 DIAGNOSIS — M7918 Myalgia, other site: Secondary | ICD-10-CM | POA: Diagnosis not present

## 2021-02-08 ENCOUNTER — Other Ambulatory Visit: Payer: Self-pay | Admitting: *Deleted

## 2021-02-09 DIAGNOSIS — M62838 Other muscle spasm: Secondary | ICD-10-CM | POA: Diagnosis not present

## 2021-02-09 DIAGNOSIS — M7918 Myalgia, other site: Secondary | ICD-10-CM | POA: Diagnosis not present

## 2021-02-09 DIAGNOSIS — R278 Other lack of coordination: Secondary | ICD-10-CM | POA: Diagnosis not present

## 2021-02-16 DIAGNOSIS — M62838 Other muscle spasm: Secondary | ICD-10-CM | POA: Diagnosis not present

## 2021-02-16 DIAGNOSIS — R278 Other lack of coordination: Secondary | ICD-10-CM | POA: Diagnosis not present

## 2021-02-16 DIAGNOSIS — M7918 Myalgia, other site: Secondary | ICD-10-CM | POA: Diagnosis not present

## 2021-02-23 DIAGNOSIS — R278 Other lack of coordination: Secondary | ICD-10-CM | POA: Diagnosis not present

## 2021-02-23 DIAGNOSIS — M7918 Myalgia, other site: Secondary | ICD-10-CM | POA: Diagnosis not present

## 2021-02-23 DIAGNOSIS — M62838 Other muscle spasm: Secondary | ICD-10-CM | POA: Diagnosis not present

## 2021-03-10 DIAGNOSIS — R278 Other lack of coordination: Secondary | ICD-10-CM | POA: Diagnosis not present

## 2021-03-10 DIAGNOSIS — M7918 Myalgia, other site: Secondary | ICD-10-CM | POA: Diagnosis not present

## 2021-03-10 DIAGNOSIS — M62838 Other muscle spasm: Secondary | ICD-10-CM | POA: Diagnosis not present

## 2021-03-16 DIAGNOSIS — N83202 Unspecified ovarian cyst, left side: Secondary | ICD-10-CM | POA: Diagnosis not present

## 2021-03-16 DIAGNOSIS — C561 Malignant neoplasm of right ovary: Secondary | ICD-10-CM | POA: Diagnosis not present

## 2021-03-16 DIAGNOSIS — M7918 Myalgia, other site: Secondary | ICD-10-CM | POA: Diagnosis not present

## 2021-03-25 ENCOUNTER — Other Ambulatory Visit: Payer: Self-pay | Admitting: Family Medicine

## 2021-03-27 ENCOUNTER — Other Ambulatory Visit: Payer: Self-pay | Admitting: *Deleted

## 2021-04-03 DIAGNOSIS — Z8543 Personal history of malignant neoplasm of ovary: Secondary | ICD-10-CM | POA: Diagnosis not present

## 2021-04-11 DIAGNOSIS — R079 Chest pain, unspecified: Secondary | ICD-10-CM | POA: Diagnosis not present

## 2021-04-11 DIAGNOSIS — N9489 Other specified conditions associated with female genital organs and menstrual cycle: Secondary | ICD-10-CM | POA: Diagnosis not present

## 2021-04-11 DIAGNOSIS — K660 Peritoneal adhesions (postprocedural) (postinfection): Secondary | ICD-10-CM | POA: Diagnosis not present

## 2021-04-11 DIAGNOSIS — L259 Unspecified contact dermatitis, unspecified cause: Secondary | ICD-10-CM | POA: Diagnosis not present

## 2021-04-11 DIAGNOSIS — Z79899 Other long term (current) drug therapy: Secondary | ICD-10-CM | POA: Diagnosis not present

## 2021-04-11 DIAGNOSIS — N83202 Unspecified ovarian cyst, left side: Secondary | ICD-10-CM | POA: Diagnosis not present

## 2021-04-11 DIAGNOSIS — R1084 Generalized abdominal pain: Secondary | ICD-10-CM | POA: Diagnosis not present

## 2021-04-11 DIAGNOSIS — Z4659 Encounter for fitting and adjustment of other gastrointestinal appliance and device: Secondary | ICD-10-CM | POA: Diagnosis not present

## 2021-04-11 DIAGNOSIS — R141 Gas pain: Secondary | ICD-10-CM | POA: Diagnosis not present

## 2021-04-11 DIAGNOSIS — N736 Female pelvic peritoneal adhesions (postinfective): Secondary | ICD-10-CM | POA: Diagnosis not present

## 2021-04-11 DIAGNOSIS — F909 Attention-deficit hyperactivity disorder, unspecified type: Secondary | ICD-10-CM | POA: Diagnosis not present

## 2021-04-11 DIAGNOSIS — K529 Noninfective gastroenteritis and colitis, unspecified: Secondary | ICD-10-CM | POA: Diagnosis not present

## 2021-04-11 DIAGNOSIS — A6000 Herpesviral infection of urogenital system, unspecified: Secondary | ICD-10-CM | POA: Diagnosis not present

## 2021-04-11 DIAGNOSIS — Z90721 Acquired absence of ovaries, unilateral: Secondary | ICD-10-CM | POA: Diagnosis not present

## 2021-04-11 DIAGNOSIS — E8770 Fluid overload, unspecified: Secondary | ICD-10-CM | POA: Diagnosis not present

## 2021-04-11 DIAGNOSIS — G4733 Obstructive sleep apnea (adult) (pediatric): Secondary | ICD-10-CM | POA: Diagnosis not present

## 2021-04-11 DIAGNOSIS — N83292 Other ovarian cyst, left side: Secondary | ICD-10-CM | POA: Diagnosis not present

## 2021-04-11 DIAGNOSIS — R918 Other nonspecific abnormal finding of lung field: Secondary | ICD-10-CM | POA: Diagnosis not present

## 2021-04-11 DIAGNOSIS — T7840XA Allergy, unspecified, initial encounter: Secondary | ICD-10-CM | POA: Diagnosis not present

## 2021-04-11 DIAGNOSIS — J45909 Unspecified asthma, uncomplicated: Secondary | ICD-10-CM | POA: Diagnosis not present

## 2021-04-11 DIAGNOSIS — R000 Tachycardia, unspecified: Secondary | ICD-10-CM | POA: Diagnosis not present

## 2021-04-11 DIAGNOSIS — F419 Anxiety disorder, unspecified: Secondary | ICD-10-CM | POA: Diagnosis not present

## 2021-04-11 DIAGNOSIS — R188 Other ascites: Secondary | ICD-10-CM | POA: Diagnosis not present

## 2021-04-11 DIAGNOSIS — K668 Other specified disorders of peritoneum: Secondary | ICD-10-CM | POA: Diagnosis not present

## 2021-04-11 DIAGNOSIS — I10 Essential (primary) hypertension: Secondary | ICD-10-CM | POA: Diagnosis not present

## 2021-04-11 DIAGNOSIS — R0682 Tachypnea, not elsewhere classified: Secondary | ICD-10-CM | POA: Diagnosis not present

## 2021-04-11 DIAGNOSIS — K9131 Postprocedural partial intestinal obstruction: Secondary | ICD-10-CM | POA: Diagnosis not present

## 2021-04-11 DIAGNOSIS — R933 Abnormal findings on diagnostic imaging of other parts of digestive tract: Secondary | ICD-10-CM | POA: Diagnosis not present

## 2021-04-11 DIAGNOSIS — K6389 Other specified diseases of intestine: Secondary | ICD-10-CM | POA: Diagnosis not present

## 2021-04-11 DIAGNOSIS — K56600 Partial intestinal obstruction, unspecified as to cause: Secondary | ICD-10-CM | POA: Diagnosis not present

## 2021-04-11 DIAGNOSIS — K913 Postprocedural intestinal obstruction, unspecified as to partial versus complete: Secondary | ICD-10-CM | POA: Diagnosis not present

## 2021-04-21 ENCOUNTER — Telehealth: Payer: Self-pay | Admitting: General Practice

## 2021-04-21 NOTE — Telephone Encounter (Signed)
Transition Care Management Follow-up Telephone Call Date of discharge and from where: 04/20/21 from [facility] How have you been since you were released from the hospital? She is recovering well. Has an appt to see the surgeon on the 05/04/21. Any questions or concerns? No  Items Reviewed: Did the pt receive and understand the discharge instructions provided? Yes  Medications obtained and verified? No  Other? No  Any new allergies since your discharge? No  Dietary orders reviewed? Yes Do you have support at home? Yes   Home Care and Equipment/Supplies: Were home health services ordered? no  Functional Questionnaire: (I = Independent and D = Dependent) ADLs: I  Bathing/Dressing- I  Meal Prep- II  Eating- I  Maintaining continence- I  Transferring/Ambulation- I  Managing Meds- I  Follow up appointments reviewed:  PCP Hospital f/u appt confirmed? No   Specialist Hospital f/u appt confirmed? Yes  Scheduled to see the surgeon on 05/04/21. Are transportation arrangements needed? No  If their condition worsens, is the pt aware to call PCP or go to the Emergency Dept.? Yes Was the patient provided with contact information for the PCP's office or ED? Yes Was to pt encouraged to call back with questions or concerns? Yes

## 2021-04-25 DIAGNOSIS — K668 Other specified disorders of peritoneum: Secondary | ICD-10-CM | POA: Diagnosis not present

## 2021-04-25 DIAGNOSIS — N739 Female pelvic inflammatory disease, unspecified: Secondary | ICD-10-CM | POA: Diagnosis not present

## 2021-04-25 DIAGNOSIS — Z20822 Contact with and (suspected) exposure to covid-19: Secondary | ICD-10-CM | POA: Diagnosis not present

## 2021-04-25 DIAGNOSIS — Z79899 Other long term (current) drug therapy: Secondary | ICD-10-CM | POA: Diagnosis not present

## 2021-04-25 DIAGNOSIS — I10 Essential (primary) hypertension: Secondary | ICD-10-CM | POA: Diagnosis not present

## 2021-04-25 DIAGNOSIS — J45909 Unspecified asthma, uncomplicated: Secondary | ICD-10-CM | POA: Diagnosis not present

## 2021-04-25 DIAGNOSIS — R0789 Other chest pain: Secondary | ICD-10-CM | POA: Diagnosis not present

## 2021-04-25 DIAGNOSIS — F909 Attention-deficit hyperactivity disorder, unspecified type: Secondary | ICD-10-CM | POA: Diagnosis not present

## 2021-04-25 DIAGNOSIS — Z3202 Encounter for pregnancy test, result negative: Secondary | ICD-10-CM | POA: Diagnosis not present

## 2021-04-25 DIAGNOSIS — R509 Fever, unspecified: Secondary | ICD-10-CM | POA: Diagnosis not present

## 2021-04-25 DIAGNOSIS — R188 Other ascites: Secondary | ICD-10-CM | POA: Diagnosis not present

## 2021-04-25 DIAGNOSIS — B9562 Methicillin resistant Staphylococcus aureus infection as the cause of diseases classified elsewhere: Secondary | ICD-10-CM | POA: Diagnosis not present

## 2021-04-25 DIAGNOSIS — R1032 Left lower quadrant pain: Secondary | ICD-10-CM | POA: Diagnosis not present

## 2021-04-25 DIAGNOSIS — Z885 Allergy status to narcotic agent status: Secondary | ICD-10-CM | POA: Diagnosis not present

## 2021-04-25 DIAGNOSIS — R000 Tachycardia, unspecified: Secondary | ICD-10-CM | POA: Diagnosis not present

## 2021-04-25 DIAGNOSIS — G4733 Obstructive sleep apnea (adult) (pediatric): Secondary | ICD-10-CM | POA: Diagnosis not present

## 2021-04-25 DIAGNOSIS — A419 Sepsis, unspecified organism: Secondary | ICD-10-CM | POA: Diagnosis not present

## 2021-04-26 ENCOUNTER — Telehealth: Payer: Self-pay | Admitting: General Practice

## 2021-04-26 NOTE — Telephone Encounter (Signed)
Transition Care Management Unsuccessful Follow-up Telephone Call  Date of discharge and from where: 04/25/21 from [facility]  Attempts:  1st Attempt  Reason for unsuccessful TCM follow-up call:  Left voice message

## 2021-04-28 NOTE — Telephone Encounter (Signed)
Transition Care Management Unsuccessful Follow-up Telephone Call  Date of discharge and from where:  04/25/21 from [facility]  Attempts:  2nd Attempt  Reason for unsuccessful TCM follow-up call:  Left voice message

## 2021-05-02 NOTE — Telephone Encounter (Signed)
Transition Care Management Unsuccessful Follow-up Telephone Call  Date of discharge and from where:  04/25/21 from [facility]  Attempts:  3rd Attempt  Reason for unsuccessful TCM follow-up call:  Left voice message

## 2021-05-02 NOTE — Telephone Encounter (Signed)
error 

## 2021-05-04 DIAGNOSIS — R239 Unspecified skin changes: Secondary | ICD-10-CM | POA: Diagnosis not present

## 2021-05-04 DIAGNOSIS — Z8719 Personal history of other diseases of the digestive system: Secondary | ICD-10-CM | POA: Diagnosis not present

## 2021-05-04 DIAGNOSIS — G4733 Obstructive sleep apnea (adult) (pediatric): Secondary | ICD-10-CM | POA: Diagnosis not present

## 2021-05-05 ENCOUNTER — Other Ambulatory Visit: Payer: Self-pay

## 2021-05-05 ENCOUNTER — Encounter: Payer: Self-pay | Admitting: Family Medicine

## 2021-05-05 ENCOUNTER — Ambulatory Visit: Payer: BC Managed Care – PPO | Admitting: Family Medicine

## 2021-05-05 VITALS — BP 116/74 | HR 90 | Temp 97.3°F | Ht 65.0 in | Wt 208.0 lb

## 2021-05-05 DIAGNOSIS — F902 Attention-deficit hyperactivity disorder, combined type: Secondary | ICD-10-CM

## 2021-05-05 MED ORDER — LISDEXAMFETAMINE DIMESYLATE 70 MG PO CAPS: 70.0000 mg | ORAL_CAPSULE | Freq: Every day | ORAL | 0 refills | Status: DC

## 2021-05-05 NOTE — Progress Notes (Signed)
Established Patient Office Visit  Subjective:  Patient ID: Yolanda Mosley, female    DOB: Jul 14, 1980  Age: 41 y.o. MRN: [id number]  CC:  Chief Complaint  Patient presents with   Medication Refill    Vyvanse    HPI Yolanda Mosley presents for ADHD follow-up and med refill.   Patient usually takes Vyvanse 70 mg. She ran out a few weeks ago, but was hospitalized for a total of about 13-14 days and they were able to give it to her while she was there. Current dose is working well. No side effects. Normal appetite. No chest pain, palpitations/tachycardia, dyspnea, insomnia, etc.       Past Medical History:  Diagnosis Date   ADHD 03/01/2011   ALLERGIC RHINITIS 01/09/2010   Qualifier: Diagnosis of  By: Madilyn Fireman MD, Catherine     Allergy    Anemia    Anemia, iron deficiency 09/09/2013   Asthma    Bilateral low back pain without sciatica 08/10/2014   CA - cancer 2005   Germ Cell Tumor - Rt Ovary   DDD (degenerative disc disease), lumbar 08/11/2014   L5 and S1 via xrays 07/2014.     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  CPAP 7 cm water pressure.  Mild OSA AHI  8.7.    Ovarian cyst    left   Peripheral edema 10/22/2016   Post chemo evaluation 04/04/2015   Sleep apnea    Tachycardia    Venous stasis dermatitis of both lower extremities 10/23/2016    Past Surgical History:  Procedure Laterality Date   LEFT HEART CATH AND CORONARY ANGIOGRAPHY N/A 07/14/2020   Procedure: LEFT HEART CATH AND CORONARY ANGIOGRAPHY;  Surgeon: Troy Sine, MD;  Location: [facility];  Service: Cardiovascular;  Laterality: N/A;   OMENTECTOMY  2005   ovary, bilater fallopian tubes removed  2005   SALPINGOOPHORECTOMY Right     Family History  Problem Relation Age of Onset   Hypertension Mother    Diabetes Other    Other Other        cardiovascular disorder    Social History   Socioeconomic History   Marital status: Single    Spouse name: Not on file   Number of children: Not on file   Years of education: Not on file   Highest education level: Not on file  Occupational History   Not on file  Tobacco Use   Smoking status: Never   Smokeless tobacco: Never  Substance and Sexual Activity   Alcohol use: No   Drug use: No  Sexual activity: Yes    Birth control/protection: None  Other Topics Concern   Not on file  Social History Narrative   Not on file   Social Determinants of Health   Financial Resource Strain: Not on file  Food Insecurity: Not on file  Transportation Needs: Not on file  Physical Activity: Not on file  Stress: Not on file  Social Connections: Not on file  Intimate Partner Violence: Not on file    Outpatient Medications Prior to Visit  Medication Sig Dispense Refill   albuterol (VENTOLIN HFA) 108 (90 Base) MCG/ACT inhaler INHALE 1 TO 2 PUFFS BY MOUTH EVERY 4 HOURS AS NEEDED FOR WHEEZING OR SHORTNESS OF BREATH 9 g PRN   atenolol (TENORMIN) 50 MG tablet Take 1 tablet by mouth once daily 90 tablet 0   benazepril-hydrochlorthiazide (LOTENSIN HCT) 20-12.5 MG tablet Take 2 tablets by mouth daily. 180 tablet 1   cetirizine  (ZYRTEC) 10 MG tablet Take 10 mg by mouth daily.     doxazosin (CARDURA) 2 MG tablet Take 1 tablet by mouth once daily 30 tablet 1   fluconazole (DIFLUCAN) 150 MG tablet Take 150 mg by mouth every other day.     metroNIDAZOLE (FLAGYL) 500 MG tablet Take 500 mg by mouth 2 (two) times daily.     lisdexamfetamine (VYVANSE) 70 MG capsule Take 1 capsule (70 mg total) by mouth daily. 30 capsule 0   lisdexamfetamine (VYVANSE) 70 MG capsule Take 1 capsule (70 mg total) by mouth daily before breakfast. 30 capsule 0   lisdexamfetamine (VYVANSE) 70 MG capsule Take 1 capsule (70 mg total) by mouth daily. 30 capsule 0   diltiazem (CARDIZEM CD) 120 MG 24 hr capsule Take 1 capsule (120 mg total) by mouth daily. 90 capsule 3   No facility-administered medications prior to visit.    Allergies  Allergen Reactions   Ibuprofen     Severe bleeding   Other Rash   Hydrocodone Other (See Comments)    Dizzy , nauseous, swimmy headed.(Can take with phenergan)   Keflex [Cephalexin]     Diarrhea and more abdominal cramping    Codeine Nausea Only   Tape Rash    Plastic tape, tegaderm    Wound Dressing Adhesive Rash    Plastic tape, tegaderm     ROS Review of Systems All review of systems negative except what is listed in the HPI    Objective:    Physical Exam Vitals reviewed.  Constitutional:      Appearance: Normal appearance.  HENT:     Head: Normocephalic and atraumatic.  Cardiovascular:     Rate and Rhythm: Normal rate and regular rhythm.     Heart sounds: Normal heart sounds.  Pulmonary:     Effort: Pulmonary effort is normal.     Breath sounds: Normal breath sounds.  Neurological:     General: No focal deficit present.     Mental Status: She is alert and oriented to person, place, and time.  Psychiatric:        Mood and Affect: Mood normal.        Behavior: Behavior normal.        Thought Content: Thought content normal.        Judgment: Judgment normal.    BP 116/74 (BP Location:  Left Arm, Patient Position: Sitting, Cuff Size: Large)    Pulse 90    Temp (!) 97.3 F (36.3 C) (Oral)    Ht 5' 5" (1.651 m)  Wt 208 lb 0.6 oz (94.4 kg)    SpO2 98%    BMI 34.62 kg/m  Wt Readings from Last 3 Encounters:  05/05/21 208 lb 0.6 oz (94.4 kg)  01/16/21 223 lb (101.2 kg)  10/26/20 230 lb (104.3 kg)     Health Maintenance Due  Topic Date Due   Hepatitis C Screening  Never done    There are no preventive care reminders to display for this patient.  Lab Results  Component Value Date   TSH 5.01 (H) 06/13/2020   Lab Results  Component Value Date   WBC 5.8 07/08/2020   HGB 13.9 07/08/2020   HCT 41.3 07/08/2020   MCV 87 07/08/2020   PLT 216 07/08/2020   Lab Results  Component Value Date   NA 140 07/08/2020   K 4.1 07/08/2020   CO2 23 07/08/2020   GLUCOSE 95 07/08/2020   BUN 15 07/08/2020   CREATININE 0.65 07/08/2020   BILITOT 0.4 03/28/2020   ALKPHOS 109 11/04/2015   AST 17 03/28/2020   ALT 22 03/28/2020   PROT 7.5 03/28/2020   ALBUMIN 4.0 11/04/2015   CALCIUM 9.7 07/08/2020   EGFR 115 07/08/2020   Lab Results  Component Value Date   CHOL 156 03/28/2020   Lab Results  Component Value Date   HDL 50 03/28/2020   Lab Results  Component Value Date   LDLCALC 86 03/28/2020   Lab Results  Component Value Date   TRIG 100 03/28/2020   Lab Results  Component Value Date   CHOLHDL 3.1 03/28/2020   No results found for: HGBA1C    Assessment & Plan:    1. Attention deficit hyperactivity disorder (ADHD), combined type PDMP reviewed. Continue current regimen. Scripts sent in for the next 3 months. Patient aware of signs/symptoms requiring further/urgent evaluation.   - lisdexamfetamine (VYVANSE) 70 MG capsule; Take 1 capsule (70 mg total) by mouth daily.  Dispense: 30 capsule; Refill: 0 - lisdexamfetamine (VYVANSE) 70 MG capsule; Take 1 capsule (70 mg total) by mouth daily before breakfast.  Dispense: 30 capsule; Refill: 0 - lisdexamfetamine (VYVANSE)  70 MG capsule; Take 1 capsule (70 mg total) by mouth daily.  Dispense: 30 capsule; Refill: 0    Follow-up: Return in about 3 months (around 08/03/2021) for ADHD f/u and refills .    Terrilyn Saver, NP

## 2021-05-09 DIAGNOSIS — B359 Dermatophytosis, unspecified: Secondary | ICD-10-CM | POA: Diagnosis not present

## 2021-05-09 DIAGNOSIS — N760 Acute vaginitis: Secondary | ICD-10-CM | POA: Diagnosis not present

## 2021-05-09 DIAGNOSIS — Z8742 Personal history of other diseases of the female genital tract: Secondary | ICD-10-CM | POA: Diagnosis not present

## 2021-05-09 DIAGNOSIS — L2089 Other atopic dermatitis: Secondary | ICD-10-CM | POA: Diagnosis not present

## 2021-05-09 DIAGNOSIS — Z48816 Encounter for surgical aftercare following surgery on the genitourinary system: Secondary | ICD-10-CM | POA: Diagnosis not present

## 2021-05-09 DIAGNOSIS — B9689 Other specified bacterial agents as the cause of diseases classified elsewhere: Secondary | ICD-10-CM | POA: Diagnosis not present

## 2021-05-09 DIAGNOSIS — Z8619 Personal history of other infectious and parasitic diseases: Secondary | ICD-10-CM | POA: Diagnosis not present

## 2021-05-12 DIAGNOSIS — Z4803 Encounter for change or removal of drains: Secondary | ICD-10-CM | POA: Diagnosis not present

## 2021-05-12 DIAGNOSIS — L0291 Cutaneous abscess, unspecified: Secondary | ICD-10-CM | POA: Diagnosis not present

## 2021-05-12 DIAGNOSIS — L02818 Cutaneous abscess of other sites: Secondary | ICD-10-CM | POA: Diagnosis not present

## 2021-05-17 DIAGNOSIS — Z6835 Body mass index (BMI) 35.0-35.9, adult: Secondary | ICD-10-CM | POA: Diagnosis not present

## 2021-05-17 DIAGNOSIS — I10 Essential (primary) hypertension: Secondary | ICD-10-CM | POA: Diagnosis not present

## 2021-05-17 DIAGNOSIS — G4733 Obstructive sleep apnea (adult) (pediatric): Secondary | ICD-10-CM | POA: Diagnosis not present

## 2021-05-17 DIAGNOSIS — E669 Obesity, unspecified: Secondary | ICD-10-CM | POA: Diagnosis not present

## 2021-05-19 DIAGNOSIS — N83202 Unspecified ovarian cyst, left side: Secondary | ICD-10-CM | POA: Diagnosis not present

## 2021-05-22 ENCOUNTER — Other Ambulatory Visit: Payer: Self-pay | Admitting: Family Medicine

## 2021-05-30 DIAGNOSIS — G4733 Obstructive sleep apnea (adult) (pediatric): Secondary | ICD-10-CM | POA: Diagnosis not present

## 2021-05-31 DIAGNOSIS — Z8543 Personal history of malignant neoplasm of ovary: Secondary | ICD-10-CM | POA: Diagnosis not present

## 2021-05-31 DIAGNOSIS — C561 Malignant neoplasm of right ovary: Secondary | ICD-10-CM | POA: Diagnosis not present

## 2021-05-31 DIAGNOSIS — R102 Pelvic and perineal pain: Secondary | ICD-10-CM | POA: Diagnosis not present

## 2021-06-12 DIAGNOSIS — G4733 Obstructive sleep apnea (adult) (pediatric): Secondary | ICD-10-CM | POA: Diagnosis not present

## 2021-07-03 DIAGNOSIS — G4710 Hypersomnia, unspecified: Secondary | ICD-10-CM | POA: Diagnosis not present

## 2021-07-03 DIAGNOSIS — G4733 Obstructive sleep apnea (adult) (pediatric): Secondary | ICD-10-CM | POA: Diagnosis not present

## 2021-07-04 ENCOUNTER — Telehealth: Payer: Self-pay | Admitting: Family Medicine

## 2021-07-04 NOTE — Telephone Encounter (Signed)
Pt said she went to the pharmacy to pick up her prescription for Vyvanse and they told her she needs to have the prescription pre-authorized. She says she only has 3 pills left.  ?

## 2021-07-05 ENCOUNTER — Encounter: Payer: Self-pay | Admitting: Family Medicine

## 2021-07-05 ENCOUNTER — Telehealth: Payer: Self-pay

## 2021-07-05 NOTE — Telephone Encounter (Addendum)
Initiated Prior authorization [id number] '70MG'$  capsules ?Via: [employer] ?Case/Key:N/a ?Status: N/A as of 07/05/21 ?Reason:pt does not have active pharmacy benefit, coverage ended 04/19/2021 however her medical insurance is still active she has to renew her coverage before we able  to submit a pa for her medications. Media scan of pt insurance was 06/27/20. ?Notified Pt via: Called pt to inform her, she is aware also sent my chart message.  ?

## 2021-07-05 NOTE — Telephone Encounter (Signed)
Pt called and stated that she called and was told to have [place] call [phone] for insurance verification. She stated that she does have insurance.  ?

## 2021-07-06 ENCOUNTER — Telehealth: Payer: Self-pay

## 2021-07-06 NOTE — Telephone Encounter (Addendum)
Resubmission Prior authorization [id number] '70MG'$  capsules ?Via: Covermymeds ?Case/Key:[id number]'6MG'$ N ?Status: approved as of 07/06/21 ?Reason: approved through 07/07/2022 ?Notified Pt via: Mychart ?

## 2021-07-11 ENCOUNTER — Other Ambulatory Visit: Payer: Self-pay | Admitting: Cardiology

## 2021-08-03 ENCOUNTER — Ambulatory Visit: Payer: BC Managed Care – PPO | Admitting: Family Medicine

## 2021-08-03 DIAGNOSIS — G4733 Obstructive sleep apnea (adult) (pediatric): Secondary | ICD-10-CM | POA: Diagnosis not present

## 2021-08-03 DIAGNOSIS — G4710 Hypersomnia, unspecified: Secondary | ICD-10-CM | POA: Diagnosis not present

## 2021-08-08 ENCOUNTER — Encounter: Payer: Self-pay | Admitting: Family Medicine

## 2021-08-08 ENCOUNTER — Ambulatory Visit: Payer: BC Managed Care – PPO | Admitting: Family Medicine

## 2021-08-08 VITALS — BP 108/72 | HR 77 | Resp 16 | Ht 65.0 in | Wt 226.0 lb

## 2021-08-08 DIAGNOSIS — F902 Attention-deficit hyperactivity disorder, combined type: Secondary | ICD-10-CM

## 2021-08-08 DIAGNOSIS — I10 Essential (primary) hypertension: Secondary | ICD-10-CM

## 2021-08-08 DIAGNOSIS — N760 Acute vaginitis: Secondary | ICD-10-CM

## 2021-08-08 DIAGNOSIS — G4700 Insomnia, unspecified: Secondary | ICD-10-CM

## 2021-08-08 DIAGNOSIS — G4733 Obstructive sleep apnea (adult) (pediatric): Secondary | ICD-10-CM | POA: Diagnosis not present

## 2021-08-08 LAB — WET PREP FOR TRICH, YEAST, CLUE
MICRO NUMBER:: 13247702
Specimen Quality: ADEQUATE

## 2021-08-08 MED ORDER — LISDEXAMFETAMINE DIMESYLATE 70 MG PO CAPS: 70.0000 mg | ORAL_CAPSULE | Freq: Every day | ORAL | 0 refills | Status: DC

## 2021-08-08 MED ORDER — FLUCONAZOLE 150 MG PO TABS: 150.0000 mg | ORAL_TABLET | Freq: Every day | ORAL | 1 refills | Status: DC

## 2021-08-08 NOTE — Assessment & Plan Note (Signed)
Benadryl makes her hyper.  Melatonin ineffective.  Commend a trial of Unisom, doxylamine.  If not effective could consider trial of prescription medication.  I am glad that she is treating her sleep apnea. ?

## 2021-08-08 NOTE — Progress Notes (Signed)
Carmelle, the vaginal swab was negative for any yeast or bacteria.  Let us know if your symptoms change or worsen.  Make sure you are hydrating well on your current antibiotic.

## 2021-08-08 NOTE — Assessment & Plan Note (Signed)
Well controlled. Continue current regimen. Follow up in  6 mo  

## 2021-08-08 NOTE — Assessment & Plan Note (Signed)
Well controlled. Continue current regimen. Follow up in  3-4 months.  

## 2021-08-08 NOTE — Progress Notes (Signed)
? ?Established Patient Office Visit ? ?Subjective:  ?Patient ID: Yolanda Mosley, female    DOB: [birth year]  Age: 41 y.o. MRN: [id number] ? ?CC:  ?Chief Complaint  ?Patient presents with  ? ADHD  ?  Follow up   ? Vaginal itching  ?  1 day. Patient is currently taking Amoxicillin for infected tooth.   ? ? ?HPI ?[name] presents for  ? ?ADHD - Reports symptoms are well controlled on current regime. Denies any problems with insomnia, chest pain, palpitations, or SOB.   ? ?Reports vaginal itching x 1 day. Patient is currently taking Amoxicillin for infected tooth.  She still having pain particularly in the left ear and left side of her neck.  She has had a little bit of postnasal drip.  She thinks it could be coming from the tooth but she is not sure. ? ?Still struggling with sleep.  For years she is had difficulty with sleep maintenance and waking up frequently overnight.  Now that she is wearing her CPAP for the last month she still waking up frequently she was hoping that that would help.  She has a follow-up coming up with a sleep specialist soon.  She has tried Benadryl in the past but it makes her hyper and she is tried melatonin in the past but it does not seem to be effective. ? ?Past Medical History:  ?Diagnosis Date  ? ADHD 03/01/2011  ? ALLERGIC RHINITIS 01/09/2010  ? Qualifier: Diagnosis of  By: Madilyn Fireman MD, Barnetta Chapel    ? Allergy   ? Anemia   ? Anemia, iron deficiency 09/09/2013  ? Asthma   ? Bilateral low back pain without sciatica 08/10/2014  ? CA - cancer 2005  ? Germ Cell Tumor - Rt Ovary  ? DDD (degenerative disc disease), lumbar 08/11/2014  ? L5 and S1 via xrays 07/2014.    ? 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 EXTERNAL HEMORRHOIDS 01/09/2010  ? Qualifier: Diagnosis of  By: Madilyn Fireman MD, Barnetta Chapel    ? Gastric ulcer 09/14/2013  ? Seen on EGD 08/2013 with [facility]   ? Gastroesophageal reflux disease with esophagitis 08/16/2016  ? GERD (gastroesophageal reflux disease)   ? Hemorrhoids    ? laser tx   ? Herpes simplex virus (HSV) infection   ? History of ovarian cancer 01/20/2014  ? stage IIc dysgerminoma diagnosed in 2005  ? History of tachycardia 10/22/2016  ? 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 Hypertension   ? HYPERTENSION, MILD 01/09/2010  ? Qualifier: Diagnosis of  By: Madilyn Fireman MD, Barnetta Chapel    ? Mid back pain 08/10/2014  ? Mild mitral regurgitation by prior echocardiogram 11/14/2016  ? Mild persistent asthma without complication 6/64/4034  ? Muscle spasm of back 08/10/2014  ? Obesity   ? OSA on CPAP 01/08/2011  ? Sleep study 01/24/2006.  CPAP 7 cm water pressure.  Mild OSA AHI 8.7.   ? Ovarian cyst   ? left  ? Peripheral edema 10/22/2016  ? Post chemo evaluation 04/04/2015  ? Sleep apnea   ? Tachycardia   ? Venous stasis dermatitis of both lower extremities 10/23/2016  ? ? ?Past Surgical History:  ?Procedure Laterality Date  ? LEFT HEART CATH AND CORONARY ANGIOGRAPHY N/A 07/14/2020  ? Procedure: LEFT HEART CATH AND CORONARY ANGIOGRAPHY;  Surgeon: Troy Sine, MD;  Location: [facility];  Service: Cardiovascular;  Laterality: N/A;  ? OMENTECTOMY  2005  ? ovary, bilater fallopian tubes removed  2005  ? SALPINGOOPHORECTOMY  Right   ? ? ?Family History  ?Problem Relation Age of Onset  ? Hypertension Mother   ? Diabetes Other   ? Other Other   ?     cardiovascular disorder  ? ? ?Social History  ? ?Socioeconomic History  ? Marital status: Single  ?  Spouse name: Not on file  ? Number of children: Not on file  ? Years of education: Not on file  ? Highest education level: Not on file  ?Occupational History  ? Not on file  ?Tobacco Use  ? Smoking status: Never  ? Smokeless tobacco: Never  ?Substance and Sexual Activity  ? Alcohol use: No  ? Drug use: No  ? Sexual activity: Yes  ?  Birth control/protection: None  ?Other Topics Concern  ? Not on file  ?Social History Narrative  ? Not on file  ? ?Social Determinants of Health  ? ?Financial Resource Strain: Not on file  ?Food Insecurity: Not on file   ?Transportation Needs: Not on file  ?Physical Activity: Not on file  ?Stress: Not on file  ?Social Connections: Not on file  ?Intimate Partner Violence: Not on file  ? ? ?Outpatient Medications Prior to Visit  ?Medication Sig Dispense Refill  ? albuterol (VENTOLIN HFA) 108 (90 Base) MCG/ACT inhaler INHALE 1 TO 2 PUFFS BY MOUTH EVERY 4 HOURS AS NEEDED FOR WHEEZING OR SHORTNESS OF BREATH 9 g PRN  ? atenolol (TENORMIN) 50 MG tablet Take 1 tablet by mouth once daily (Patient taking differently: Take 25 mg by mouth daily.) 90 tablet 0  ? benazepril-hydrochlorthiazide (LOTENSIN HCT) 20-12.5 MG tablet Take 2 tablets by mouth once daily 180 tablet 0  ? cetirizine (ZYRTEC) 10 MG tablet Take 10 mg by mouth daily.    ? diltiazem (CARDIZEM CD) 120 MG 24 hr capsule Take 1 capsule by mouth once daily 30 capsule 0  ? doxazosin (CARDURA) 2 MG tablet Take 1 tablet by mouth once daily 30 tablet 1  ? diltiazem (TIAZAC) 120 MG 24 hr capsule Take 1 capsule by mouth daily.    ? lisdexamfetamine (VYVANSE) 70 MG capsule Take 1 capsule (70 mg total) by mouth daily. 30 capsule 0  ? lisdexamfetamine (VYVANSE) 70 MG capsule Take 1 capsule (70 mg total) by mouth daily before breakfast. 30 capsule 0  ? lisdexamfetamine (VYVANSE) 70 MG capsule Take 1 capsule (70 mg total) by mouth daily. 30 capsule 0  ? amoxicillin (AMOXIL) 500 MG capsule Take 500 mg by mouth every 8 (eight) hours.    ? fluconazole (DIFLUCAN) 150 MG tablet Take 150 mg by mouth every other day.    ? metroNIDAZOLE (FLAGYL) 500 MG tablet Take 500 mg by mouth 2 (two) times daily.    ? ?No facility-administered medications prior to visit.  ? ? ?Allergies  ?Allergen Reactions  ? Ibuprofen   ?  Severe bleeding  ? Other Rash  ? Hydrocodone Other (See Comments)  ?  Dizzy , nauseous, swimmy headed.(Can take with phenergan)  ? Keflex [Cephalexin]   ?  Diarrhea and more abdominal cramping ?  ? Codeine Nausea Only  ? Tape Rash  ?  Plastic tape, tegaderm   ? Wound Dressing Adhesive Rash  ?   Plastic tape, tegaderm   ? ? ?ROS ?Review of Systems ? ?  ?Objective:  ?  ?Physical Exam ?Constitutional:   ?   Appearance: Normal appearance. She is well-developed.  ?HENT:  ?   Head: Normocephalic and atraumatic.  ?   Right Ear:  Tympanic membrane, ear canal and external ear normal.  ?   Left Ear: Tympanic membrane, ear canal and external ear normal.  ?   Nose: Nose normal.  ?   Mouth/Throat:  ?   Mouth: Mucous membranes are moist.  ?   Pharynx: Oropharynx is clear. No oropharyngeal exudate or posterior oropharyngeal erythema.  ?Eyes:  ?   Conjunctiva/sclera: Conjunctivae normal.  ?   Pupils: Pupils are equal, round, and reactive to light.  ?Neck:  ?   Thyroid: No thyromegaly.  ?   Comments: Mild tenderness left anterior neck. ?Cardiovascular:  ?   Rate and Rhythm: Normal rate and regular rhythm.  ?   Heart sounds: Normal heart sounds.  ?Pulmonary:  ?   Effort: Pulmonary effort is normal.  ?   Breath sounds: Normal breath sounds. No wheezing.  ?Musculoskeletal:  ?   Cervical back: Neck supple. Tenderness present.  ?Lymphadenopathy:  ?   Cervical: No cervical adenopathy.  ?Skin: ?   General: Skin is warm and dry.  ?Neurological:  ?   Mental Status: She is alert and oriented to person, place, and time.  ?Psychiatric:     ?   Behavior: Behavior normal.  ? ? ?BP 108/72   Pulse 77   Resp 16   Ht '5\' 5"'$  (1.651 m)   Wt 226 lb (102.5 kg)   LMP 08/02/2021   SpO2 98%   BMI 37.61 kg/m?  ?Wt Readings from Last 3 Encounters:  ?08/08/21 226 lb (102.5 kg)  ?05/05/21 208 lb 0.6 oz (94.4 kg)  ?01/16/21 223 lb (101.2 kg)  ? ? ? ?Health Maintenance Due  ?Topic Date Due  ? Hepatitis C Screening  Never done  ? ? ?There are no preventive care reminders to display for this patient. ? ?Lab Results  ?Component Value Date  ? TSH 5.01 (H) 06/13/2020  ? ?Lab Results  ?Component Value Date  ? WBC 5.8 07/08/2020  ? HGB 13.9 07/08/2020  ? HCT 41.3 07/08/2020  ? MCV 87 07/08/2020  ? PLT 216 07/08/2020  ? ?Lab Results  ?Component Value Date   ? NA 140 07/08/2020  ? K 4.1 07/08/2020  ? CO2 23 07/08/2020  ? GLUCOSE 95 07/08/2020  ? BUN 15 07/08/2020  ? CREATININE 0.65 07/08/2020  ? BILITOT 0.4 03/28/2020  ? ALKPHOS 109 11/04/2015  ? AST 17 03/28/2020  ? ALT

## 2021-08-13 ENCOUNTER — Other Ambulatory Visit: Payer: Self-pay | Admitting: Cardiology

## 2021-08-14 NOTE — Telephone Encounter (Signed)
Diltiazem 120 mg # 30 only with message to patient and pharmacy to contact patients pcp for future refills. ?Patient to follow up with [place] as needed ?

## 2021-08-22 ENCOUNTER — Other Ambulatory Visit: Payer: Self-pay | Admitting: Family Medicine

## 2021-09-02 DIAGNOSIS — G4710 Hypersomnia, unspecified: Secondary | ICD-10-CM | POA: Diagnosis not present

## 2021-09-02 DIAGNOSIS — G4733 Obstructive sleep apnea (adult) (pediatric): Secondary | ICD-10-CM | POA: Diagnosis not present

## 2021-09-13 ENCOUNTER — Other Ambulatory Visit: Payer: Self-pay | Admitting: Cardiology

## 2021-09-15 ENCOUNTER — Other Ambulatory Visit: Payer: Self-pay | Admitting: Cardiology

## 2021-09-15 ENCOUNTER — Telehealth: Payer: Self-pay | Admitting: Family Medicine

## 2021-09-15 NOTE — Telephone Encounter (Signed)
Patient called to request a refill for her bp meds Dilitiazem. Verified pharmacy info was correct.

## 2021-09-16 ENCOUNTER — Other Ambulatory Visit: Payer: Self-pay | Admitting: Cardiology

## 2021-09-18 NOTE — Telephone Encounter (Signed)
Meds were sent.

## 2021-10-03 DIAGNOSIS — H109 Unspecified conjunctivitis: Secondary | ICD-10-CM | POA: Diagnosis not present

## 2021-10-03 DIAGNOSIS — G4710 Hypersomnia, unspecified: Secondary | ICD-10-CM | POA: Diagnosis not present

## 2021-10-03 DIAGNOSIS — J3489 Other specified disorders of nose and nasal sinuses: Secondary | ICD-10-CM | POA: Diagnosis not present

## 2021-10-03 DIAGNOSIS — G4733 Obstructive sleep apnea (adult) (pediatric): Secondary | ICD-10-CM | POA: Diagnosis not present

## 2021-10-03 DIAGNOSIS — J069 Acute upper respiratory infection, unspecified: Secondary | ICD-10-CM | POA: Diagnosis not present

## 2021-10-06 ENCOUNTER — Telehealth: Payer: Self-pay | Admitting: Family Medicine

## 2021-10-06 DIAGNOSIS — F902 Attention-deficit hyperactivity disorder, combined type: Secondary | ICD-10-CM

## 2021-10-06 MED ORDER — LISDEXAMFETAMINE DIMESYLATE 70 MG PO CAPS: 70.0000 mg | ORAL_CAPSULE | Freq: Every day | ORAL | 0 refills | Status: DC

## 2021-10-06 NOTE — Telephone Encounter (Signed)
Pt called.  She need a refill on her vyvanse.  She has an appointment on July 3.

## 2021-10-06 NOTE — Telephone Encounter (Signed)
Meds ordered this encounter  Medications   lisdexamfetamine (VYVANSE) 70 MG capsule    Sig: Take 1 capsule (70 mg total) by mouth daily.    Dispense:  30 capsule    Refill:  0    

## 2021-10-06 NOTE — Telephone Encounter (Signed)
Pt informed of RX.  T. Denton Derks, CMA 

## 2021-10-30 ENCOUNTER — Ambulatory Visit: Payer: BC Managed Care – PPO | Admitting: Family Medicine

## 2021-11-01 ENCOUNTER — Other Ambulatory Visit: Payer: Self-pay | Admitting: Family Medicine

## 2021-11-02 DIAGNOSIS — G4733 Obstructive sleep apnea (adult) (pediatric): Secondary | ICD-10-CM | POA: Diagnosis not present

## 2021-11-02 DIAGNOSIS — G4710 Hypersomnia, unspecified: Secondary | ICD-10-CM | POA: Diagnosis not present

## 2021-11-06 ENCOUNTER — Ambulatory Visit: Payer: BC Managed Care – PPO | Admitting: Family Medicine

## 2021-11-06 ENCOUNTER — Encounter: Payer: Self-pay | Admitting: Family Medicine

## 2021-11-06 VITALS — BP 113/70 | HR 75 | Ht 64.0 in | Wt 223.0 lb

## 2021-11-06 DIAGNOSIS — H9202 Otalgia, left ear: Secondary | ICD-10-CM

## 2021-11-06 DIAGNOSIS — I10 Essential (primary) hypertension: Secondary | ICD-10-CM | POA: Diagnosis not present

## 2021-11-06 DIAGNOSIS — F902 Attention-deficit hyperactivity disorder, combined type: Secondary | ICD-10-CM

## 2021-11-06 MED ORDER — LISDEXAMFETAMINE DIMESYLATE 70 MG PO CAPS: 70.0000 mg | ORAL_CAPSULE | Freq: Every day | ORAL | 0 refills | Status: DC

## 2021-11-06 NOTE — Progress Notes (Signed)
Established Patient Office Visit  Subjective   Patient ID: Yolanda Mosley, female    DOB: [birth year]  Age: 41 y.o. MRN: [id number]  Chief Complaint  Patient presents with   ADHD    HPI ADHD - Reports symptoms are well controlled on current regime. Denies any problems with insomnia, chest pain, palpitations, or SOB.    Hypertension- Pt denies chest pain, SOB, dizziness, or heart palpitations.  Taking meds as directed w/o problems.  Denies medication side effects.    Also complains of left ear pain radiating down to the neck.  No sore throat.  She has had a slight cough.  Just got back from vacation.  No fever.  No ear drainage. No sick contacts.       ROS    Objective:     BP 113/70   Pulse 75   Ht '5\' 4"'$  (1.626 m)   Wt 223 lb (101.2 kg)   SpO2 97%   BMI 38.28 kg/m    Physical Exam Vitals and nursing note reviewed.  Constitutional:      Appearance: She is well-developed.  HENT:     Head: Normocephalic and atraumatic.     Right Ear: External ear normal.     Left Ear: External ear normal.     Nose: Nose normal.  Eyes:     Conjunctiva/sclera: Conjunctivae normal.     Pupils: Pupils are equal, round, and reactive to light.  Neck:     Thyroid: No thyromegaly.  Cardiovascular:     Rate and Rhythm: Normal rate and regular rhythm.     Heart sounds: Normal heart sounds.  Pulmonary:     Effort: Pulmonary effort is normal.     Breath sounds: Normal breath sounds. No wheezing.  Musculoskeletal:     Cervical back: Neck supple.  Lymphadenopathy:     Cervical: No cervical adenopathy.  Skin:    General: Skin is warm and dry.  Neurological:     Mental Status: She is alert and oriented to person, place, and time.  Psychiatric:        Behavior: Behavior normal.      No results found for any visits on 11/06/21.    The ASCVD Risk score (Arnett DK, et al., 2019) failed to calculate for the following reasons:   The 2019 ASCVD risk score is only valid for ages 75 to  82    Assessment & Plan:   Problem List Items Addressed This Visit       Cardiovascular and Mediastinum   Essential hypertension - Primary    Well controlled. Continue current regimen. Follow up in  6 months.         Other   ADHD    ADHD - Reports symptoms are well controlled on current regime. Denies any problems with insomnia, chest pain, palpitations, or SOB.         Relevant Medications   lisdexamfetamine (VYVANSE) 70 MG capsule (Start on 01/04/2022)   lisdexamfetamine (VYVANSE) 70 MG capsule (Start on 12/06/2021)   lisdexamfetamine (VYVANSE) 70 MG capsule   Other Visit Diagnoses     Left ear pain          Left ear pain -exam is normal and reassuring.  Sounds like she may have the beginning of an upper respiratory infection but if the left ear pain persist or worsens then please let us know.  She also has a dental carry on that side and does have a follow-up soon with her dentist.  No follow-ups on file.    Beatrice Lecher, MD

## 2021-11-06 NOTE — Assessment & Plan Note (Signed)
Well controlled. Continue current regimen. Follow up in  6 months.  

## 2021-11-06 NOTE — Assessment & Plan Note (Signed)
ADHD - Reports symptoms are well controlled on current regime. Denies any problems with insomnia, chest pain, palpitations, or SOB.

## 2021-11-07 LAB — CBC
HCT: 40.6 % (ref 35.0–45.0)
Hemoglobin: 13.8 g/dL (ref 11.7–15.5)
MCH: 28.7 pg (ref 27.0–33.0)
MCHC: 34 g/dL (ref 32.0–36.0)
MCV: 84.4 fL (ref 80.0–100.0)
MPV: 11.4 fL (ref 7.5–12.5)
Platelets: 247 10*3/uL (ref 140–400)
RBC: 4.81 10*6/uL (ref 3.80–5.10)
RDW: 13.8 % (ref 11.0–15.0)
WBC: 8.5 10*3/uL (ref 3.8–10.8)

## 2021-11-07 LAB — LIPID PANEL W/REFLEX DIRECT LDL
Cholesterol: 178 mg/dL (ref <200)
HDL: 47 mg/dL — ABNORMAL LOW (ref >=50)
LDL Cholesterol (Calc): 96 mg/dL (calc)
Non-HDL Cholesterol (Calc): 131 mg/dL (calc) — ABNORMAL HIGH (ref <130)
Total CHOL/HDL Ratio: 3.8 (calc) (ref <5.0)
Triglycerides: 234 mg/dL — ABNORMAL HIGH (ref <150)

## 2021-11-07 LAB — COMPLETE METABOLIC PANEL WITH GFR
AG Ratio: 1.4 (calc) (ref 1.0–2.5)
ALT: 18 U/L (ref 6–29)
AST: 18 U/L (ref 10–30)
Albumin: 4 g/dL (ref 3.6–5.1)
Alkaline phosphatase (APISO): 118 U/L (ref 31–125)
BUN: 13 mg/dL (ref 7–25)
CO2: 30 mmol/L (ref 20–32)
Calcium: 9.6 mg/dL (ref 8.6–10.2)
Chloride: 99 mmol/L (ref 98–110)
Creat: 0.73 mg/dL (ref 0.50–0.97)
Globulin: 2.8 g/dL (calc) (ref 1.9–3.7)
Glucose, Bld: 124 mg/dL — ABNORMAL HIGH (ref 65–99)
Potassium: 3.1 mmol/L — ABNORMAL LOW (ref 3.5–5.3)
Sodium: 138 mmol/L (ref 135–146)
Total Bilirubin: 0.4 mg/dL (ref 0.2–1.2)
Total Protein: 6.8 g/dL (ref 6.1–8.1)
eGFR: 107 mL/min/{1.73_m2} (ref >=60)

## 2021-11-07 NOTE — Progress Notes (Signed)
Hi Ernesteen, potassium was little borderline low again.  Not exactly sure why notes happen before.  Just encourage you to eat potassium rich foods and also plan to recheck in 1 to 2 weeks to make sure that it is back to normal.  LDL looks great total cholesterol looks great.  Triglycerides jumped up.  Normally they are around 100 this time they were 234.  Was encouraged to continue to work on healthy food choices and regular exercise.  Blood count is normal.  Please let us know when your last Pap smear was so that we can get your chart updated.

## 2021-11-11 ENCOUNTER — Other Ambulatory Visit: Payer: Self-pay | Admitting: Family Medicine

## 2021-11-16 DIAGNOSIS — G4733 Obstructive sleep apnea (adult) (pediatric): Secondary | ICD-10-CM | POA: Diagnosis not present

## 2021-11-30 ENCOUNTER — Other Ambulatory Visit: Payer: Self-pay

## 2021-12-03 DIAGNOSIS — G4733 Obstructive sleep apnea (adult) (pediatric): Secondary | ICD-10-CM | POA: Diagnosis not present

## 2021-12-03 DIAGNOSIS — G4710 Hypersomnia, unspecified: Secondary | ICD-10-CM | POA: Diagnosis not present

## 2021-12-04 ENCOUNTER — Telehealth: Payer: Self-pay | Admitting: Family Medicine

## 2021-12-04 DIAGNOSIS — F902 Attention-deficit hyperactivity disorder, combined type: Secondary | ICD-10-CM

## 2021-12-04 NOTE — Telephone Encounter (Signed)
Pt called. Pharmacy on file does not have her Vyvanse. Please send script to [facility] on [address].

## 2021-12-05 ENCOUNTER — Telehealth: Payer: Self-pay | Admitting: Family Medicine

## 2021-12-05 MED ORDER — LISDEXAMFETAMINE DIMESYLATE 70 MG PO CAPS: 70.0000 mg | ORAL_CAPSULE | Freq: Every day | ORAL | 0 refills | Status: DC

## 2021-12-05 NOTE — Telephone Encounter (Signed)
Pt called stating she left a message yesterday that her pharmacy is out of her Vyvanse('70mg'$ ) and would like it called in to the [facility] on [address]

## 2021-12-05 NOTE — Telephone Encounter (Signed)
Previous rx sent to [facility] on [address] cancelled. Spoke to pharmacist directly who confirmed that current dose is not in stock at the pharmacy.

## 2021-12-05 NOTE — Telephone Encounter (Signed)
Meds ordered this encounter  Medications   lisdexamfetamine (VYVANSE) 70 MG capsule    Sig: Take 1 capsule (70 mg total) by mouth daily before breakfast.    Dispense:  30 capsule    Refill:  0

## 2021-12-06 ENCOUNTER — Other Ambulatory Visit: Payer: Self-pay

## 2021-12-06 DIAGNOSIS — F902 Attention-deficit hyperactivity disorder, combined type: Secondary | ICD-10-CM

## 2021-12-06 MED ORDER — LISDEXAMFETAMINE DIMESYLATE 70 MG PO CAPS: 70.0000 mg | ORAL_CAPSULE | Freq: Every day | ORAL | 0 refills | Status: DC

## 2021-12-06 NOTE — Telephone Encounter (Signed)
Task completed. Left a detailed vm msg for patient that refill sent to preferred pharmacy. Direct call back info provided.

## 2021-12-06 NOTE — Telephone Encounter (Signed)
[facility] in [place] is out of Vyvanse. She needs the prescription sent to [facility] on [address].   Pended prescription and pharmacy.

## 2021-12-06 NOTE — Telephone Encounter (Signed)
This was sent yesterday. However the fill date is not until next month.

## 2021-12-07 ENCOUNTER — Other Ambulatory Visit: Payer: Self-pay | Admitting: Family Medicine

## 2021-12-08 NOTE — Telephone Encounter (Signed)
Erroneous error

## 2021-12-26 ENCOUNTER — Other Ambulatory Visit: Payer: Self-pay | Admitting: Family Medicine

## 2022-01-02 ENCOUNTER — Telehealth: Payer: Self-pay

## 2022-01-02 NOTE — Telephone Encounter (Signed)
Yolanda Mosley called and left a message stating the Vyvanse 70 mg is out of stock everywhere. She would like a different medication.

## 2022-01-03 NOTE — Telephone Encounter (Signed)
Left message advising of recommendations.  

## 2022-01-03 NOTE — Telephone Encounter (Signed)
She will need to call and see what is available.  They may have 2 different prescriptions such as a 30 and 40 that we might be able to put together or 20s that we might be able to get close.  Or if she wants to switch to Adderall we can do that but it is also on backorder so again she may want to speak with her pharmacist to see what is actually available and then we can send a prescription.

## 2022-01-04 MED ORDER — LISDEXAMFETAMINE DIMESYLATE 50 MG PO CAPS: 50.0000 mg | ORAL_CAPSULE | Freq: Every day | ORAL | 0 refills | Status: DC

## 2022-01-04 MED ORDER — LISDEXAMFETAMINE DIMESYLATE 20 MG PO CAPS: 20.0000 mg | ORAL_CAPSULE | Freq: Every day | ORAL | 0 refills | Status: DC

## 2022-01-04 NOTE — Telephone Encounter (Signed)
Riannon called back and states the pharmacy has both the 50's and the 20's.

## 2022-01-04 NOTE — Telephone Encounter (Signed)
RX pended for Vyvanse 50 and 20 to equal 70. Please sign if appropriate.

## 2022-01-04 NOTE — Telephone Encounter (Signed)
Meds ordered this encounter  Medications   lisdexamfetamine (VYVANSE) 20 MG capsule    Sig: Take 1 capsule (20 mg total) by mouth daily.    Dispense:  30 capsule    Refill:  0    To take with 50 mg   lisdexamfetamine (VYVANSE) 50 MG capsule    Sig: Take 1 capsule (50 mg total) by mouth daily.    Dispense:  30 capsule    Refill:  0

## 2022-01-29 DIAGNOSIS — D509 Iron deficiency anemia, unspecified: Secondary | ICD-10-CM | POA: Diagnosis not present

## 2022-01-29 DIAGNOSIS — Z8543 Personal history of malignant neoplasm of ovary: Secondary | ICD-10-CM | POA: Diagnosis not present

## 2022-01-29 DIAGNOSIS — Z90721 Acquired absence of ovaries, unilateral: Secondary | ICD-10-CM | POA: Diagnosis not present

## 2022-01-29 DIAGNOSIS — Z9079 Acquired absence of other genital organ(s): Secondary | ICD-10-CM | POA: Diagnosis not present

## 2022-01-29 DIAGNOSIS — C561 Malignant neoplasm of right ovary: Secondary | ICD-10-CM | POA: Diagnosis not present

## 2022-01-29 DIAGNOSIS — Z862 Personal history of diseases of the blood and blood-forming organs and certain disorders involving the immune mechanism: Secondary | ICD-10-CM | POA: Diagnosis not present

## 2022-02-06 ENCOUNTER — Ambulatory Visit: Payer: BC Managed Care – PPO | Admitting: Family Medicine

## 2022-02-12 ENCOUNTER — Encounter: Payer: Self-pay | Admitting: Sports Medicine

## 2022-02-12 ENCOUNTER — Ambulatory Visit (INDEPENDENT_AMBULATORY_CARE_PROVIDER_SITE_OTHER): Payer: BC Managed Care – PPO | Admitting: Sports Medicine

## 2022-02-12 DIAGNOSIS — F902 Attention-deficit hyperactivity disorder, combined type: Secondary | ICD-10-CM | POA: Diagnosis not present

## 2022-02-12 DIAGNOSIS — I10 Essential (primary) hypertension: Secondary | ICD-10-CM

## 2022-02-12 MED ORDER — BENAZEPRIL-HYDROCHLOROTHIAZIDE 20-12.5 MG PO TABS: 2.0000 | ORAL_TABLET | Freq: Every day | ORAL | 3 refills | Status: DC

## 2022-02-12 MED ORDER — LISDEXAMFETAMINE DIMESYLATE 70 MG PO CAPS: 70.0000 mg | ORAL_CAPSULE | Freq: Every day | ORAL | 0 refills | Status: DC

## 2022-02-12 NOTE — Assessment & Plan Note (Signed)
Stable and well-controlled, sending in a 49-monthsupply of Vyvanse.

## 2022-02-12 NOTE — Progress Notes (Signed)
    Procedures performed today:    None.  Independent interpretation of notes and tests performed by another provider:   None.  Brief History, Exam, Impression, and Recommendations:    Essential hypertension Needs a refill on benazepril, CMP was done earlier this month at an outside facility, renal function normal, potassium was just a touch low. Refilling this now.  ADHD Stable and well-controlled, sending in a 42-monthsupply of Vyvanse.    ____________________________________________ TGwen Her TDianah Field M.D., ABFM., CAQSM., AME. Primary Care and Sports Medicine [facility] Adjunct Professor of [employer]  FRisk manager

## 2022-02-12 NOTE — Assessment & Plan Note (Signed)
Needs a refill on benazepril, CMP was done earlier this month at an outside facility, renal function normal, potassium was just a touch low. Refilling this now.

## 2022-03-06 ENCOUNTER — Encounter: Payer: Self-pay | Admitting: Family Medicine

## 2022-03-06 ENCOUNTER — Ambulatory Visit (INDEPENDENT_AMBULATORY_CARE_PROVIDER_SITE_OTHER): Payer: BC Managed Care – PPO | Admitting: Family Medicine

## 2022-03-06 VITALS — BP 115/76 | HR 82 | Ht 62.0 in | Wt 218.0 lb

## 2022-03-06 DIAGNOSIS — H60331 Swimmer's ear, right ear: Secondary | ICD-10-CM | POA: Insufficient documentation

## 2022-03-06 MED ORDER — CIPROFLOXACIN-DEXAMETHASONE 0.3-0.1 % OT SUSP: 4.0000 [drp] | Freq: Two times a day (BID) | OTIC | 0 refills | Status: AC

## 2022-03-06 NOTE — Assessment & Plan Note (Signed)
-   otitis externa of R ear-erythema seen on exam. Will go ahead and treat with ciprodex

## 2022-03-06 NOTE — Progress Notes (Signed)
   Acute Office Visit  Subjective:     Patient ID: Yolanda Mosley, female    DOB: 1980-06-27, 41 y.o.   MRN: [id number]  Chief Complaint  Patient presents with   Ear Pain    HPI Patient is in today for R ear otalgia that started 3 days ago. Says symptoms have gotten worse. Notes pain on the inside of her ear as well as the pinna. Denies any drainage from her ear.   Review of Systems  Constitutional:  Negative for chills and fever.  HENT:  Positive for ear pain.   Respiratory:  Negative for cough and shortness of breath.   Cardiovascular:  Negative for chest pain.  Neurological:  Negative for headaches.        Objective:    BP 115/76   Pulse 82   Ht '5\' 2"'$  (1.575 m)   Wt 218 lb (98.9 kg)   SpO2 100%   BMI 39.87 kg/m    Physical Exam Vitals and nursing note reviewed.  Constitutional:      General: She is not in acute distress.    Appearance: Normal appearance.  HENT:     Head: Normocephalic and atraumatic.     Right Ear: External ear normal.     Left Ear: Tympanic membrane, ear canal and external ear normal.     Ears:     Comments: L external canal has erythema. Tenderness to palpation of the R pinna.    Nose: Nose normal.  Eyes:     Conjunctiva/sclera: Conjunctivae normal.  Cardiovascular:     Rate and Rhythm: Normal rate and regular rhythm.  Pulmonary:     Effort: Pulmonary effort is normal.     Breath sounds: Normal breath sounds.  Neurological:     General: No focal deficit present.     Mental Status: She is alert and oriented to person, place, and time.  Psychiatric:        Mood and Affect: Mood normal.        Behavior: Behavior normal.        Thought Content: Thought content normal.        Judgment: Judgment normal.     No results found for any visits on 03/06/22.      Assessment & Plan:   Problem List Items Addressed This Visit       Nervous and Auditory   Acute swimmer's ear of right side - Primary    - otitis externa of R ear-erythema seen  on exam. Will go ahead and treat with ciprodex       Relevant Medications   ciprofloxacin-dexamethasone (CIPRODEX) OTIC suspension    Meds ordered this encounter  Medications   ciprofloxacin-dexamethasone (CIPRODEX) OTIC suspension    Sig: Place 4 drops into the right ear 2 (two) times daily for 3 days.    Dispense:  7.5 mL    Refill:  0    Return if symptoms worsen or fail to improve.  Owens Loffler, DO

## 2022-03-12 ENCOUNTER — Other Ambulatory Visit: Payer: Self-pay | Admitting: Family Medicine

## 2022-03-16 ENCOUNTER — Other Ambulatory Visit: Payer: Self-pay | Admitting: Family Medicine

## 2022-03-17 DIAGNOSIS — I10 Essential (primary) hypertension: Secondary | ICD-10-CM | POA: Diagnosis not present

## 2022-03-17 DIAGNOSIS — L231 Allergic contact dermatitis due to adhesives: Secondary | ICD-10-CM | POA: Diagnosis not present

## 2022-03-17 DIAGNOSIS — M791 Myalgia, unspecified site: Secondary | ICD-10-CM | POA: Diagnosis not present

## 2022-03-17 DIAGNOSIS — E876 Hypokalemia: Secondary | ICD-10-CM | POA: Diagnosis not present

## 2022-03-17 DIAGNOSIS — Z85848 Personal history of malignant neoplasm of other parts of nervous tissue: Secondary | ICD-10-CM | POA: Diagnosis not present

## 2022-03-17 DIAGNOSIS — M79604 Pain in right leg: Secondary | ICD-10-CM | POA: Diagnosis not present

## 2022-03-17 DIAGNOSIS — Z885 Allergy status to narcotic agent status: Secondary | ICD-10-CM | POA: Diagnosis not present

## 2022-03-17 DIAGNOSIS — M7989 Other specified soft tissue disorders: Secondary | ICD-10-CM | POA: Diagnosis not present

## 2022-03-17 DIAGNOSIS — M79661 Pain in right lower leg: Secondary | ICD-10-CM | POA: Diagnosis not present

## 2022-03-19 ENCOUNTER — Encounter: Payer: Self-pay | Admitting: Family Medicine

## 2022-03-19 ENCOUNTER — Telehealth: Payer: Self-pay | Admitting: General Practice

## 2022-03-19 NOTE — Telephone Encounter (Signed)
Transition Care Management Follow-up Telephone Call Date of discharge and from where: 03/18/22 from [facility] How have you been since you were released from the hospital? Patient is not feeling better and is feeling worse. Has an appt for tomorrow with PCP.  Any questions or concerns? No  Items Reviewed: Did the pt receive and understand the discharge instructions provided? No  Medications obtained and verified? Yes  Other? No  Any new allergies since your discharge? No  Dietary orders reviewed? Yes Do you have support at home? Yes   Home Care and Equipment/Supplies: Were home health services ordered? no   Functional Questionnaire: (I = Independent and D = Dependent) ADLs: I  Bathing/Dressing- I  Meal Prep- I  Eating- I  Maintaining continence- I  Transferring/Ambulation- I  Managing Meds- I  Follow up appointments reviewed:  PCP Hospital f/u appt confirmed? Yes  Scheduled to see Dr. Madilyn Fireman  on 03/20/22. [facility] f/u appt confirmed? No   Are transportation arrangements needed? No  If their condition worsens, is the pt aware to call PCP or go to the Emergency Dept.? Yes Was the patient provided with contact information for the PCP's office or ED? Yes Was to pt encouraged to call back with questions or concerns? Yes

## 2022-03-20 ENCOUNTER — Encounter: Payer: Self-pay | Admitting: Family Medicine

## 2022-03-20 ENCOUNTER — Ambulatory Visit (INDEPENDENT_AMBULATORY_CARE_PROVIDER_SITE_OTHER): Payer: BC Managed Care – PPO

## 2022-03-20 ENCOUNTER — Ambulatory Visit: Payer: BC Managed Care – PPO | Admitting: Family Medicine

## 2022-03-20 VITALS — BP 135/85 | HR 88 | Ht 64.0 in | Wt 216.0 lb

## 2022-03-20 DIAGNOSIS — M25512 Pain in left shoulder: Secondary | ICD-10-CM

## 2022-03-20 DIAGNOSIS — M542 Cervicalgia: Secondary | ICD-10-CM

## 2022-03-20 DIAGNOSIS — M25571 Pain in right ankle and joints of right foot: Secondary | ICD-10-CM

## 2022-03-20 DIAGNOSIS — R2241 Localized swelling, mass and lump, right lower limb: Secondary | ICD-10-CM

## 2022-03-20 DIAGNOSIS — M79661 Pain in right lower leg: Secondary | ICD-10-CM | POA: Diagnosis not present

## 2022-03-20 DIAGNOSIS — R600 Localized edema: Secondary | ICD-10-CM | POA: Diagnosis not present

## 2022-03-20 DIAGNOSIS — R109 Unspecified abdominal pain: Secondary | ICD-10-CM

## 2022-03-20 DIAGNOSIS — R102 Pelvic and perineal pain: Secondary | ICD-10-CM

## 2022-03-20 DIAGNOSIS — M7989 Other specified soft tissue disorders: Secondary | ICD-10-CM | POA: Diagnosis not present

## 2022-03-20 NOTE — Progress Notes (Signed)
Acute Office Visit  Subjective:     Patient ID: Yolanda Mosley, female    DOB: 01-10-1981, 41 y.o.   MRN: [id number]  Chief Complaint  Patient presents with   Ankle Pain    HPI Patient is in today for Right ankle and leg pain that started 7 days ago. Started in the ankle.  No trauma or injury. Pt reports that she began experiencing R ankle and leg pain that travels up into her groin. She denies any injury/trauma. 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   The pain is a sharp and is 5/10 she has swelling in the leg, and redness in the ankle. Denies any SOB/CP. She said that the pain is worse at night.   She went to the emergency department on November 18 and had an ultrasound venous of the lower extremity on the right side and it was negative for any type of DVT.  PT/INR was normal.  CK was normal.  Potassium was borderline low.   He is also having some pain in her left shoulder.  No decreased range of motion but it is more painful if she fully extends the arm or flexes across her chest.  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 injury.  She was told at that time that it was a torn tendon and did some rehab and it did improve but over the last week it has started back again right around the time that she has had the pain and swelling in her right leg.   ROS      Objective:    BP 135/85   Pulse 88   Ht '5\' 4"'$  (1.626 m)   Wt 216 lb (98 kg)   SpO2 98%   BMI 37.08 kg/m    Physical Exam Vitals reviewed.  Constitutional:      Appearance: She is well-developed.  HENT:     Head: Normocephalic and atraumatic.  Eyes:     Conjunctiva/sclera: Conjunctivae normal.  Cardiovascular:     Rate and Rhythm: Normal rate.  Pulmonary:     Effort: Pulmonary effort is normal.  Musculoskeletal:     Comments: Right calf and ankle is swollen.  Trace pitting edema around the right ankle.  She is extremely  tender just posterior and inferior to the medial malleolus.  She has a dusky appearance of her inner left heel.  No open wound or trauma she does have some dry skin on the feet.  Ankle with normal range of motion.  Skin:    General: Skin is dry.     Coloration: Skin is not pale.  Neurological:     Mental Status: She is alert and oriented to person, place, and time.  Psychiatric:        Behavior: Behavior normal.     No results found for any visits on 03/20/22.      Assessment & Plan:   Problem List Items Addressed This Visit   None Visit Diagnoses     Acute right ankle pain    -  Primary   Relevant Orders   US Venous Img Lower Unilateral Right (Completed)   DG Ankle Complete Right   Localized swelling of right lower leg       Relevant Orders   US Venous Img Lower Unilateral Right (Completed)   DG Ankle Complete Right   Neck pain on left side  Relevant Orders   DG Shoulder Left       She is very tender medially over the right ankle-no known injury or trauma.  She does have some duskiness appearance towards the heel and is very tender around the medial malleolus.  We will get a plain film today of the ankle itself which is quite tender and swollen.  But she also has some swelling from the knee down, and her right calf is much larger than her left calf.  So it actually like to repeat her Doppler to look for DVT.  Consider some type of pelvic congestion that could also be causing her symptoms.    Localized swelling of the right lower extremity from the knee down.  Left shoulder pain-we will get plain films today just to rule out any underlying causes.  I think it is more consistent with a bursitis.  We will give her handout for stretches and exercises to do on her own at home, over the long weekend.  No orders of the defined types were placed in this encounter.   No follow-ups on file.  Beatrice Lecher, MD

## 2022-03-20 NOTE — Progress Notes (Signed)
Sri, no sign of DVT.  Still awaiting the x-ray results we will let you know as soon as those are back.  Are you having any lower abdominal pain or pelvic pain?

## 2022-03-20 NOTE — Progress Notes (Signed)
Pt reports that she began experiencing R ankle and leg pain that travels up into her groin. She denies any injury/trauma. 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  The pain is a sharp and is 5/10 she has swelling in the leg, and redness in the ankle. Denies any SOB/CP. She said that the pain is worse at night.

## 2022-03-21 NOTE — Progress Notes (Signed)
Since she has had some lower abdominal pain see if she would be willing to do a pelvic ultrasound for further work-up.  They did see some slightly enlarged lymph nodes in the groin but they were not worrisome in appearance.  But it does make me wonder if she could have something else going on more in the pelvis that could be causing that leg to swell.

## 2022-03-21 NOTE — Telephone Encounter (Signed)
Orders Placed This Encounter  Procedures   US ABDOMEN COMPLETE W/ELASTOGRAPHY    Standing Status:   Future    Standing Expiration Date:   03/22/2023    Order Specific Question:   Reason for Exam (SYMPTOM  OR DIAGNOSIS REQUIRED)    Answer:   Left sided abd pain    Order Specific Question:   Preferred imaging location?    Answer:   [facility]   US Pelvic Complete With Transvaginal    Standing Status:   Future    Standing Expiration Date:   03/22/2023    Order Specific Question:   Reason for Exam (SYMPTOM  OR DIAGNOSIS REQUIRED)    Answer:   Left sided pelvic pain with swellin in the right leg    Order Specific Question:   Preferred imaging location?    Answer:   [facility]

## 2022-03-26 ENCOUNTER — Ambulatory Visit (INDEPENDENT_AMBULATORY_CARE_PROVIDER_SITE_OTHER): Payer: BC Managed Care – PPO

## 2022-03-26 ENCOUNTER — Encounter: Payer: Self-pay | Admitting: Family Medicine

## 2022-03-26 DIAGNOSIS — R102 Pelvic and perineal pain unspecified side: Secondary | ICD-10-CM

## 2022-03-26 DIAGNOSIS — R109 Unspecified abdominal pain: Secondary | ICD-10-CM | POA: Diagnosis not present

## 2022-03-26 DIAGNOSIS — Z90722 Acquired absence of ovaries, bilateral: Secondary | ICD-10-CM | POA: Diagnosis not present

## 2022-03-26 DIAGNOSIS — N83202 Unspecified ovarian cyst, left side: Secondary | ICD-10-CM | POA: Diagnosis not present

## 2022-03-26 NOTE — Progress Notes (Signed)
Yolanda Mosley, x-ray of the left shoulder actually looks okay no sign of abnormal alignment or significant arthritis.  Again would like to get you in with Dr. Dianah Field if its not improving.  Do think formal physical therapy could actually be really helpful especially since you have been having intermittent pain.

## 2022-03-26 NOTE — Progress Notes (Signed)
Hi Quinne, just see some soft tissue swelling on the x-ray and a little heel spur on the from of the heel.  Recommend continue with compression using an Ace wrap for support and to help with the swelling as well as elevating and icing.  If not improving though not scheduling with Dr. Dianah Field our sports med doc for further work-up.

## 2022-03-26 NOTE — Progress Notes (Signed)
Hi Yolanda Mosley, did see a questionable area on the left ovary they think it just could be a bleeding cyst.  These are not uncommon and can definitely cause discomfort but they do usually go away.  But just to make sure that it is not a growth on the ovary they would like [place] to repeat the ultrasound in 6 to 12 weeks to make sure.

## 2022-03-27 ENCOUNTER — Ambulatory Visit (INDEPENDENT_AMBULATORY_CARE_PROVIDER_SITE_OTHER): Payer: BC Managed Care – PPO

## 2022-03-27 DIAGNOSIS — R109 Unspecified abdominal pain: Secondary | ICD-10-CM

## 2022-03-27 DIAGNOSIS — K760 Fatty (change of) liver, not elsewhere classified: Secondary | ICD-10-CM | POA: Diagnosis not present

## 2022-03-27 DIAGNOSIS — R102 Pelvic and perineal pain: Secondary | ICD-10-CM

## 2022-03-27 NOTE — Telephone Encounter (Signed)
Please contact patient to schedule new ortho for Dr. Darene Lamer.

## 2022-03-27 NOTE — Progress Notes (Signed)
Daley, ultrasound the abdomen just showed fatty liver.  Treatment for this is healthy diet cutting back on sweets and carbs and highly processed foods and getting regular exercise as well as weight loss.  Fatty liver can overtime start to scar and cause cirrhosis.  So it is important to work on healthy lifestyle to reduce the fat content of the liver.

## 2022-03-27 NOTE — Telephone Encounter (Signed)
Schedule with Dr. Darene Lamer for both issues.

## 2022-04-06 ENCOUNTER — Encounter: Payer: BC Managed Care – PPO | Admitting: Sports Medicine

## 2022-04-10 ENCOUNTER — Ambulatory Visit: Payer: BC Managed Care – PPO | Admitting: Sports Medicine

## 2022-04-10 ENCOUNTER — Encounter: Payer: Self-pay | Admitting: Family Medicine

## 2022-04-10 ENCOUNTER — Encounter: Payer: Self-pay | Admitting: Sports Medicine

## 2022-04-10 DIAGNOSIS — R600 Localized edema: Secondary | ICD-10-CM | POA: Diagnosis not present

## 2022-04-10 DIAGNOSIS — N83202 Unspecified ovarian cyst, left side: Secondary | ICD-10-CM

## 2022-04-10 DIAGNOSIS — M25512 Pain in left shoulder: Secondary | ICD-10-CM | POA: Diagnosis not present

## 2022-04-10 DIAGNOSIS — M76821 Posterior tibial tendinitis, right leg: Secondary | ICD-10-CM | POA: Insufficient documentation

## 2022-04-10 MED ORDER — MELOXICAM 15 MG PO TABS: ORAL_TABLET | ORAL | 3 refills | Status: DC

## 2022-04-10 NOTE — Assessment & Plan Note (Signed)
Spends a lot of time on her feet, over a month of pain at medial ankle behind the medial malleolus. On exam she has tenderness along the path of the tibialis posterior, she tends to pronate when standing and has a positive to many toes sign on the right. We are going to give her some walking restrictions at work, meloxicam as above, formal physical therapy and a referral for custom molded orthotics. If were unable to get her better after 4 to 6 weeks I would likely need her out on short-term disability.

## 2022-04-10 NOTE — Assessment & Plan Note (Signed)
Yolanda Mosley does have swelling the entire right lower extremity from the groin down. She did have a recent DVT ultrasound that did show nonpathologically enlarged lymph nodes in the groin. I explained her that lymphadenopathy could certainly cause edema and extremity. I also explained that we typically watch for a while then do antibiotics before considering biopsy. This is not a sports medicine issue she would like her to follow this up with her PCP.

## 2022-04-10 NOTE — Progress Notes (Signed)
    Procedures performed today:    None.  Independent interpretation of notes and tests performed by another provider:   None.  Brief History, Exam, Impression, and Recommendations:    Arthralgia of left acromioclavicular joint This is a pleasant 41 year old female, she works at [employer], [employer] and lifts lots of things. For the past month or so she has had increasing pain left shoulder localized at the acromioclavicular joint worse with crossing the arm. On exam she has tenderness at the acromioclavicular joint with a positive crossarm sign. No rotator cuff signs. X-rays were unrevealing. We will start conservatively with meloxicam, formal physical therapy, I will give her some lifting, pushing, pulling restrictions for work. If not better in 6 weeks we will inject the acromioclavicular joint.  Tibialis posterior tendinitis, right Spends a lot of time on her feet, over a month of pain at medial ankle behind the medial malleolus. On exam she has tenderness along the path of the tibialis posterior, she tends to pronate when standing and has a positive to many toes sign on the right. We are going to give her some walking restrictions at work, meloxicam as above, formal physical therapy and a referral for custom molded orthotics. If were unable to get her better after 4 to 6 weeks I would likely need her out on short-term disability.  Edema of right lower extremity Alletta does have swelling the entire right lower extremity from the groin down. She did have a recent DVT ultrasound that did show nonpathologically enlarged lymph nodes in the groin. I explained her that lymphadenopathy could certainly cause edema and extremity. I also explained that we typically watch for a while then do antibiotics before considering biopsy. This is not a sports medicine issue she would like her to follow this up with her PCP.    ____________________________________________ Gwen Her.  Dianah Field, M.D., ABFM., CAQSM., AME. Primary Care and Sports Medicine [facility]  Adjunct Professor of [place] [facility]  Risk manager

## 2022-04-10 NOTE — Assessment & Plan Note (Signed)
This is a pleasant 41 year old female, she works at [employer], [employer] and lifts lots of things. For the past month or so she has had increasing pain left shoulder localized at the acromioclavicular joint worse with crossing the arm. On exam she has tenderness at the acromioclavicular joint with a positive crossarm sign. No rotator cuff signs. X-rays were unrevealing. We will start conservatively with meloxicam, formal physical therapy, I will give her some lifting, pushing, pulling restrictions for work. If not better in 6 weeks we will inject the acromioclavicular joint.

## 2022-04-13 ENCOUNTER — Encounter: Payer: Self-pay | Admitting: Physical Therapy

## 2022-04-13 ENCOUNTER — Other Ambulatory Visit: Payer: Self-pay

## 2022-04-13 ENCOUNTER — Ambulatory Visit: Payer: BC Managed Care – PPO | Attending: Family Medicine | Admitting: Physical Therapy

## 2022-04-13 DIAGNOSIS — M25512 Pain in left shoulder: Secondary | ICD-10-CM | POA: Insufficient documentation

## 2022-04-13 DIAGNOSIS — R29898 Other symptoms and signs involving the musculoskeletal system: Secondary | ICD-10-CM | POA: Insufficient documentation

## 2022-04-13 DIAGNOSIS — M6281 Muscle weakness (generalized): Secondary | ICD-10-CM | POA: Insufficient documentation

## 2022-04-13 NOTE — Telephone Encounter (Signed)
See prior result note about diet, etc

## 2022-04-13 NOTE — Therapy (Signed)
OUTPATIENT PHYSICAL THERAPY SHOULDER EVALUATION   Patient Name: Yolanda Mosley MRN: [id number] DOB:May 23, 1980, 41 y.o., female Today's Date: 04/13/2022  END OF SESSION:  PT End of Session - 04/13/22 0839     Visit Number 1    Number of Visits 12    Date for PT Re-Evaluation 05/25/22    PT Start Time 0800    PT Stop Time 0845    PT Time Calculation (min) 45 min    Activity Tolerance Patient tolerated treatment well    Behavior During Therapy [facility] for tasks assessed/performed             Past Medical History:  Diagnosis Date   ADHD 03/01/2011   ALLERGIC RHINITIS 01/09/2010   Qualifier: Diagnosis of  By: Madilyn Fireman MD, Catherine     Allergy    Anemia    Anemia, iron deficiency 09/09/2013   Asthma    Bilateral low back pain without sciatica 08/10/2014   CA - cancer 2005   Germ Cell Tumor - Rt Ovary   DDD (degenerative disc disease), lumbar 08/11/2014   L5 and S1 via xrays 07/2014.     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  CPAP 7 cm water pressure.  Mild OSA AHI 8.7.    Ovarian cyst    left   Peripheral edema 10/22/2016   Post chemo evaluation  04/04/2015   Sleep apnea    Tachycardia    Venous stasis dermatitis of both lower extremities 10/23/2016   Past Surgical History:  Procedure Laterality Date   LEFT HEART CATH AND CORONARY ANGIOGRAPHY N/A 07/14/2020   Procedure: LEFT HEART CATH AND CORONARY ANGIOGRAPHY;  Surgeon: Troy Sine, MD;  Location: [facility];  Service: Cardiovascular;  Laterality: N/A;   OMENTECTOMY  2005   ovary, bilater fallopian tubes removed  2005   SALPINGOOPHORECTOMY Right    Patient Active Problem List   Diagnosis Date Noted   Arthralgia of left acromioclavicular joint 04/10/2022   Tibialis posterior tendinitis, right 04/10/2022   Acute swimmer's ear of right side 03/06/2022   Insomnia 08/08/2021   Subclinical hypothyroidism 08/10/2020   Abnormal cardiac CT angiography    Chest pain of uncertain etiology 01/75/1025   Edema of right lower extremity 06/27/2020   LVH (left ventricular hypertrophy) 06/25/2020   Ovarian cyst    Essential hypertension    Herpes simplex virus (HSV) infection    GERD (gastroesophageal reflux disease)    Asthma    Anemia    Allergy    Mild mitral regurgitation by prior echocardiogram 11/14/2016   Venous stasis dermatitis  of both lower extremities 10/23/2016   History of tachycardia 10/22/2016   Mild persistent asthma without complication 09/73/5329   Gastroesophageal reflux disease with esophagitis 08/16/2016   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   Post chemo evaluation 04/04/2015   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   DDD (degenerative disc disease), lumbar 08/11/2014   Muscle spasm of back 08/10/2014   Mid back pain 08/10/2014   Bilateral low back pain without sciatica 08/10/2014   History of ovarian cancer 01/20/2014   Gastric ulcer 09/14/2013   Anemia, iron deficiency 09/09/2013   Obesity 05/31/2011   ADHD 03/01/2011   OSA on CPAP 01/08/2011   EXTERNAL HEMORRHOIDS 01/09/2010   ALLERGIC RHINITIS 01/09/2010    PCP: Madilyn Fireman  REFERRING  PROVIDER: Dianah Field  REFERRING DIAG: arthralgia of Lt acromioclavicular joint  THERAPY DIAG:  Acute pain of left shoulder  Muscle weakness (generalized)  Other symptoms and signs involving the musculoskeletal system  Rationale for Evaluation and Treatment: Rehabilitation  ONSET DATE: 2021  SUBJECTIVE:                                                                                                                                                                                      SUBJECTIVE STATEMENT: 2-3 years ago pt was pulling shopping carts apart at her job at [employer] and felt a "pop" in her shoulder. She went to  urgent care and had to wear a sling but had no formal treatment. 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  Pain also increases with pushing up from the bed, pulling shopping carts. Pain eases with heat. Rt LE is constantly swollen and pt has had ankle pain starting in medial ankle sometimes going up to groin. She is no longer having pain but plans to follow up with MD about swelling.  PERTINENT HISTORY: None reported  PAIN:  Are you having pain? Yes: NPRS scale: 3-4/10 Pain location: Lt shoulder Pain description: achey, occasionally sharp Aggravating factors: lift, push/pull Relieving factors: heat  PRECAUTIONS: None  WEIGHT BEARING RESTRICTIONS: No  FALLS:  Has patient fallen in last 6 months? No  OCCUPATION: Freight forwarder at [employer], also coaches cheerleading  PLOF: Independent  PATIENT GOALS:reduce pain  NEXT MD VISIT:   OBJECTIVE:   DIAGNOSTIC FINDINGS:  Ultrasound negative for clots in Rt LE   POSTURE: Rounded shoulders  UPPER EXTREMITY ROM:   Active ROM Right eval Left eval  Shoulder flexion  170 pain  Shoulder extension  WFL  Shoulder abduction  170  Shoulder adduction  pain  Shoulder internal rotation  80 - pain  Shoulder external rotation  80  Elbow flexion    Elbow  extension    Wrist flexion    Wrist extension    Wrist ulnar deviation    Wrist radial deviation    Wrist pronation    Wrist supination    (Blank rows = not tested)  UPPER EXTREMITY MMT:  MMT Right eval Left eval  Shoulder flexion  4-/5pain  Shoulder extension  4-  Shoulder abduction  4  Shoulder adduction  4-/5 pain  Shoulder internal rotation    Shoulder external rotation    Middle trapezius    Lower trapezius    Elbow flexion  4  Elbow extension  4  Wrist flexion    Wrist extension    Wrist ulnar deviation    Wrist radial deviation    Wrist pronation    Wrist supination    Grip strength (lbs)    (Blank rows = not tested)  JOINT MOBILITY TESTING:  [place] jt normal mobility all directions  PALPATION:  TTP AC joint and along Lt clavical   TODAY'S TREATMENT:                                                                                                                                         [facility] Adult PT Treatment:                                                DATE: 04/13/22 Therapeutic Exercise: See HEP  Modalities: Moist heat x 10 mins Lt shoulder    PATIENT EDUCATION: Education details: PT POC and goals, HEP Person educated: Patient Education method: Explanation, Demonstration, and Handouts Education comprehension: verbalized understanding and returned demonstration  HOME EXERCISE PROGRAM: Access Code: [email or web address] URL: [email or web address] Date: 04/13/2022 Prepared by: Isabelle Course  Exercises - Serratus Activation at [place]  - 1 x daily - 7 x weekly - 2 sets - 10 reps - Shoulder External Rotation and Scapular Retraction with Resistance  - 1 x daily - 7 x weekly - 32 sets - 10 reps - Wall Clock with Theraband  - 1 x daily - 7 x weekly - 2 sets - 10 reps - Supine Scapular Protraction in Flexion with Dumbbells  - 1 x daily - 7 x weekly - 2 sets - 10 reps  ASSESSMENT:  CLINICAL IMPRESSION: Patient is a 41 y.o. female who was seen today  for physical therapy evaluation and treatment for arthralgia of Lt acromioclavicular joint. She presents with increased pain, decreased activity tolerance, decreased strength and impaired functional mobility. Pt will benefit from skilled PT to address deficits and improve functional activity tolerance for work and recreation. Also briefly assessed Rt ankle. Pt with no c/o pain currently in Rt ankle, no pain with resisted motion or PROM. No TTP. Pt to return  to MD to address swelling  OBJECTIVE IMPAIRMENTS: decreased activity tolerance, decreased ROM, decreased strength, impaired UE functional use, and pain.   ACTIVITY LIMITATIONS: carrying, lifting, and reach over head  PARTICIPATION LIMITATIONS: cleaning, laundry, community activity, and occupation  PERSONAL FACTORS: Time since onset of injury/illness/exacerbation are also affecting patient's functional outcome.   REHAB POTENTIAL: Good  CLINICAL DECISION MAKING: Evolving/moderate complexity  EVALUATION COMPLEXITY: Moderate   GOALS: Goals reviewed with patient? Yes  SHORT TERM GOALS: Target date: 04/27/2022    Pt will be independent in initial HEP Baseline: Goal status: INITIAL  LONG TERM GOALS: Target date: 05/25/2022    Pt will be independent in advanced HEP Baseline:  Goal status: INITIAL  2.  Pt will improve UE strength to 4+/5 to work and coach with decreased pain Baseline:  Goal status: INITIAL  3.  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  Pt will report working a full shift with pain <= 3/10 Baseline:  Goal status: INITIAL  PLAN:  PT FREQUENCY: 2x/week  PT DURATION: 6 weeks  PLANNED INTERVENTIONS: Therapeutic exercises, Therapeutic activity, Neuromuscular re-education, Balance training, Gait training, Patient/Family education, Self Care, Joint mobilization, Aquatic Therapy, Dry Needling, Electrical stimulation, Cryotherapy,  Moist heat, Taping, Vasopneumatic device, Ultrasound, Ionotophoresis '4mg'$ /ml Dexamethasone, Manual therapy, and Re-evaluation  PLAN FOR NEXT SESSION: progress postural strength and endurance, ionto?   Nefertiti Mohamad, PT 04/13/2022, 9:00 AM

## 2022-04-17 ENCOUNTER — Ambulatory Visit: Payer: BC Managed Care – PPO | Admitting: Physical Therapy

## 2022-04-17 ENCOUNTER — Encounter: Payer: Self-pay | Admitting: Physical Therapy

## 2022-04-17 DIAGNOSIS — R29898 Other symptoms and signs involving the musculoskeletal system: Secondary | ICD-10-CM

## 2022-04-17 DIAGNOSIS — M25512 Pain in left shoulder: Secondary | ICD-10-CM

## 2022-04-17 DIAGNOSIS — M6281 Muscle weakness (generalized): Secondary | ICD-10-CM

## 2022-04-17 NOTE — Therapy (Signed)
OUTPATIENT PHYSICAL THERAPY TREATMENT   Patient Name: Yolanda Mosley MRN: [id number] DOB:06/12/1980, 41 y.o., female Today's Date: 04/17/2022  END OF SESSION:  PT End of Session - 04/17/22 1558     Visit Number 2    Number of Visits 12    Date for PT Re-Evaluation 05/25/22    PT Start Time 1600    PT Stop Time 1640    PT Time Calculation (min) 40 min    Activity Tolerance Patient tolerated treatment well    Behavior During Therapy [facility] for tasks assessed/performed              Past Medical History:  Diagnosis Date   ADHD 03/01/2011   ALLERGIC RHINITIS 01/09/2010   Qualifier: Diagnosis of  By: Madilyn Fireman MD, Catherine     Allergy    Anemia    Anemia, iron deficiency 09/09/2013   Asthma    Bilateral low back pain without sciatica 08/10/2014   CA - cancer 2005   Germ Cell Tumor - Rt Ovary   DDD (degenerative disc disease), lumbar 08/11/2014   L5 and S1 via xrays 07/2014.     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  CPAP 7 cm water pressure.  Mild OSA AHI 8.7.    Ovarian cyst    left   Peripheral edema 10/22/2016   Post chemo evaluation 04/04/2015    Sleep apnea    Tachycardia    Venous stasis dermatitis of both lower extremities 10/23/2016   Past Surgical History:  Procedure Laterality Date   LEFT HEART CATH AND CORONARY ANGIOGRAPHY N/A 07/14/2020   Procedure: LEFT HEART CATH AND CORONARY ANGIOGRAPHY;  Surgeon: Troy Sine, MD;  Location: [facility];  Service: Cardiovascular;  Laterality: N/A;   OMENTECTOMY  2005   ovary, bilater fallopian tubes removed  2005   SALPINGOOPHORECTOMY Right    Patient Active Problem List   Diagnosis Date Noted   Arthralgia of left acromioclavicular joint 04/10/2022   Tibialis posterior tendinitis, right 04/10/2022   Acute swimmer's ear of right side 03/06/2022   Insomnia 08/08/2021   Subclinical hypothyroidism 08/10/2020   Abnormal cardiac CT angiography    Chest pain of uncertain etiology 40/97/3532   Edema of right lower extremity 06/27/2020   LVH (left ventricular hypertrophy) 06/25/2020   Ovarian cyst    Essential hypertension    Herpes simplex virus (HSV) infection    GERD (gastroesophageal reflux disease)    Asthma    Anemia    Allergy    Mild mitral regurgitation by prior echocardiogram 11/14/2016   Venous stasis dermatitis  of both lower extremities 10/23/2016   History of tachycardia 10/22/2016   Mild persistent asthma without complication 78/67/6720   Gastroesophageal reflux disease with esophagitis 08/16/2016   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   Post chemo evaluation 04/04/2015   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   DDD (degenerative disc disease), lumbar 08/11/2014   Muscle spasm of back 08/10/2014   Mid back pain 08/10/2014   Bilateral low back pain without sciatica 08/10/2014   History of ovarian cancer 01/20/2014   Gastric ulcer 09/14/2013   Anemia, iron deficiency 09/09/2013   Obesity 05/31/2011   ADHD 03/01/2011   OSA on CPAP 01/08/2011   EXTERNAL HEMORRHOIDS 01/09/2010   ALLERGIC RHINITIS 01/09/2010    PCP: Madilyn Fireman  REFERRING PROVIDER:  Dianah Field  REFERRING DIAG: arthralgia of Lt acromioclavicular joint  THERAPY DIAG:  Acute pain of left shoulder  Muscle weakness (generalized)  Other symptoms and signs involving the musculoskeletal system  Rationale for Evaluation and Treatment: Rehabilitation  ONSET DATE: 2021  SUBJECTIVE:                                                                                                                                                                                      SUBJECTIVE STATEMENT: Pt reports she can feel the shoulder but not painful.  Notes she was only able to remember the wall clock exercise.   PERTINENT HISTORY: From eval: 2-3 years ago pt was pulling shopping carts apart at her job at [employer] and felt a "pop" in her shoulder. She went to  urgent care and had to wear a sling but had no formal treatment. 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  Pain also increases with pushing up from the bed, pulling shopping carts. Pain eases with heat. Rt LE is constantly swollen and pt has had ankle pain starting in medial ankle sometimes going up to groin. She is no longer having pain but plans to follow up with MD about swelling.   PAIN:  Are you having pain? Yes: NPRS scale: 3-4/10 Pain location: Lt shoulder Pain description: achey, occasionally sharp Aggravating factors: lift, push/pull Relieving factors: heat  PRECAUTIONS: None  WEIGHT BEARING RESTRICTIONS: No  FALLS:  Has patient fallen in last 6 months? No  OCCUPATION: Freight forwarder at [employer], also coaches cheerleading  PLOF: Independent  PATIENT GOALS:reduce pain  NEXT MD VISIT:   OBJECTIVE:   UPPER EXTREMITY ROM:   Active ROM Right eval Left eval  Shoulder flexion  170 pain  Shoulder extension  WFL  Shoulder abduction  170  Shoulder adduction  pain  Shoulder internal rotation  80 -  pain  Shoulder external rotation  80  Elbow flexion     Elbow extension    Wrist flexion    Wrist extension    Wrist ulnar deviation    Wrist radial deviation    Wrist pronation    Wrist supination    (Blank rows = not tested)  UPPER EXTREMITY MMT:  MMT Right eval Left eval  Shoulder flexion  4-/5pain  Shoulder extension  4-  Shoulder abduction  4  Shoulder adduction  4-/5 pain  Shoulder internal rotation    Shoulder external rotation    Middle trapezius    Lower trapezius    Elbow flexion  4  Elbow extension  4  Wrist flexion    Wrist extension    Wrist ulnar deviation    Wrist radial deviation    Wrist pronation    Wrist supination    Grip strength (lbs)    (Blank rows = not tested)  PALPATION:  TTP AC joint and along Lt clavical  [facility] Adult PT Treatment:                                                DATE: 04/17/22 Therapeutic Exercise: Sitting pulley flexion, scaption, ER x 1 min each Doorway pec stretch 3 ways 2x30 sec Standing Shoulder ER red TB 2x10 "W" red TB 2x10 "T" red TB 2x10 Row green TB 2x10 Shoulder ext green TB 2x10 Neuromuscular re-ed: Push up plus against door x10 Supine push up plus with 5# 2x10 Wall clock red TB x10 with tactile cueing                                                                                                                               [facility] Adult PT Treatment:                                                DATE: 04/13/22 Therapeutic Exercise: See HEP  Modalities: Moist heat x 10 mins Lt shoulder    PATIENT EDUCATION: Education details: HEP updates/modifications Person educated: Patient Education method: Explanation, Demonstration, and Handouts Education comprehension: verbalized understanding and returned demonstration  HOME EXERCISE PROGRAM: Access Code: [email or web address] URL: [email or web address] Date: 04/17/2022 Prepared by: Estill Bamberg April Thurnell Garbe  Exercises - Shoulder External Rotation and Scapular Retraction with Resistance  - 1 x daily -  7 x weekly - 2 sets - 10 reps - Shoulder W - External Rotation with Resistance  - 1 x daily - 7 x weekly - 2 sets - 10 reps - Standing Shoulder Horizontal Abduction with Resistance  - 1 x daily - 7 x weekly - 2 sets - 10 reps - Wall Clock  with Theraband  - 1 x daily - 7 x weekly - 1 sets - 10 reps - Supine Scapular Protraction in Flexion with Dumbbells  - 1 x daily - 7 x weekly - 2 sets - 10 reps  ASSESSMENT:  CLINICAL IMPRESSION: Treatment session focused on periscapular strengthening. Pt demos scapular dyskinesis with [place] compensation. Worked on neuro re-ed to improve scapular movement this session.   OBJECTIVE IMPAIRMENTS: decreased activity tolerance, decreased ROM, decreased strength, impaired UE functional use, and pain.      GOALS: Goals reviewed with patient? Yes  SHORT TERM GOALS: Target date: 04/27/2022    Pt will be independent in initial HEP Baseline: Goal status: INITIAL  LONG TERM GOALS: Target date: 05/25/2022    Pt will be independent in advanced HEP Baseline:  Goal status: INITIAL  2.  Pt will improve UE strength to 4+/5 to work and coach with decreased pain Baseline:  Goal status: INITIAL  3.  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  Pt will report working a full shift with pain <= 3/10 Baseline:  Goal status: INITIAL  PLAN:  PT FREQUENCY: 2x/week  PT DURATION: 6 weeks  PLANNED INTERVENTIONS: Therapeutic exercises, Therapeutic activity, Neuromuscular re-education, Balance training, Gait training, Patient/Family education, Self Care, Joint mobilization, Aquatic Therapy, Dry Needling, Electrical stimulation, Cryotherapy, Moist heat, Taping, Vasopneumatic device, Ultrasound, Ionotophoresis '4mg'$ /ml Dexamethasone, Manual therapy, and Re-evaluation  PLAN FOR NEXT SESSION: scapular stability, progress postural strength and endurance, ionto?   Beau [place] April Ma L Alleta Avery,  PT 04/17/2022, 4:45 PM

## 2022-04-19 ENCOUNTER — Ambulatory Visit: Payer: BC Managed Care – PPO

## 2022-04-19 DIAGNOSIS — R29898 Other symptoms and signs involving the musculoskeletal system: Secondary | ICD-10-CM

## 2022-04-19 DIAGNOSIS — M25512 Pain in left shoulder: Secondary | ICD-10-CM

## 2022-04-19 DIAGNOSIS — M6281 Muscle weakness (generalized): Secondary | ICD-10-CM | POA: Diagnosis not present

## 2022-04-19 NOTE — Therapy (Signed)
OUTPATIENT PHYSICAL THERAPY TREATMENT   Patient Name: Yolanda Mosley MRN: [id number] DOB:05/02/1980, 41 y.o., female Today's Date: 04/19/2022  END OF SESSION:  PT End of Session - 04/19/22 0802     Visit Number 3    Number of Visits 12    Date for PT Re-Evaluation 05/25/22    PT Start Time 0803    PT Stop Time 5462    PT Time Calculation (min) 40 min    Activity Tolerance Patient tolerated treatment well    Behavior During Therapy [facility] for tasks assessed/performed              Past Medical History:  Diagnosis Date   ADHD 03/01/2011   ALLERGIC RHINITIS 01/09/2010   Qualifier: Diagnosis of  By: Madilyn Fireman MD, Catherine     Allergy    Anemia    Anemia, iron deficiency 09/09/2013   Asthma    Bilateral low back pain without sciatica 08/10/2014   CA - cancer 2005   Germ Cell Tumor - Rt Ovary   DDD (degenerative disc disease), lumbar 08/11/2014   L5 and S1 via xrays 07/2014.     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  CPAP 7 cm water pressure.  Mild OSA AHI 8.7.    Ovarian cyst    left   Peripheral edema 10/22/2016   Post chemo evaluation 04/04/2015    Sleep apnea    Tachycardia    Venous stasis dermatitis of both lower extremities 10/23/2016   Past Surgical History:  Procedure Laterality Date   LEFT HEART CATH AND CORONARY ANGIOGRAPHY N/A 07/14/2020   Procedure: LEFT HEART CATH AND CORONARY ANGIOGRAPHY;  Surgeon: Troy Sine, MD;  Location: [facility];  Service: Cardiovascular;  Laterality: N/A;   OMENTECTOMY  2005   ovary, bilater fallopian tubes removed  2005   SALPINGOOPHORECTOMY Right    Patient Active Problem List   Diagnosis Date Noted   Arthralgia of left acromioclavicular joint 04/10/2022   Tibialis posterior tendinitis, right 04/10/2022   Acute swimmer's ear of right side 03/06/2022   Insomnia 08/08/2021   Subclinical hypothyroidism 08/10/2020   Abnormal cardiac CT angiography    Chest pain of uncertain etiology 38/18/2993   Edema of right lower extremity 06/27/2020   LVH (left ventricular hypertrophy) 06/25/2020   Ovarian cyst    Essential hypertension    Herpes simplex virus (HSV) infection    GERD (gastroesophageal reflux disease)    Asthma    Anemia    Allergy    Mild mitral regurgitation by prior echocardiogram 11/14/2016   Venous stasis dermatitis  of both lower extremities 10/23/2016   History of tachycardia 10/22/2016   Mild persistent asthma without complication 92/42/6834   Gastroesophageal reflux disease with esophagitis 08/16/2016   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   Post chemo evaluation 04/04/2015   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   DDD (degenerative disc disease), lumbar 08/11/2014   Muscle spasm of back 08/10/2014   Mid back pain 08/10/2014   Bilateral low back pain without sciatica 08/10/2014   History of ovarian cancer 01/20/2014   Gastric ulcer 09/14/2013   Anemia, iron deficiency 09/09/2013   Obesity 05/31/2011   ADHD 03/01/2011   OSA on CPAP 01/08/2011   EXTERNAL HEMORRHOIDS 01/09/2010   ALLERGIC RHINITIS 01/09/2010    PCP: Madilyn Fireman  REFERRING PROVIDER:  Dianah Field  REFERRING DIAG: arthralgia of Lt acromioclavicular joint  THERAPY DIAG:  Acute pain of left shoulder  Muscle weakness (generalized)  Other symptoms and signs involving the musculoskeletal system  Rationale for Evaluation and Treatment: Rehabilitation  ONSET DATE: 2021  SUBJECTIVE:                                                                                                                                                                                      SUBJECTIVE STATEMENT: Pt reports an "achey" pain at posterior L shoulder today. Patient states her pain is worse first thing but decreases as day continues. Patient states she was able to review HEP.  PERTINENT HISTORY: From eval: 2-3 years ago pt was pulling shopping carts apart at her job at [employer] and felt a "pop" in her shoulder. She went to  urgent care and had to wear a sling but had no formal treatment. 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  Pain also increases with pushing up from the bed, pulling shopping carts. Pain eases with heat. Rt LE is constantly swollen and pt has had ankle pain starting in medial ankle sometimes going up to groin. She is no longer having pain but plans to follow up with MD about swelling.   PAIN:  Are you having pain? Yes: NPRS scale: 4/10 Pain location: Lt shoulder Pain description: achey, occasionally sharp Aggravating factors: lift, push/pull Relieving factors: heat  PRECAUTIONS: None  WEIGHT BEARING RESTRICTIONS: No  FALLS:  Has patient fallen in last 6 months? No  OCCUPATION: Freight forwarder at [employer], also coaches cheerleading  PLOF: Independent  PATIENT GOALS:reduce pain  NEXT MD VISIT:   OBJECTIVE:   UPPER EXTREMITY ROM:   Active ROM Right eval Left eval  Shoulder flexion  170 pain  Shoulder extension  [facility]  Shoulder abduction  170  Shoulder adduction  pain  Shoulder internal rotation  80 -  pain  Shoulder external rotation  80  Elbow flexion    Elbow extension    Wrist flexion    Wrist extension    Wrist ulnar deviation    Wrist radial deviation    Wrist pronation    Wrist supination    (Blank rows = not tested)  UPPER EXTREMITY MMT:  MMT Right eval Left eval  Shoulder flexion  4-/5pain  Shoulder extension  4-  Shoulder abduction  4  Shoulder adduction  4-/5 pain  Shoulder internal rotation    Shoulder external rotation    Middle trapezius    Lower trapezius    Elbow flexion  4  Elbow extension  4  Wrist flexion    Wrist extension    Wrist ulnar deviation    Wrist radial deviation    Wrist pronation    Wrist supination    Grip strength (lbs)    (Blank rows = not tested)  PALPATION:  TTP AC joint and along Lt clavical  [facility] Adult PT Treatment:                                                DATE: 04/19/2022 Therapeutic Exercise: Seated UT/LS stretch (L) 20"x2 each Bkwd shoulder circles x20 Shoulder pulleys flexion/scaption/ER x1 min each Doorway pec stretch 3x30" Rows GTB 2x15x3" Bow & arrow RTB x10 B Ws RTB 2x10 Shoulder ER w/abd YTB x10 SA wall slides no band x10, YTB x10 Shoulder ext GTB 2x10 Wall clock YTB x 5 sets B Push up plus  Modalities: Moist Heat L shoulder girdle x10 min    [facility] Adult PT Treatment:                                                DATE: 04/17/22 Therapeutic Exercise: Sitting pulley flexion, scaption, ER x 1 min each Doorway pec stretch 3 ways 2x30 sec Standing Shoulder ER red TB 2x10 "W" red TB 2x10 "T" red TB 2x10 Row green TB 2x10 Shoulder ext greenTB 2x10 Neuromuscular re-ed: Push up plus against door x10 Supine push up plus with 5# 2x10 Wall clock red TB x10 with tactile cueing                                                                                                                               PATIENT EDUCATION: Education details: Sleeping with pillow for L arm/shoulder support Person  educated: Patient Education method: Explanation, Demonstration, and Handouts Education comprehension: verbalized understanding and returned demonstration  HOME EXERCISE PROGRAM: Access Code: [email or web address] URL: [email or web address] Date: 04/19/2022 Prepared by: Helane Gunther  Exercises - Shoulder External Rotation and  Scapular Retraction with Resistance  - 1 x daily - 7 x weekly - 2 sets - 10 reps - Shoulder W - External Rotation with Resistance  - 1 x daily - 7 x weekly - 2 sets - 10 reps - Standing Shoulder Horizontal Abduction with Resistance  - 1 x daily - 7 x weekly - 2 sets - 10 reps - Wall Clock with Theraband  - 1 x daily - 7 x weekly - 1 sets - 10 reps - Supine Scapular Protraction in Flexion with Dumbbells  - 1 x daily - 7 x weekly - 2 sets - 10 reps - Seated Upper Trapezius Stretch  - 1 x daily - 7 x weekly - 3 sets - 5 reps - 20-30 sec hold - Seated Levator Scapulae Stretch  - 1 x daily - 7 x weekly - 3 sets - 5 reps - 20-30 sec hold - Forearm Walks on Wall with Resistance Band  - 1 x daily - 7 x weekly - 3 sets - 10 reps  ASSESSMENT:  CLINICAL IMPRESSION:  Tactile cues provided to improve scapula retraction and decrease upper trapezius compensation. Postural strengthening progressed with overhead arm wall slides to challenge scapular stability. Patient continues to demonstrate moderate upper trapezius compensation during resisted shoulder extension.    OBJECTIVE IMPAIRMENTS: decreased activity tolerance, decreased ROM, decreased strength, impaired UE functional use, and pain.      GOALS: Goals reviewed with patient? Yes  SHORT TERM GOALS: Target date: 04/27/2022    Pt will be independent in initial HEP Baseline: Goal status: INITIAL  LONG TERM GOALS: Target date: 05/25/2022    Pt will be independent in advanced HEP Baseline:  Goal status: INITIAL  2.  Pt will improve UE strength to 4+/5 to work and coach with decreased pain Baseline:  Goal  status: INITIAL  3.  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  Pt will report working a full shift with pain <= 3/10 Baseline:  Goal status: INITIAL  PLAN:  PT FREQUENCY: 2x/week  PT DURATION: 6 weeks  PLANNED INTERVENTIONS: Therapeutic exercises, Therapeutic activity, Neuromuscular re-education, Balance training, Gait training, Patient/Family education, Self Care, Joint mobilization, Aquatic Therapy, Dry Needling, Electrical stimulation, Cryotherapy, Moist heat, Taping, Vasopneumatic device, Ultrasound, Ionotophoresis '4mg'$ /ml Dexamethasone, Manual therapy, and Re-evaluation  PLAN FOR NEXT SESSION: Challenge scapular stability, progress postural strength and endurance; moist heat as needed   Hardin Negus, PTA 04/19/2022, 8:44 AM

## 2022-04-20 DIAGNOSIS — R519 Headache, unspecified: Secondary | ICD-10-CM | POA: Diagnosis not present

## 2022-04-20 DIAGNOSIS — R55 Syncope and collapse: Secondary | ICD-10-CM | POA: Diagnosis not present

## 2022-04-20 DIAGNOSIS — S199XXA Unspecified injury of neck, initial encounter: Secondary | ICD-10-CM | POA: Diagnosis not present

## 2022-04-20 DIAGNOSIS — S0083XA Contusion of other part of head, initial encounter: Secondary | ICD-10-CM | POA: Diagnosis not present

## 2022-04-20 DIAGNOSIS — G44319 Acute post-traumatic headache, not intractable: Secondary | ICD-10-CM | POA: Diagnosis not present

## 2022-04-24 ENCOUNTER — Encounter: Payer: Self-pay | Admitting: Family Medicine

## 2022-04-24 ENCOUNTER — Ambulatory Visit (INDEPENDENT_AMBULATORY_CARE_PROVIDER_SITE_OTHER): Payer: BC Managed Care – PPO | Admitting: Family Medicine

## 2022-04-24 ENCOUNTER — Ambulatory Visit: Payer: BC Managed Care – PPO

## 2022-04-24 VITALS — BP 104/65 | HR 70 | Temp 98.0°F | Ht 64.0 in | Wt 217.1 lb

## 2022-04-24 DIAGNOSIS — R29898 Other symptoms and signs involving the musculoskeletal system: Secondary | ICD-10-CM | POA: Diagnosis not present

## 2022-04-24 DIAGNOSIS — M6281 Muscle weakness (generalized): Secondary | ICD-10-CM | POA: Diagnosis not present

## 2022-04-24 DIAGNOSIS — M25512 Pain in left shoulder: Secondary | ICD-10-CM | POA: Diagnosis not present

## 2022-04-24 DIAGNOSIS — S060X1D Concussion with loss of consciousness of 30 minutes or less, subsequent encounter: Secondary | ICD-10-CM | POA: Diagnosis not present

## 2022-04-24 NOTE — Progress Notes (Signed)
Acute Office Visit  Subjective:     Patient ID: Yolanda Mosley, female    DOB: Dec 12, 1980, 41 y.o.   MRN: [id number]  Chief Complaint  Patient presents with   scalp/ head pain    Continuing aching head pain - top of head - after fall with injury on Friday 04/20/22. Pain with facial movements of touching the site of injury.     HPI Patient is in today for acute visit.  Pt reports she was at work on last Friday Dec 22nd. 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 She doesn't remember anything after she fell. She reports she was having pain after the incident. She doesn't know how long she was out. She says this all happened at noon. She waited a little bit but after 30 mins, and after eating, she felt nauseated. She told her Freight forwarder and went to personnel office. She explained details and was advised to go to ER around 3pm. She went to [facility] and had tests done. EKG NSR, CT head negative, labs normal except for slightly low potassium. She was told she had normal labs and imaging along with negative cervical xray.  Pt reports today this isn't the first time she hit her head on this area. She also reports her head is still sore to touch. She was confused initially after the incident but all those symptoms have resolved. She drove herself today. No issues reported other than head pain.  Review of Systems  Constitutional:        Head pain where she bumped her head against concrete wall  Cardiovascular:  Negative for chest pain.  Neurological:  Negative for dizziness, focal weakness, loss of consciousness, weakness and headaches.  All other systems reviewed and are negative.       Objective:    BP 104/65   Pulse 70   Temp 98 F (36.7 C)   Ht '5\' 4"'$  (1.626 m)   Wt 217 lb 1 oz (98.5 kg)   SpO2 98%   BMI 37.26 kg/m    Physical Exam Vitals reviewed.  Constitutional:       Appearance: Normal appearance. She is normal weight.  HENT:     Head: Normocephalic and atraumatic.     Right Ear: External ear normal.     Left Ear: External ear normal.  Eyes:     Conjunctiva/sclera: Conjunctivae normal.     Pupils: Pupils are equal, round, and reactive to light.  Skin:    Capillary Refill: Capillary refill takes less than 2 seconds.  Neurological:     General: No focal deficit present.     Mental Status: She is alert and oriented to person, place, and time. Mental status is at baseline.     Cranial Nerves: No cranial nerve deficit.     Sensory: No sensory deficit.     Motor: No weakness.     Coordination: Coordination normal.     Gait: Gait normal.     Deep Tendon Reflexes: Reflexes normal.  Psychiatric:        Mood and Affect: Mood normal.        Behavior: Behavior normal.        Thought Content: Thought content normal.        Judgment: Judgment normal.    No results found for any visits on 04/24/22.  Assessment & Plan:   Problem List Items Addressed This Visit   None Visit Diagnoses     Concussion with loss of consciousness of 30 minutes or less, subsequent encounter    -  Primary     Neurologically intact today. Normal ER work up. 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 This caused the syncopal episode. No red flag symptoms today. CN intact and normal exam. Drove herself to appt today.    No orders of the defined types were placed in this encounter.   Return if symptoms worsen or fail to improve.  Leeanne Rio, MD

## 2022-04-24 NOTE — Therapy (Signed)
OUTPATIENT PHYSICAL THERAPY TREATMENT   Patient Name: Yolanda Mosley MRN: [id number] DOB:[birth year], 41 y.o., female Today's Date: 04/24/2022  END OF SESSION:  PT End of Session - 04/24/22 1011     Visit Number 4    Number of Visits 12    Date for PT Re-Evaluation 05/25/22    PT Start Time 0920    PT Stop Time 1013    PT Time Calculation (min) 53 min              Past Medical History:  Diagnosis Date   ADHD 03/01/2011   ALLERGIC RHINITIS 01/09/2010   Qualifier: Diagnosis of  By: Madilyn Fireman MD, Catherine     Allergy    Anemia    Anemia, iron deficiency 09/09/2013   Asthma    Bilateral low back pain without sciatica 08/10/2014   CA - cancer 2005   Germ Cell Tumor - Rt Ovary   DDD (degenerative disc disease), lumbar 08/11/2014   L5 and S1 via xrays 07/2014.     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  CPAP 7 cm water pressure.  Mild OSA AHI 8.7.    Ovarian cyst    left   Peripheral edema 10/22/2016   Post chemo evaluation 04/04/2015   Sleep apnea    Tachycardia    Venous stasis dermatitis of both lower extremities 10/23/2016   Past Surgical  History:  Procedure Laterality Date   LEFT HEART CATH AND CORONARY ANGIOGRAPHY N/A 07/14/2020   Procedure: LEFT HEART CATH AND CORONARY ANGIOGRAPHY;  Surgeon: Troy Sine, MD;  Location: [facility];  Service: Cardiovascular;  Laterality: N/A;   OMENTECTOMY  2005   ovary, bilater fallopian tubes removed  2005   SALPINGOOPHORECTOMY Right    Patient Active Problem List   Diagnosis Date Noted   Arthralgia of left acromioclavicular joint 04/10/2022   Tibialis posterior tendinitis, right 04/10/2022   Acute swimmer's ear of right side 03/06/2022   Insomnia 08/08/2021   Subclinical hypothyroidism 08/10/2020   Abnormal cardiac CT angiography    Chest pain of uncertain etiology 01/60/1093   Edema of right lower extremity 06/27/2020   LVH (left ventricular hypertrophy) 06/25/2020   Ovarian cyst    Essential hypertension    Herpes simplex virus (HSV) infection    GERD (gastroesophageal reflux disease)    Asthma    Anemia    Allergy    Mild mitral regurgitation by prior echocardiogram 11/14/2016   Venous stasis dermatitis of both lower extremities 10/23/2016   History of tachycardia 10/22/2016   Mild persistent asthma without complication 23/55/7322  Gastroesophageal reflux disease with esophagitis 08/16/2016   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   Post chemo evaluation 04/04/2015   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   DDD (degenerative disc disease), lumbar 08/11/2014   Muscle spasm of back 08/10/2014   Mid back pain 08/10/2014   Bilateral low back pain without sciatica 08/10/2014   History of ovarian cancer 01/20/2014   Gastric ulcer 09/14/2013   Anemia, iron deficiency 09/09/2013   Obesity 05/31/2011   ADHD 03/01/2011   OSA on CPAP 01/08/2011   EXTERNAL HEMORRHOIDS 01/09/2010   ALLERGIC RHINITIS 01/09/2010    PCP: Madilyn Fireman  REFERRING PROVIDER: Dianah Field  REFERRING DIAG: arthralgia of Lt acromioclavicular joint  THERAPY DIAG:  Acute pain of left  shoulder  Muscle weakness (generalized)  Other symptoms and signs involving the musculoskeletal system  Rationale for Evaluation and Treatment: Rehabilitation  ONSET DATE: 2021  SUBJECTIVE:                                                                                                                                                                                      SUBJECTIVE STATEMENT:  Patient reports she was at work and was pushing carts at work on Friday when she apparently walked into a brick wall, hit her head and fell backwards and passed out briefly. Patient states she has no memory of hitting her head or passing out. Patient states she went to hospital where she had a CT scan, x-ray of neck and blood work done; patient states she was told she did not a concussion and needed to follow up with PCP. Patient states she has had headaches since Friday and is having mild posterior shoulder pain today.  PERTINENT HISTORY: From eval: 2-3 years ago pt was pulling shopping carts apart at her job at [employer] and felt a "pop" in her shoulder. She went to  urgent care and had to wear a sling but had no formal treatment. 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  Pain also increases with pushing up from the bed, pulling shopping carts. Pain eases with heat. Rt LE is constantly swollen and pt has had ankle pain starting in medial ankle sometimes going up to groin. She is no longer having pain but plans to follow up with MD about swelling.   PAIN:  Are you having pain? Yes: NPRS scale: 4/10 Pain location: Lt shoulder Pain description: achey, occasionally sharp Aggravating factors: lift, push/pull Relieving factors: heat  PRECAUTIONS: None  WEIGHT BEARING RESTRICTIONS: No  FALLS:  Has patient fallen in last 6 months? No  OCCUPATION: Freight forwarder at [employer], also Patent examiner  PLOF: Independent  PATIENT GOALS:reduce  pain  NEXT MD VISIT:   OBJECTIVE:   UPPER EXTREMITY ROM:   Active ROM Right eval Left eval  Shoulder flexion  170 pain  Shoulder extension  WFL  Shoulder abduction  170  Shoulder adduction  pain  Shoulder internal rotation  80 - pain  Shoulder external rotation  80  Elbow flexion    Elbow extension    Wrist flexion    Wrist extension    Wrist ulnar deviation    Wrist radial deviation    Wrist pronation    Wrist supination    (Blank rows = not tested)  UPPER EXTREMITY MMT:  MMT Right eval Left eval  Shoulder flexion  4-/5pain  Shoulder extension  4-  Shoulder abduction  4  Shoulder adduction  4-/5 pain  Shoulder internal rotation    Shoulder external rotation    Middle trapezius    Lower trapezius    Elbow flexion  4  Elbow extension  4  Wrist flexion    Wrist extension    Wrist ulnar deviation    Wrist radial deviation    Wrist pronation    Wrist supination    Grip strength (lbs)    (Blank rows = not tested)  PALPATION:  TTP AC joint and along Lt clavical  [facility] Adult PT Treatment:                                                DATE: 04/24/2022 Therapeutic Exercise: Seated UT/LS stretches x20" each B + subjective intake Standing UBE warm up L4 Fwd/Bkwd x2 min each Shoulder shrugs (seated) 1#DB, slightly IR/abd 2x10 Seated L unilateral shoulder ER YTB 2x15 Standing unilateral straight arm abd RTB 2x10 Shoulder IR/ER isometric step outs RTB x10 each Wall clock YTB x 5 sets B --> modified quadruped (discontinued d/t pain) Pulleys: flexion/scaption/ER x1 min each L Pendulum 3#AW: A/P, L/M, circles Manual Therapy: STM L shoulder girdle, subscap  Modalities: Moist Heat L shoulder girdle x10 min   [facility] Adult PT Treatment:                                                DATE: 04/19/2022 Therapeutic Exercise: Seated UT/LS stretch (L) 20"x2 each Bkwd shoulder circles x20 Shoulder pulleys flexion/scaption/ER x1 min each Doorway pec stretch 3x30" Rows  GTB 2x15x3" Bow & arrow RTB x10 B Ws RTB 2x10 Shoulder ER w/abd YTB x10 SA wall slides no band x10, YTB x10 Shoulder ext GTB 2x10 Wall clock YTB x 5 sets B Push up plus  Modalities: Moist Heat L shoulder girdle x10 min  PATIENT EDUCATION: Education details: Monitoring symptoms post head injury at work Person educated: Patient Education method: Consulting civil engineer, Media planner, and Handouts Education comprehension: verbalized understanding and returned demonstration  HOME EXERCISE PROGRAM: Access Code: [email or web address] URL: [email or web address] Date: 04/19/2022 Prepared by: Helane Gunther  Exercises - Shoulder External Rotation and Scapular Retraction with Resistance  - 1 x daily - 7 x weekly - 2 sets - 10 reps - Shoulder W - External Rotation with Resistance  - 1 x daily - 7 x weekly - 2 sets - 10 reps - Standing Shoulder Horizontal Abduction with Resistance  - 1 x daily - 7 x weekly - 2 sets - 10 reps - Wall Clock with Theraband  - 1 x daily - 7 x weekly - 1 sets - 10 reps - Supine Scapular Protraction in Flexion with Dumbbells  - 1 x daily - 7 x weekly - 2 sets - 10 reps - Seated Upper Trapezius Stretch  - 1 x daily - 7 x weekly - 3 sets - 5 reps - 20-30 sec hold - Seated Levator Scapulae Stretch  - 1 x daily - 7 x weekly - 3 sets - 5 reps - 20-30 sec hold - Forearm Walks on Wall with Resistance Band  - 1 x daily - 7 x weekly - 3 sets - 10 reps  ASSESSMENT:  CLINICAL IMPRESSION:  Modified quadruped attempted to progress shoulder stabilization and UE weight bearing. Patinet demonstrated moderate difficulty maintaining scapula stabilization with UE reaching; tactile cueing provided minimal improvement. Shoulder strengthening progressed with isometric strengthening of internal/external rotators. Discussion with patient in monitoring and logging notes on symptoms  since work head injury to take to PCP once appointment is scheduled.   OBJECTIVE IMPAIRMENTS: decreased activity tolerance, decreased ROM, decreased strength, impaired UE functional use, and pain.      GOALS: Goals reviewed with patient? Yes  SHORT TERM GOALS: Target date: 04/27/2022    Pt will be independent in initial HEP Baseline: Goal status: INITIAL  LONG TERM GOALS: Target date: 05/25/2022    Pt will be independent in advanced HEP Baseline:  Goal status: INITIAL  2.  Pt will improve UE strength to 4+/5 to work and coach with decreased pain Baseline:  Goal status: INITIAL  3.  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  Pt will report working a full shift with pain <= 3/10 Baseline:  Goal status: INITIAL  PLAN:  PT FREQUENCY: 2x/week  PT DURATION: 6 weeks  PLANNED INTERVENTIONS: Therapeutic exercises, Therapeutic activity, Neuromuscular re-education, Balance training, Gait training, Patient/Family education, Self Care, Joint mobilization, Aquatic Therapy, Dry Needling, Electrical stimulation, Cryotherapy, Moist heat, Taping, Vasopneumatic device, Ultrasound, Ionotophoresis '4mg'$ /ml Dexamethasone, Manual therapy, and Re-evaluation  PLAN FOR NEXT SESSION: Follow up on symptoms post work head injury (PCP appt scheduled?).Challenge scapular stability, progress postural strength and endurance; moist heat as needed   Hardin Negus, PTA 04/24/2022, 10:16 AM

## 2022-04-25 ENCOUNTER — Telehealth: Payer: Self-pay | Admitting: General Practice

## 2022-04-25 NOTE — Telephone Encounter (Signed)
Transition Care Management Follow-up Telephone Call Date of discharge and from where: 04/21/22 from [place] How have you been since you were released from the hospital? Patient had OV with Dr. Huel Cote on 04/24/22. Any questions or concerns? No

## 2022-04-26 ENCOUNTER — Ambulatory Visit: Payer: BC Managed Care – PPO

## 2022-05-02 ENCOUNTER — Ambulatory Visit: Payer: BC Managed Care – PPO | Attending: Sports Medicine

## 2022-05-02 DIAGNOSIS — M25512 Pain in left shoulder: Secondary | ICD-10-CM | POA: Diagnosis not present

## 2022-05-02 DIAGNOSIS — R29898 Other symptoms and signs involving the musculoskeletal system: Secondary | ICD-10-CM

## 2022-05-02 DIAGNOSIS — M6281 Muscle weakness (generalized): Secondary | ICD-10-CM | POA: Insufficient documentation

## 2022-05-02 NOTE — Therapy (Signed)
OUTPATIENT PHYSICAL THERAPY TREATMENT   Patient Name: Yolanda Mosley MRN: [id number] DOB:[birth year], 42 y.o., female Today's Date: 05/02/2022  END OF SESSION:  PT End of Session - 05/02/22 0800     Visit Number 5    Number of Visits 12    Date for PT Re-Evaluation 05/25/22    PT Start Time 0800    PT Stop Time 0840    PT Time Calculation (min) 40 min    Activity Tolerance Patient tolerated treatment well    Behavior During Therapy [facility] for tasks assessed/performed              Past Medical History:  Diagnosis Date   ADHD 03/01/2011   ALLERGIC RHINITIS 01/09/2010   Qualifier: Diagnosis of  By: Madilyn Fireman MD, Catherine     Allergy    Anemia    Anemia, iron deficiency 09/09/2013   Asthma    Bilateral low back pain without sciatica 08/10/2014   CA - cancer 2005   Germ Cell Tumor - Rt Ovary   DDD (degenerative disc disease), lumbar 08/11/2014   L5 and S1 via xrays 07/2014.     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  CPAP 7 cm water pressure.  Mild OSA AHI 8.7.    Ovarian cyst    left   Peripheral edema 10/22/2016   Post chemo evaluation 04/04/2015    Sleep apnea    Tachycardia    Venous stasis dermatitis of both lower extremities 10/23/2016   Past Surgical History:  Procedure Laterality Date   LEFT HEART CATH AND CORONARY ANGIOGRAPHY N/A 07/14/2020   Procedure: LEFT HEART CATH AND CORONARY ANGIOGRAPHY;  Surgeon: Troy Sine, MD;  Location: [facility];  Service: Cardiovascular;  Laterality: N/A;   OMENTECTOMY  2005   ovary, bilater fallopian tubes removed  2005   SALPINGOOPHORECTOMY Right    Patient Active Problem List   Diagnosis Date Noted   Arthralgia of left acromioclavicular joint 04/10/2022   Tibialis posterior tendinitis, right 04/10/2022   Acute swimmer's ear of right side 03/06/2022   Insomnia 08/08/2021   Subclinical hypothyroidism 08/10/2020   Abnormal cardiac CT angiography    Chest pain of uncertain etiology 60/01/9322   Edema of right lower extremity 06/27/2020   LVH (left ventricular hypertrophy) 06/25/2020   Ovarian cyst    Essential hypertension    Herpes simplex virus (HSV) infection    GERD (gastroesophageal reflux disease)    Asthma    Anemia    Allergy    Mild mitral regurgitation by prior echocardiogram 11/14/2016   Venous stasis dermatitis  of both lower extremities 10/23/2016   History of tachycardia 10/22/2016   Mild persistent asthma without complication 15/08/6977   Gastroesophageal reflux disease with esophagitis 08/16/2016   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   Post chemo evaluation 04/04/2015   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   DDD (degenerative disc disease), lumbar 08/11/2014   Muscle spasm of back 08/10/2014   Mid back pain 08/10/2014   Bilateral low back pain without sciatica 08/10/2014   History of ovarian cancer 01/20/2014   Gastric ulcer 09/14/2013   Anemia, iron deficiency 09/09/2013   Obesity 05/31/2011   ADHD 03/01/2011   OSA on CPAP 01/08/2011   EXTERNAL HEMORRHOIDS 01/09/2010   ALLERGIC RHINITIS 01/09/2010    PCP: Madilyn Fireman  REFERRING PROVIDER:  Dianah Field  REFERRING DIAG: arthralgia of Lt acromioclavicular joint  THERAPY DIAG:  Acute pain of left shoulder  Muscle weakness (generalized)  Other symptoms and signs involving the musculoskeletal system  Rationale for Evaluation and Treatment: Rehabilitation  ONSET DATE: 2021  SUBJECTIVE:                                                                                                                                                                                      SUBJECTIVE STATEMENT:  Patient reports her head is feeling better has been having less headaches. Patient states she has been dealing with an ear infection for a few days. Patient states she continues to have mild pain along top of L shoulder.   PERTINENT HISTORY: From eval: 2-3 years ago pt was pulling shopping carts apart at her job at [employer] and felt a "pop" in her shoulder. She went to  urgent care and had to wear a sling but had no formal treatment. 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  Pain also increases with pushing up from the bed, pulling shopping carts. Pain eases with heat. Rt LE is constantly swollen and pt has had ankle pain starting in medial ankle sometimes going up to groin. She is no longer having pain but plans to follow up with MD about swelling.   PAIN:  Are you having pain? Yes: NPRS scale: 3-4/10 Pain location: Lt shoulder Pain description: achey, occasionally sharp Aggravating factors: lift, push/pull Relieving factors: heat  PRECAUTIONS: None  WEIGHT BEARING RESTRICTIONS: No  FALLS:  Has patient fallen in last 6 months? No  OCCUPATION: Freight forwarder at [employer], also coaches cheerleading  PLOF: Independent  PATIENT GOALS:reduce pain  NEXT MD VISIT:   OBJECTIVE:   UPPER EXTREMITY ROM:   Active ROM Right eval Left eval  Shoulder flexion  170 pain  Shoulder extension  [facility]  Shoulder abduction  170  Shoulder  adduction  pain  Shoulder internal rotation  80 - pain  Shoulder external rotation  80  Elbow flexion    Elbow extension    Wrist flexion    Wrist extension    Wrist ulnar deviation    Wrist radial deviation    Wrist pronation    Wrist supination    (Blank rows = not tested)  UPPER EXTREMITY MMT:  MMT Right eval Left eval  Shoulder flexion  4-/5pain  Shoulder extension  4-  Shoulder abduction  4  Shoulder adduction  4-/5 pain  Shoulder internal rotation    Shoulder external rotation    Middle trapezius    Lower trapezius    Elbow flexion  4  Elbow extension  4  Wrist flexion    Wrist extension    Wrist ulnar deviation    Wrist radial deviation    Wrist pronation    Wrist supination    Grip strength (lbs)    (Blank rows = not tested)  PALPATION:  TTP AC joint and along Lt clavical  [facility] Adult PT Treatment:                                                DATE: 05/02/2022 Therapeutic Exercise: Pulleys flex/scap/ER x2 min each Shoulder flexion counter stretch UBE fwd/bkwd 56mn each Seated shoulder shrugs x10 --> bkwd shoulder circles x10 Resisted shoulder extension GTB 2x10x3" Resisted rows BTB 2x10 Resisted Ws RTB x15 Seated resisted unilateral L ER YTB 2x10 Standing resisted L shoulder abd YTB 2x10 Resisted L shoulder IR YTB 2x10 Resisted L D2 flexion YTB 2x10 SA arm wall slides (strap for isometric abd activation) x10 --> added lift off Standing L shoudler IR stretch /strap 10x5" Seated UT/LS stretches   [facility] Adult PT Treatment:                                                DATE: 04/24/2022 Therapeutic Exercise: Seated UT/LS stretches x20" each B + subjective intake Standing UBE warm up L4 Fwd/Bkwd x2 min each Shoulder shrugs (seated) 1#DB, slightly IR/abd 2x10 Seated L unilateral shoulder ER YTB 2x15 Standing unilateral straight arm abd RTB 2x10 Shoulder IR/ER isometric step outs RTB x10 each Wall clock YTB x 5 sets B --> modified quadruped  (discontinued d/t pain) Pulleys: flexion/scaption/ER x1 min each L Pendulum 3#AW: A/P, L/M, circles Manual Therapy: STM L shoulder girdle, subscap  Modalities: Moist Heat L shoulder girdle x10 min                                                 PATIENT EDUCATION: Education details: Progress HEP; morning shoulder warm-up Person educated: Patient Education method: Explanation, Demonstration, and Handouts Education comprehension: verbalized understanding and returned demonstration  HOME EXERCISE PROGRAM: Access Code: [email or web address]: [email or web address] Date: 05/02/2022 Prepared by: KHelane Gunther Exercises - Shoulder External Rotation and Scapular Retraction with Resistance  - 1 x daily - 7 x weekly - 2 sets - 10 reps - Shoulder W - External Rotation with Resistance  -  1 x daily - 7 x weekly - 2 sets - 10 reps - Standing Shoulder Horizontal Abduction with Resistance  - 1 x daily - 7 x weekly - 2 sets - 10 reps - Wall Clock with Theraband  - 1 x daily - 7 x weekly - 1 sets - 10 reps - Supine Scapular Protraction in Flexion with Dumbbells  - 1 x daily - 7 x weekly - 2 sets - 10 reps - Seated Upper Trapezius Stretch  - 1 x daily - 7 x weekly - 3 sets - 5 reps - 20-30 sec hold - Seated Levator Scapulae Stretch  - 1 x daily - 7 x weekly - 3 sets - 5 reps - 20-30 sec hold - Forearm Walks on Wall with Resistance Band  - 1 x daily - 7 x weekly - 3 sets - 10 reps - Seated Shoulder Shrugs  - 1 x daily - 7 x weekly - 3 sets - 10 reps - Seated Shoulder Shrug Circles AROM Forward  - 1 x daily - 7 x weekly - 3 sets - 10 reps - Standing 'L' Stretch at Counter  - 1 x daily - 7 x weekly - 3 sets - 10 reps  ASSESSMENT:  CLINICAL IMPRESSION:  Instruction on morning routine for shoulder warm-up to address stiffness in mornings. Frequent wrist extension compensation exhibited during unilateral shoulder ER/IR. Rotator cuff strengthening progressed with resisted shoulder ER/IR. Tactile cues  required to improve scapular retraction and decrease upper trapezius compensation during overhead shoulder extension.    OBJECTIVE IMPAIRMENTS: decreased activity tolerance, decreased ROM, decreased strength, impaired UE functional use, and pain.     GOALS: Goals reviewed with patient? Yes  SHORT TERM GOALS: Target date: 04/27/2022    Pt will be independent in initial HEP Baseline: Goal status: INITIAL 1/3: MET  LONG TERM GOALS: Target date: 05/25/2022    Pt will be independent in advanced HEP Baseline:  Goal status: INITIAL  2.  Pt will improve UE strength to 4+/5 to work and coach with decreased pain Baseline:  Goal status: INITIAL  3.  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  Pt will report working a full shift with pain <= 3/10 Baseline:  Goal status: INITIAL  PLAN:  PT FREQUENCY: 2x/week  PT DURATION: 6 weeks  PLANNED INTERVENTIONS: Therapeutic exercises, Therapeutic activity, Neuromuscular re-education, Balance training, Gait training, Patient/Family education, Self Care, Joint mobilization, Aquatic Therapy, Dry Needling, Electrical stimulation, Cryotherapy, Moist heat, Taping, Vasopneumatic device, Ultrasound, Ionotophoresis 56m/ml Dexamethasone, Manual therapy, and Re-evaluation  PLAN FOR NEXT SESSION: Challenge scapular stability, progress postural strength and endurance; moist heat as needed   KHardin Negus PTA 05/02/2022, 8:44 AM

## 2022-05-03 ENCOUNTER — Encounter: Payer: Self-pay | Admitting: Family Medicine

## 2022-05-03 ENCOUNTER — Ambulatory Visit: Payer: BC Managed Care – PPO | Admitting: Family Medicine

## 2022-05-03 VITALS — BP 128/65 | HR 76 | Temp 97.7°F | Ht 64.0 in | Wt 219.6 lb

## 2022-05-03 DIAGNOSIS — H66001 Acute suppurative otitis media without spontaneous rupture of ear drum, right ear: Secondary | ICD-10-CM | POA: Insufficient documentation

## 2022-05-03 DIAGNOSIS — I10 Essential (primary) hypertension: Secondary | ICD-10-CM | POA: Diagnosis not present

## 2022-05-03 DIAGNOSIS — G4733 Obstructive sleep apnea (adult) (pediatric): Secondary | ICD-10-CM | POA: Diagnosis not present

## 2022-05-03 MED ORDER — AMOXICILLIN-POT CLAVULANATE 875-125 MG PO TABS: 1.0000 | ORAL_TABLET | Freq: Two times a day (BID) | ORAL | 0 refills | Status: DC

## 2022-05-03 NOTE — Patient Instructions (Signed)
You were prescribed an antibiotic today. Please complete the full course.  Acetaminophen or ibuprofen for fever according to package instructions, do not exceed recommended doses.  Adequate fluids to avoid dehydration. Lots of rest while you are recovering.  Follow-up if your symptoms do not improve.     

## 2022-05-03 NOTE — Progress Notes (Signed)
   Acute Office Visit  Subjective:     Patient ID: Yolanda Mosley, female    DOB: 1980-09-22, 42 y.o.   MRN: [id number]  Chief Complaint  Patient presents with   Ear Pain    Recurrent right ear, started 3 days ago. She complains that pain has travelled down to her neck. She has used previously prescribed ear drops.     HPI Presents today for an acute visit with complaint of right ear pain radiating through right side of neck. Had same in November with resolution with ear drops.  Symptoms have been present for 3 days ago Associated symptoms include: pain on external ear  Pertinent negatives: no fever, chills, no ear drainage, no changes in hearing Pain severity: 4/10, 8/10 with touching Treatments tried include : used old drops yesterday morning Treatment effective : not effective Sick contacts : n/a Wears ear piece at work.  Chart review: 11/7: otitis externa, received Ciprodex.  12/22 labs: GFR 94  Review of Systems  Constitutional:  Negative for chills and fever.  HENT:  Positive for ear pain. Negative for congestion, ear discharge, hearing loss and sore throat.   Respiratory:  Negative for shortness of breath.   Cardiovascular:  Negative for chest pain.        Objective:    BP 128/65   Pulse 76   Temp 97.7 F (36.5 C) (Oral)   Ht '5\' 4"'$  (1.626 m)   Wt 219 lb 9.6 oz (99.6 kg)   SpO2 99%   BMI 37.69 kg/m    Physical Exam Vitals and nursing note reviewed.  Constitutional:      Appearance: Normal appearance.  HENT:     Right Ear: Tympanic membrane is erythematous and bulging.     Left Ear: Tympanic membrane normal.  Pulmonary:     Effort: Pulmonary effort is normal.     Breath sounds: Normal breath sounds.  Skin:    General: Skin is warm and dry.  Neurological:     Mental Status: She is alert.     No results found for any visits on 05/03/22.      Assessment & Plan:   Problem List Items Addressed This Visit     Non-recurrent acute suppurative otitis  media of right ear without spontaneous rupture of tympanic membrane - Primary   Relevant Medications   amoxicillin-clavulanate (AUGMENTIN) 875-125 MG tablet    Meds ordered this encounter  Medications   amoxicillin-clavulanate (AUGMENTIN) 875-125 MG tablet    Sig: Take 1 tablet by mouth 2 (two) times daily.    Dispense:  20 tablet    Refill:  0    Keflex caused diarrhea    Order Specific Question:   Supervising Provider    Answer:   Leeanne Rio [[id number]]  Treated for otitis externa on 11/7 with Ciprodex -her symptoms resolved.   Right TM erythematous and bulging with pain. No changes in hearing. No fever. No drainage.  Augmentin 875-125 mg BID x 10 days. Wears ear piece at work, will start alternating  from left to right to prevent future problems. No way to avoid wearing ear piece while working.  She will follow-up if symptoms recur, she may benefit from ENT referral if this occurs. Return if symptoms worsen or fail to improve.  Chalmers Guest, FNP

## 2022-05-04 ENCOUNTER — Ambulatory Visit: Payer: BC Managed Care – PPO | Admitting: Physical Therapy

## 2022-05-04 ENCOUNTER — Encounter: Payer: Self-pay | Admitting: Physical Therapy

## 2022-05-04 DIAGNOSIS — R29898 Other symptoms and signs involving the musculoskeletal system: Secondary | ICD-10-CM

## 2022-05-04 DIAGNOSIS — M25512 Pain in left shoulder: Secondary | ICD-10-CM | POA: Diagnosis not present

## 2022-05-04 DIAGNOSIS — M6281 Muscle weakness (generalized): Secondary | ICD-10-CM

## 2022-05-04 NOTE — Therapy (Signed)
OUTPATIENT PHYSICAL THERAPY TREATMENT   Patient Name: Yolanda Mosley MRN: [id number] DOB:1980-11-24, 42 y.o., female Today's Date: 05/04/2022  END OF SESSION:  PT End of Session - 05/04/22 0917     Visit Number 6    Number of Visits 12    Date for PT Re-Evaluation 05/25/22    PT Start Time 0834    PT Stop Time 4010    PT Time Calculation (min) 40 min    Activity Tolerance Patient tolerated treatment well    Behavior During Therapy [facility] for tasks assessed/performed               Past Medical History:  Diagnosis Date   ADHD 03/01/2011   ALLERGIC RHINITIS 01/09/2010   Qualifier: Diagnosis of  By: Madilyn Fireman MD, Catherine     Allergy    Anemia    Anemia, iron deficiency 09/09/2013   Asthma    Bilateral low back pain without sciatica 08/10/2014   CA - cancer 2005   Germ Cell Tumor - Rt Ovary   DDD (degenerative disc disease), lumbar 08/11/2014   L5 and S1 via xrays 07/2014.     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  CPAP 7 cm water pressure.  Mild OSA AHI 8.7.    Ovarian cyst    left   Peripheral edema 10/22/2016   Post chemo evaluation 04/04/2015    Sleep apnea    Tachycardia    Venous stasis dermatitis of both lower extremities 10/23/2016   Past Surgical History:  Procedure Laterality Date   LEFT HEART CATH AND CORONARY ANGIOGRAPHY N/A 07/14/2020   Procedure: LEFT HEART CATH AND CORONARY ANGIOGRAPHY;  Surgeon: Troy Sine, MD;  Location: [facility];  Service: Cardiovascular;  Laterality: N/A;   OMENTECTOMY  2005   ovary, bilater fallopian tubes removed  2005   SALPINGOOPHORECTOMY Right    Patient Active Problem List   Diagnosis Date Noted   Non-recurrent acute suppurative otitis media of right ear without spontaneous rupture of tympanic membrane 05/03/2022   Arthralgia of left acromioclavicular joint 04/10/2022   Tibialis posterior tendinitis, right 04/10/2022   Acute swimmer's ear of right side 03/06/2022   Insomnia 08/08/2021   Subclinical hypothyroidism 08/10/2020   Abnormal cardiac CT angiography    Chest pain of uncertain etiology 44/06/4740   Edema of right lower extremity 06/27/2020   LVH (left ventricular hypertrophy) 06/25/2020   Ovarian cyst    Essential hypertension    Herpes simplex virus (HSV) infection    GERD (gastroesophageal reflux disease)    Asthma    Anemia  Allergy    Mild mitral regurgitation by prior echocardiogram 11/14/2016   Venous stasis dermatitis of both lower extremities 10/23/2016   History of tachycardia 10/22/2016   Mild persistent asthma without complication 32/20/2542   Gastroesophageal reflux disease with esophagitis 08/16/2016   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   Post chemo evaluation 04/04/2015   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   DDD (degenerative disc disease), lumbar 08/11/2014   Muscle spasm of back 08/10/2014   Mid back pain 08/10/2014   Bilateral low back pain without sciatica 08/10/2014   History of ovarian cancer 01/20/2014   Gastric ulcer 09/14/2013   Anemia, iron deficiency 09/09/2013   Obesity 05/31/2011   ADHD 03/01/2011   OSA on CPAP  01/08/2011   EXTERNAL HEMORRHOIDS 01/09/2010   ALLERGIC RHINITIS 01/09/2010    PCP: Madilyn Fireman  REFERRING PROVIDER: Dianah Field  REFERRING DIAG: arthralgia of Lt acromioclavicular joint  THERAPY DIAG:  Acute pain of left shoulder  Muscle weakness (generalized)  Other symptoms and signs involving the musculoskeletal system  Rationale for Evaluation and Treatment: Rehabilitation  ONSET DATE: 2021  SUBJECTIVE:                                                                                                                                                                                      SUBJECTIVE STATEMENT:  Pt states that her shoulder and head have been feeling better. She states her shoulder is still the most sore in the mornings  PERTINENT HISTORY: From eval: 2-3 years ago pt was pulling shopping carts apart at her job at [employer] and felt a "pop" in her shoulder. She went to  urgent care and had to wear a sling but had no formal treatment. 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  Pain also increases with pushing up from the bed, pulling shopping carts. Pain eases with heat. Rt LE is constantly swollen and pt has had ankle pain starting in medial ankle sometimes going up to groin. She is no longer having pain but plans to follow up with MD about swelling.   PAIN:  Are you having pain? Yes: NPRS scale: 3-4/10 Pain location: Lt shoulder Pain description: achey, occasionally sharp Aggravating factors: lift, push/pull Relieving factors: heat  PRECAUTIONS: None  WEIGHT BEARING RESTRICTIONS: No  FALLS:  Has patient fallen in last 6 months? No  OCCUPATION: Freight forwarder at [employer], also coaches cheerleading  PLOF: Independent  PATIENT GOALS:reduce pain  NEXT MD VISIT:   OBJECTIVE:   UPPER EXTREMITY ROM:   Active ROM Right eval Left eval  Shoulder flexion  170 pain  Shoulder extension  [facility]  Shoulder  abduction  170  Shoulder adduction  pain  Shoulder internal rotation  80 - pain  Shoulder external rotation  80  Elbow flexion    Elbow extension    Wrist flexion    Wrist extension    Wrist ulnar deviation    Wrist radial deviation    Wrist pronation    Wrist supination    (Blank rows = not tested)  UPPER EXTREMITY MMT:  MMT Right eval Left eval  Shoulder flexion  4-/5pain  Shoulder extension  4-  Shoulder abduction  4  Shoulder adduction  4-/5 pain  Shoulder internal rotation    Shoulder external rotation    Middle trapezius    Lower trapezius    Elbow flexion  4  Elbow extension  4  Wrist flexion    Wrist extension    Wrist ulnar deviation    Wrist radial deviation    Wrist pronation    Wrist supination    Grip strength (lbs)    (Blank rows = not tested)  PALPATION:  TTP AC joint and along Lt clavical  [facility] Adult PT Treatment:                                                DATE: 05/04/22 Therapeutic Exercise: UBE L3 x 4 min alt fwd/bkwd Row blue TB 2 x 10 Shoulder extension blue TB 2 x 10 W red TB x 15 Serratus wall slide x 12 red TB Wall clock x 10 bilat red TB Ball circles on wall 30 sec each CW/CCW x 2 Wall push up x 10 Elevated plank on mat table with wt shifts 2 x 30 sec Shoulder press Lt UE 8# 2 x 10 90/90 carry 8# x 2 laps Manual Therapy: STM Lt triceps, deltoids, scapular mm to reduce spasticity and pain   [facility] Adult PT Treatment:                                                DATE: 05/02/2022 Therapeutic Exercise: Pulleys flex/scap/ER x2 min each Shoulder flexion counter stretch UBE fwd/bkwd 26mn each Seated shoulder shrugs x10 --> bkwd shoulder circles x10 Resisted shoulder extension GTB 2x10x3" Resisted rows BTB 2x10 Resisted Ws RTB x15 Seated resisted unilateral L ER YTB 2x10 Standing resisted L shoulder abd YTB 2x10 Resisted L shoulder IR YTB 2x10 Resisted L D2 flexion YTB 2x10 SA arm wall slides (strap for isometric abd activation)  x10 --> added lift off Standing L shoudler IR stretch /strap 10x5" Seated UT/LS stretches                                                PATIENT EDUCATION: Education details: Progress HEP; morning shoulder warm-up Person educated: Patient Education method: Explanation, Demonstration, and Handouts Education comprehension: verbalized understanding and returned demonstration  HOME EXERCISE PROGRAM: Access Code: [email or web address]: [email or web address] Date: 05/02/2022 Prepared by: KHelane Gunther Exercises - Shoulder External Rotation and Scapular Retraction with Resistance  - 1 x daily - 7 x weekly - 2 sets - 10 reps - Shoulder W -  External Rotation with Resistance  - 1 x daily - 7 x weekly - 2 sets - 10 reps - Standing Shoulder Horizontal Abduction with Resistance  - 1 x daily - 7 x weekly - 2 sets - 10 reps - Wall Clock with Theraband  - 1 x daily - 7 x weekly - 1 sets - 10 reps - Supine Scapular Protraction in Flexion with Dumbbells  - 1 x daily - 7 x weekly - 2 sets - 10 reps - Seated Upper Trapezius Stretch  - 1 x daily - 7 x weekly - 3 sets - 5 reps - 20-30 sec hold - Seated Levator Scapulae Stretch  - 1 x daily - 7 x weekly - 3 sets - 5 reps - 20-30 sec hold - Forearm Walks on Wall with Resistance Band  - 1 x daily - 7 x weekly - 3 sets - 10 reps - Seated Shoulder Shrugs  - 1 x daily - 7 x weekly - 3 sets - 10 reps - Seated Shoulder Shrug Circles AROM Forward  - 1 x daily - 7 x weekly - 3 sets - 10 reps - Standing 'L' Stretch at Counter  - 1 x daily - 7 x weekly - 3 sets - 10 reps  ASSESSMENT:  CLINICAL IMPRESSION:  Pt continues with soreness with all overhead activities. Good tolerance to wall push ups and elevated planks. Plan to add ball toss for dynamic shoulder strengthening   OBJECTIVE IMPAIRMENTS: decreased activity tolerance, decreased ROM, decreased strength, impaired UE functional use, and pain.     GOALS: Goals reviewed with patient? Yes  SHORT  TERM GOALS: Target date: 04/27/2022    Pt will be independent in initial HEP Baseline: Goal status: INITIAL 1/3: MET  LONG TERM GOALS: Target date: 05/25/2022    Pt will be independent in advanced HEP Baseline:  Goal status: INITIAL  2.  Pt will improve UE strength to 4+/5 to work and coach with decreased pain Baseline:  Goal status: INITIAL  3.  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  Pt will report working a full shift with pain <= 3/10 Baseline:  Goal status: INITIAL  PLAN:  PT FREQUENCY: 2x/week  PT DURATION: 6 weeks  PLANNED INTERVENTIONS: Therapeutic exercises, Therapeutic activity, Neuromuscular re-education, Balance training, Gait training, Patient/Family education, Self Care, Joint mobilization, Aquatic Therapy, Dry Needling, Electrical stimulation, Cryotherapy, Moist heat, Taping, Vasopneumatic device, Ultrasound, Ionotophoresis '4mg'$ /ml Dexamethasone, Manual therapy, and Re-evaluation  PLAN FOR NEXT SESSION: Challenge scapular stability, progress postural strength and endurance, ball toss   Sherrey North, PT 05/04/2022, 9:18 AM

## 2022-05-07 ENCOUNTER — Ambulatory Visit (INDEPENDENT_AMBULATORY_CARE_PROVIDER_SITE_OTHER): Payer: BC Managed Care – PPO

## 2022-05-07 DIAGNOSIS — N83202 Unspecified ovarian cyst, left side: Secondary | ICD-10-CM

## 2022-05-07 NOTE — Progress Notes (Signed)
S[place]hman, the cyst that was there previously is just a little bit larger.  And there is a new 1 on the left side that is sticking off of the ovary.  They really want [place] to get an MRI with contrast.  If you are okay with [place] moving forward then please let me know.

## 2022-05-08 ENCOUNTER — Other Ambulatory Visit: Payer: Self-pay | Admitting: *Deleted

## 2022-05-08 ENCOUNTER — Ambulatory Visit: Payer: BC Managed Care – PPO

## 2022-05-08 ENCOUNTER — Telehealth: Payer: Self-pay

## 2022-05-08 ENCOUNTER — Encounter: Payer: Self-pay | Admitting: Family Medicine

## 2022-05-08 DIAGNOSIS — N83202 Unspecified ovarian cyst, left side: Secondary | ICD-10-CM

## 2022-05-08 NOTE — Telephone Encounter (Signed)
Called patient and left voicemail following up on missed appt on 05/08/22 and reminder for appt on 05/10/22.  Helane Gunther, PTA 05/08/2022

## 2022-05-08 NOTE — Progress Notes (Signed)
MRI order placed.

## 2022-05-10 ENCOUNTER — Ambulatory Visit: Payer: BC Managed Care – PPO

## 2022-05-10 DIAGNOSIS — M6281 Muscle weakness (generalized): Secondary | ICD-10-CM

## 2022-05-10 DIAGNOSIS — M25512 Pain in left shoulder: Secondary | ICD-10-CM

## 2022-05-10 DIAGNOSIS — R29898 Other symptoms and signs involving the musculoskeletal system: Secondary | ICD-10-CM

## 2022-05-10 NOTE — Therapy (Signed)
OUTPATIENT PHYSICAL THERAPY TREATMENT   Patient Name: Yolanda Mosley MRN: [id number] DOB:09/18/1980, 42 y.o., female Today's Date: 05/10/2022  END OF SESSION:  PT End of Session - 05/10/22 0803     Visit Number 7    Number of Visits 12    Date for PT Re-Evaluation 05/25/22    PT Start Time 0802    PT Stop Time 4854    PT Time Calculation (min) 41 min    Activity Tolerance Patient tolerated treatment well    Behavior During Therapy [facility] for tasks assessed/performed               Past Medical History:  Diagnosis Date   ADHD 03/01/2011   ALLERGIC RHINITIS 01/09/2010   Qualifier: Diagnosis of  By: Madilyn Fireman MD, Catherine     Allergy    Anemia    Anemia, iron deficiency 09/09/2013   Asthma    Bilateral low back pain without sciatica 08/10/2014   CA - cancer 2005   Germ Cell Tumor - Rt Ovary   DDD (degenerative disc disease), lumbar 08/11/2014   L5 and S1 via xrays 07/2014.     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  CPAP 7 cm water pressure.  Mild OSA AHI 8.7.    Ovarian cyst    left   Peripheral edema 10/22/2016   Post chemo evaluation 04/04/2015    Sleep apnea    Tachycardia    Venous stasis dermatitis of both lower extremities 10/23/2016   Past Surgical History:  Procedure Laterality Date   LEFT HEART CATH AND CORONARY ANGIOGRAPHY N/A 07/14/2020   Procedure: LEFT HEART CATH AND CORONARY ANGIOGRAPHY;  Surgeon: Troy Sine, MD;  Location: [facility];  Service: Cardiovascular;  Laterality: N/A;   OMENTECTOMY  2005   ovary, bilater fallopian tubes removed  2005   SALPINGOOPHORECTOMY Right    Patient Active Problem List   Diagnosis Date Noted   Non-recurrent acute suppurative otitis media of right ear without spontaneous rupture of tympanic membrane 05/03/2022   Arthralgia of left acromioclavicular joint 04/10/2022   Tibialis posterior tendinitis, right 04/10/2022   Acute swimmer's ear of right side 03/06/2022   Insomnia 08/08/2021   Subclinical hypothyroidism 08/10/2020   Abnormal cardiac CT angiography    Chest pain of uncertain etiology 09/38/1829   Edema of right lower extremity 06/27/2020   LVH (left ventricular hypertrophy) 06/25/2020   Ovarian cyst    Essential hypertension    Herpes simplex virus (HSV) infection    GERD (gastroesophageal reflux disease)    Asthma    Anemia  Allergy    Mild mitral regurgitation by prior echocardiogram 11/14/2016   Venous stasis dermatitis of both lower extremities 10/23/2016   History of tachycardia 10/22/2016   Mild persistent asthma without complication 56/38/7564   Gastroesophageal reflux disease with esophagitis 08/16/2016   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   Post chemo evaluation 04/04/2015   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   DDD (degenerative disc disease), lumbar 08/11/2014   Muscle spasm of back 08/10/2014   Mid back pain 08/10/2014   Bilateral low back pain without sciatica 08/10/2014   History of ovarian cancer 01/20/2014   Gastric ulcer 09/14/2013   Anemia, iron deficiency 09/09/2013   Obesity 05/31/2011   ADHD 03/01/2011   OSA on CPAP  01/08/2011   EXTERNAL HEMORRHOIDS 01/09/2010   ALLERGIC RHINITIS 01/09/2010    PCP: Madilyn Fireman  REFERRING PROVIDER: Dianah Field  REFERRING DIAG: arthralgia of Lt acromioclavicular joint  THERAPY DIAG:  Acute pain of left shoulder  Muscle weakness (generalized)  Other symptoms and signs involving the musculoskeletal system  Rationale for Evaluation and Treatment: Rehabilitation  ONSET DATE: 2021  SUBJECTIVE:                                                                                                                                                                                      SUBJECTIVE STATEMENT:  Patient reports her shoulder continues to feel better, states she has been doing shoulder warm up exercises in morning which is helping soreness.  PERTINENT HISTORY: From eval: 2-3 years ago pt was pulling shopping carts apart at her job at [employer] and felt a "pop" in her shoulder. She went to  urgent care and had to wear a sling but had no formal treatment. 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  Pain also increases with pushing up from the bed, pulling shopping carts. Pain eases with heat. Rt LE is constantly swollen and pt has had ankle pain starting in medial ankle sometimes going up to groin. She is no longer having pain but plans to follow up with MD about swelling.   PAIN:  Are you having pain? Yes: NPRS scale: 2/10 Pain location: Lt shoulder Pain description: achey, occasionally sharp Aggravating factors: lift, push/pull Relieving factors: heat  PRECAUTIONS: None  WEIGHT BEARING RESTRICTIONS: No  FALLS:  Has patient fallen in last 6 months? No  OCCUPATION: Freight forwarder at [employer], also coaches cheerleading  PLOF: Independent  PATIENT GOALS:reduce pain  NEXT MD VISIT:   OBJECTIVE:   UPPER EXTREMITY ROM:   Active ROM Right eval Left eval  Shoulder flexion  170 pain  Shoulder extension  [facility]  Shoulder abduction  170  Shoulder adduction  pain  Shoulder internal rotation  80 - pain  Shoulder external rotation  80  Elbow flexion    Elbow extension    Wrist flexion    Wrist extension    Wrist ulnar deviation    Wrist radial deviation    Wrist pronation    Wrist supination    (Blank rows = not tested)  UPPER EXTREMITY MMT:  MMT Right eval Left eval  Shoulder flexion  4-/5pain  Shoulder extension  4-  Shoulder abduction  4  Shoulder adduction  4-/5 pain  Shoulder internal rotation    Shoulder external rotation    Middle trapezius    Lower trapezius    Elbow flexion  4  Elbow extension  4  Wrist flexion    Wrist extension    Wrist ulnar deviation    Wrist radial deviation    Wrist pronation    Wrist supination    Grip strength (lbs)    (Blank rows = not tested)  PALPATION:  TTP AC joint and along Lt clavical   [facility] Adult PT Treatment:                                                DATE: 05/10/2022 Therapeutic Exercise: Pulleys: flex/scap x 57mn each, ER x20 UBE L4 x 2 min fwd/ 233m bkwd W red TB x 15 T YTB x15 Hand ABCs w/small green ball (shoulder stabilization) Serratus wall slide x 15 red TB Wall push up x 10 each: tricep press, staggered hands, diamond Body blade abd 3x30" 4#MB toss/catch L overhead x1 min 2#MB overhead toss down (trampoline flat) Sap punches RTB x10 --> GTB x10 Manual Therapy: STM/TPR L sub scap, pec, UT   [facility] Adult PT Treatment:                                                DATE: 05/04/22 Therapeutic Exercise: UBE L3 x 4 min alt fwd/bkwd Row blue TB 2 x 10 Shoulder extension blue TB 2 x 10 W red TB x 15 Serratus wall slide x 12 red TB Wall clock x 10 bilat red TB Ball circles on wall 30 sec each CW/CCW x 2 Wall push up x 10 Elevated plank on mat table with wt shifts 2 x 30 sec Shoulder press Lt UE 8# 2 x 10 90/90 carry 8# x 2 laps Manual Therapy: STM Lt triceps, deltoids, scapular mm to reduce spasticity and  pain                                              PATIENT EDUCATION: Education details: Scap punches with theraband Person educated: Patient Education method: Explanation, Demonstration, and Handouts Education comprehension: verbalized understanding and returned demonstration  HOME EXERCISE PROGRAM: Access Code: [email or web address]: [email or web address] Date: 05/02/2022 Prepared by: KaHelane GuntherExercises - Shoulder External Rotation and Scapular Retraction with Resistance  - 1 x daily - 7 x weekly - 2 sets - 10 reps - Shoulder W - External Rotation with Resistance  - 1 x daily -  7 x weekly - 2 sets - 10 reps - Standing Shoulder Horizontal Abduction with Resistance  - 1 x daily - 7 x weekly - 2 sets - 10 reps - Wall Clock with Theraband  - 1 x daily - 7 x weekly - 1 sets - 10 reps - Supine Scapular Protraction in Flexion with Dumbbells  - 1 x daily - 7 x weekly - 2 sets - 10 reps - Seated Upper Trapezius Stretch  - 1 x daily - 7 x weekly - 3 sets - 5 reps - 20-30 sec hold - Seated Levator Scapulae Stretch  - 1 x daily - 7 x weekly - 3 sets - 5 reps - 20-30 sec hold - Forearm Walks on Wall with Resistance Band  - 1 x daily - 7 x weekly - 3 sets - 10 reps - Seated Shoulder Shrugs  - 1 x daily - 7 x weekly - 3 sets - 10 reps - Seated Shoulder Shrug Circles AROM Forward  - 1 x daily - 7 x weekly - 3 sets - 10 reps - Standing 'L' Stretch at Counter  - 1 x daily - 7 x weekly - 3 sets - 10 reps  ASSESSMENT:  CLINICAL IMPRESSION:  Weighted ball toss incorporated with focus on overhead action; patient reported mild fatigue in shoulder however no exacerbation of pain. Patient continues to demonstrate limited scapular mobility and fair shoulder stabilization. Resisted scapular protraction added to progress scapular mobility.   OBJECTIVE IMPAIRMENTS: decreased activity tolerance, decreased ROM, decreased strength, impaired UE functional use, and pain.     GOALS: Goals reviewed  with patient? Yes  SHORT TERM GOALS: Target date: 04/27/2022    Pt will be independent in initial HEP Baseline: Goal status: INITIAL 1/3: MET  LONG TERM GOALS: Target date: 05/25/2022    Pt will be independent in advanced HEP Baseline:  Goal status: INITIAL  2.  Pt will improve UE strength to 4+/5 to work and coach with decreased pain Baseline:  Goal status: INITIAL  3.  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  Pt will report working a full shift with pain <= 3/10 Baseline:  Goal status: INITIAL  PLAN:  PT FREQUENCY: 2x/week  PT DURATION: 6 weeks  PLANNED INTERVENTIONS: Therapeutic exercises, Therapeutic activity, Neuromuscular re-education, Balance training, Gait training, Patient/Family education, Self Care, Joint mobilization, Aquatic Therapy, Dry Needling, Electrical stimulation, Cryotherapy, Moist heat, Taping, Vasopneumatic device, Ultrasound, Ionotophoresis '4mg'$ /ml Dexamethasone, Manual therapy, and Re-evaluation  PLAN FOR NEXT SESSION: Challenge scapular stability, progress postural strength and endurance, ball toss   Hardin Negus, PTA 05/10/2022, 8:46 AM

## 2022-05-14 ENCOUNTER — Ambulatory Visit (INDEPENDENT_AMBULATORY_CARE_PROVIDER_SITE_OTHER): Payer: BC Managed Care – PPO

## 2022-05-14 ENCOUNTER — Ambulatory Visit: Payer: BC Managed Care – PPO

## 2022-05-14 DIAGNOSIS — M25512 Pain in left shoulder: Secondary | ICD-10-CM | POA: Diagnosis not present

## 2022-05-14 DIAGNOSIS — R29898 Other symptoms and signs involving the musculoskeletal system: Secondary | ICD-10-CM | POA: Diagnosis not present

## 2022-05-14 DIAGNOSIS — N83202 Unspecified ovarian cyst, left side: Secondary | ICD-10-CM

## 2022-05-14 DIAGNOSIS — M6281 Muscle weakness (generalized): Secondary | ICD-10-CM | POA: Diagnosis not present

## 2022-05-14 DIAGNOSIS — Z9079 Acquired absence of other genital organ(s): Secondary | ICD-10-CM | POA: Diagnosis not present

## 2022-05-14 DIAGNOSIS — N83292 Other ovarian cyst, left side: Secondary | ICD-10-CM | POA: Diagnosis not present

## 2022-05-14 MED ORDER — GADOBUTROL 1 MMOL/ML IV SOLN
10.0000 mL | Freq: Once | INTRAVENOUS | Status: AC | PRN
  Administered 2022-05-14: 10 mL via INTRAVENOUS

## 2022-05-14 NOTE — Therapy (Signed)
OUTPATIENT PHYSICAL THERAPY TREATMENT   Patient Name: Yolanda Mosley MRN: [id number] DOB:1980-06-28, 42 y.o., female Today's Date: 05/14/2022  END OF SESSION:  PT End of Session - 05/14/22 0804     Visit Number 8    Number of Visits 12    Date for PT Re-Evaluation 05/25/22    PT Start Time 0802    PT Stop Time 8938    PT Time Calculation (min) 36 min    Activity Tolerance Patient tolerated treatment well    Behavior During Therapy [facility] for tasks assessed/performed               Past Medical History:  Diagnosis Date   ADHD 03/01/2011   ALLERGIC RHINITIS 01/09/2010   Qualifier: Diagnosis of  By: Madilyn Fireman MD, Catherine     Allergy    Anemia    Anemia, iron deficiency 09/09/2013   Asthma    Bilateral low back pain without sciatica 08/10/2014   CA - cancer 2005   Germ Cell Tumor - Rt Ovary   DDD (degenerative disc disease), lumbar 08/11/2014   L5 and S1 via xrays 07/2014.     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  CPAP 7 cm water pressure.  Mild OSA AHI 8.7.    Ovarian cyst    left   Peripheral edema 10/22/2016   Post chemo evaluation 04/04/2015    Sleep apnea    Tachycardia    Venous stasis dermatitis of both lower extremities 10/23/2016   Past Surgical History:  Procedure Laterality Date   LEFT HEART CATH AND CORONARY ANGIOGRAPHY N/A 07/14/2020   Procedure: LEFT HEART CATH AND CORONARY ANGIOGRAPHY;  Surgeon: Troy Sine, MD;  Location: [facility];  Service: Cardiovascular;  Laterality: N/A;   OMENTECTOMY  2005   ovary, bilater fallopian tubes removed  2005   SALPINGOOPHORECTOMY Right    Patient Active Problem List   Diagnosis Date Noted   Non-recurrent acute suppurative otitis media of right ear without spontaneous rupture of tympanic membrane 05/03/2022   Arthralgia of left acromioclavicular joint 04/10/2022   Tibialis posterior tendinitis, right 04/10/2022   Acute swimmer's ear of right side 03/06/2022   Insomnia 08/08/2021   Subclinical hypothyroidism 08/10/2020   Abnormal cardiac CT angiography    Chest pain of uncertain etiology 25/85/2778   Edema of right lower extremity 06/27/2020   LVH (left ventricular hypertrophy) 06/25/2020   Ovarian cyst    Essential hypertension    Herpes simplex virus (HSV) infection    GERD (gastroesophageal reflux disease)    Asthma    Anemia  Allergy    Mild mitral regurgitation by prior echocardiogram 11/14/2016   Venous stasis dermatitis of both lower extremities 10/23/2016   History of tachycardia 10/22/2016   Mild persistent asthma without complication 16/01/9603   Gastroesophageal reflux disease with esophagitis 08/16/2016   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   Post chemo evaluation 04/04/2015   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   DDD (degenerative disc disease), lumbar 08/11/2014   Muscle spasm of back 08/10/2014   Mid back pain 08/10/2014   Bilateral low back pain without sciatica 08/10/2014   History of ovarian cancer 01/20/2014   Gastric ulcer 09/14/2013   Anemia, iron deficiency 09/09/2013   Obesity 05/31/2011   ADHD 03/01/2011   OSA on CPAP  01/08/2011   EXTERNAL HEMORRHOIDS 01/09/2010   ALLERGIC RHINITIS 01/09/2010    PCP: Madilyn Fireman  REFERRING PROVIDER: Dianah Field  REFERRING DIAG: arthralgia of Lt acromioclavicular joint  THERAPY DIAG:  Acute pain of left shoulder  Muscle weakness (generalized)  Other symptoms and signs involving the musculoskeletal system  Rationale for Evaluation and Treatment: Rehabilitation  ONSET DATE: 2021  SUBJECTIVE:                                                                                                                                                                                      SUBJECTIVE STATEMENT:  Patient reports her shoulder continues to feel better, states it stays at a level 2. Patient states she had mild soreness in shoulder after last visit.  PERTINENT HISTORY: From eval: 2-3 years ago pt was pulling shopping carts apart at her job at [employer] and felt a "pop" in her shoulder. She went to  urgent care and had to wear a sling but had no formal treatment. 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  Pain also increases with pushing up from the bed, pulling shopping carts. Pain eases with heat. Rt LE is constantly swollen and pt has had ankle pain starting in medial ankle sometimes going up to groin. She is no longer having pain but plans to follow up with MD about swelling.   PAIN:  Are you having pain? Yes: NPRS scale: 2/10 Pain location: Lt shoulder Pain description: achey, occasionally sharp Aggravating factors: lift, push/pull Relieving factors: heat  PRECAUTIONS: None  WEIGHT BEARING RESTRICTIONS: No  FALLS:  Has patient fallen in last 6 months? No  OCCUPATION: Freight forwarder at [employer], also coaches cheerleading  PLOF: Independent  PATIENT GOALS:reduce pain  NEXT MD VISIT:   OBJECTIVE:   UPPER EXTREMITY ROM:   Active ROM Right eval Left eval  Shoulder flexion  170 pain  Shoulder  extension  [facility]  Shoulder abduction  170  Shoulder adduction  pain  Shoulder internal rotation  80 - pain  Shoulder external rotation  80  Elbow flexion    Elbow extension    Wrist flexion    Wrist extension    Wrist ulnar deviation    Wrist radial deviation    Wrist pronation    Wrist supination    (Blank rows = not tested)  UPPER EXTREMITY MMT:  MMT Right eval Left eval  Shoulder flexion  4-/5pain  Shoulder extension  4-  Shoulder abduction  4  Shoulder adduction  4-/5 pain  Shoulder internal rotation    Shoulder external rotation    Middle trapezius    Lower trapezius    Elbow flexion  4  Elbow extension  4  Wrist flexion    Wrist extension    Wrist ulnar deviation    Wrist radial deviation    Wrist pronation    Wrist supination    Grip strength (lbs)    (Blank rows = not tested)  PALPATION:  TTP AC joint and along Lt clavical    [facility] Adult PT Treatment:                                                DATE: 05/14/2022 Therapeutic Exercise: UBE warm up L% 35mn Fwd/255m Bkwd Hand ABCs w/small green ball (shoulder stabilization) Walking with overhead static press 5#KB 2L Seated -->standing overhead press KB 5#KB x10 each bow & arrow RTB 2x10 W RTB 2x10 Scap punches RTB x10 Alt one arm high/one arm T YTB x10 L UE body blade various positions 2x1m61mDoorway pec stretch 3-positions x 30" each Overhead lat stretch in doorway 2x30" Walking with overhead press hold 6#MB 1L --> bending/straightening arm 6#MB 2L  11#MB squat toss/catch 3x10 11#MB toss/catch with therapist 11#MB slams 2x10   [facility]:                                                DATE: 05/10/2022 Therapeutic Exercise: Pulleys: flex/scap x 2mi80mach, ER x20 UBE L4 x 2 min fwd/ 2min73mwd W red TB x 15 T YTB x15 Hand ABCs w/small green ball (shoulder stabilization) Serratus wall slide x 15 red TB Wall push up x 10 each: tricep press, staggered hands, diamond Body blade abd  3x30" 4#MB toss/catch L overhead x1 min 2#MB overhead toss down (trampoline flat) Sap punches RTB x10 --> GTB x10 Manual Therapy: STM/TPR L sub scap, pec, UT                                              PATIENT EDUCATION: Education details: Scap punches with theraband Person educated: Patient Education method: Explanation, Demonstration, and Handouts Education comprehension: verbalized understanding and returned demonstration  HOME EXERCISE PROGRAM: Access Code: [email or web address] Date: 05/02/2022 Prepared by: KathrHelane Guntherrcises - Shoulder External Rotation and Scapular Retraction with Resistance  - 1 x daily - 7 x weekly - 2 sets - 10 reps - Shoulder W - External Rotation with Resistance  -  1 x daily - 7 x weekly - 2 sets - 10 reps - Standing Shoulder Horizontal Abduction with Resistance  - 1 x daily - 7 x weekly - 2 sets - 10 reps - Wall Clock with Theraband  - 1 x daily - 7 x weekly - 1 sets - 10 reps - Supine Scapular Protraction in Flexion with Dumbbells  - 1 x daily - 7 x weekly - 2 sets - 10 reps - Seated Upper Trapezius Stretch  - 1 x daily - 7 x weekly - 3 sets - 5 reps - 20-30 sec hold - Seated Levator Scapulae Stretch  - 1 x daily - 7 x weekly - 3 sets - 5 reps - 20-30 sec hold - Forearm Walks on Wall with Resistance Band  - 1 x daily - 7 x weekly - 3 sets - 10 reps - Seated Shoulder Shrugs  - 1 x daily - 7 x weekly - 3 sets - 10 reps - Seated Shoulder Shrug Circles AROM Forward  - 1 x daily - 7 x weekly - 3 sets - 10 reps - Standing 'L' Stretch at Counter  - 1 x daily - 7 x weekly - 3 sets - 10 reps  ASSESSMENT:  CLINICAL IMPRESSION:  Interventions focused on dynamic shoulder stabilization with added perturbations. Body blade incorporated to challenge shoulder stabilization and strength; patient demonstrated moderate fatigue in L shoulder after completing exercise. Patient demonstrated improved shoulder stabilization and increased  tolerance for overhead activities with no exacerbation of pain.  OBJECTIVE IMPAIRMENTS: decreased activity tolerance, decreased ROM, decreased strength, impaired UE functional use, and pain.     GOALS: Goals reviewed with patient? Yes  SHORT TERM GOALS: Target date: 04/27/2022   Pt will be independent in initial HEP Baseline: Goal status: INITIAL 1/3: MET  LONG TERM GOALS: Target date: 05/25/2022    Pt will be independent in advanced HEP Baseline:  Goal status: INITIAL  2.  Pt will improve UE strength to 4+/5 to work and coach with decreased pain Baseline:  Goal status: INITIAL  3.  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  Pt will report working a full shift with pain <= 3/10 Baseline:  Goal status: INITIAL  PLAN:  PT FREQUENCY: 2x/week  PT DURATION: 6 weeks  PLANNED INTERVENTIONS: Therapeutic exercises, Therapeutic activity, Neuromuscular re-education, Balance training, Gait training, Patient/Family education, Self Care, Joint mobilization, Aquatic Therapy, Dry Needling, Electrical stimulation, Cryotherapy, Moist heat, Taping, Vasopneumatic device, Ultrasound, Ionotophoresis '4mg'$ /ml Dexamethasone, Manual therapy, and Re-evaluation  PLAN FOR NEXT SESSION: Challenge scapular stability, progress postural strength and endurance, ball toss   Hardin Negus, PTA 05/14/2022, 8:41 AM

## 2022-05-15 ENCOUNTER — Ambulatory Visit (INDEPENDENT_AMBULATORY_CARE_PROVIDER_SITE_OTHER): Payer: BC Managed Care – PPO | Admitting: Family Medicine

## 2022-05-15 ENCOUNTER — Encounter: Payer: Self-pay | Admitting: Family Medicine

## 2022-05-15 ENCOUNTER — Other Ambulatory Visit: Payer: Self-pay

## 2022-05-15 VITALS — BP 127/73 | HR 86 | Ht 64.0 in | Wt 215.0 lb

## 2022-05-15 DIAGNOSIS — F902 Attention-deficit hyperactivity disorder, combined type: Secondary | ICD-10-CM | POA: Diagnosis not present

## 2022-05-15 DIAGNOSIS — N83202 Unspecified ovarian cyst, left side: Secondary | ICD-10-CM | POA: Diagnosis not present

## 2022-05-15 DIAGNOSIS — R0789 Other chest pain: Secondary | ICD-10-CM | POA: Diagnosis not present

## 2022-05-15 MED ORDER — LISDEXAMFETAMINE DIMESYLATE 70 MG PO CAPS: 70.0000 mg | ORAL_CAPSULE | Freq: Every day | ORAL | 0 refills | Status: DC

## 2022-05-15 MED ORDER — ALUM & MAG HYDROXIDE-SIMETH 200-200-20 MG/5ML PO SUSP
30.0000 mL | Freq: Once | ORAL | Status: AC
  Administered 2022-05-15: 30 mL via ORAL

## 2022-05-15 MED ORDER — HYOSCYAMINE SULFATE 0.125 MG PO TBDP
0.1250 mg | ORAL_TABLET | Freq: Once | ORAL | Status: AC
  Administered 2022-05-15: 0.125 mg via SUBLINGUAL

## 2022-05-15 MED ORDER — LIDOCAINE VISCOUS HCL 2 % MT SOLN
15.0000 mL | Freq: Once | OROMUCOSAL | Status: AC
  Administered 2022-05-15: 15 mL via OROMUCOSAL

## 2022-05-15 MED ORDER — ACYCLOVIR 400 MG PO TABS: 400.0000 mg | ORAL_TABLET | Freq: Three times a day (TID) | ORAL | 99 refills | Status: DC

## 2022-05-15 NOTE — Progress Notes (Signed)
Established Patient Office Visit  Subjective   Patient ID: Yolanda Mosley, female    DOB: Jan 23, 1981  Age: 42 y.o. MRN: [id number]  Chief Complaint  Patient presents with   Medication Refill    Vyvanse    HPI ADD - Reports symptoms are well controlled on current regime. Denies any problems with insomnia, chest pain, palpitations, or SOB.    She says this week is been really stressful.  Her niece who slept with her since birth and is now 23 was taken from her house by her sister.  So it has been particularly stressful.  Infection woke up at 1 AM with some midsternal chest pains.  It is waxed and waned in intensity but has not gone away.  She did try drinking a little bit of milk just to see if this helped.  She describes it as a tightness.  Still having the chronic right leg swelling and did go for her MRI scan yesterday but has not been notified of the results yet.  She still having some left shoulder pain but is going to physical therapy and it does seem to be helping.    ROS    Objective:     BP 127/73   Pulse 86   Ht '5\' 4"'$  (1.626 m)   Wt 215 lb (97.5 kg)   SpO2 100%   BMI 36.90 kg/m    Physical Exam Vitals and nursing note reviewed.  Constitutional:      Appearance: She is well-developed.  HENT:     Head: Normocephalic and atraumatic.  Cardiovascular:     Rate and Rhythm: Normal rate and regular rhythm.     Heart sounds: Normal heart sounds.  Pulmonary:     Effort: Pulmonary effort is normal.     Breath sounds: Normal breath sounds.  Skin:    General: Skin is warm and dry.  Neurological:     Mental Status: She is alert and oriented to person, place, and time.  Psychiatric:        Behavior: Behavior normal.      No results found for any visits on 05/15/22.    The 10-year ASCVD risk score (Arnett DK, et al., 2019) is: 0.8%    Assessment & Plan:   Problem List Items Addressed This Visit       Endocrine   Ovarian cyst    Reviewed MRI report.  It  is really more of a cluster of cysts.  But they did not recommend any further follow-up at this point.        Other   ADHD    Vyvanse refilled for 90-day supply.      Relevant Medications   lisdexamfetamine (VYVANSE) 70 MG capsule   lisdexamfetamine (VYVANSE) 70 MG capsule   lisdexamfetamine (VYVANSE) 70 MG capsule   Other Visit Diagnoses     Atypical chest pain    -  Primary   Relevant Medications   lidocaine (XYLOCAINE) 2 % viscous mouth solution 15 mL (Completed)   alum & mag hydroxide-simeth (MAALOX/MYLANTA) 200-200-20 MG/5ML suspension 30 mL (Completed)   hyoscyamine (ANASPAZ) disintergrating tablet 0.125 mg (Completed)   Other Relevant Orders   EKG 12-Lead      Went over MRI results for left ovarian cysts.   Atypical chest pain -unclear etiology I really think it might be more stress related she actually just had a cardiac cath done about 2 years ago and everything looked fantastic.  EKG was very reassuring today.  EKG  shows rate of 65 bpm, normal sinus rhythm with no acute ST-T wave changes.  Left ventricular hypertrophy.  We did give her a GI cocktail before she left today to see if that would help as well she wondered if some of this could also be GERD related.  She just had labs done a couple of weeks ago and everything looked reassuring.   Return in about 3 months (around 08/14/2022) for adhd.    Beatrice Lecher, MD

## 2022-05-16 ENCOUNTER — Telehealth: Payer: Self-pay | Admitting: Family Medicine

## 2022-05-16 NOTE — Telephone Encounter (Signed)
Please call patient and see if she is feeling better.  She was in the office yesterday and is experiencing chest pain EKG was normal and we actually gave her a GI cocktail before she left in hopes that it might improve her discomfort I like to see if she is feeling any better today.

## 2022-05-16 NOTE — Telephone Encounter (Signed)
Left detailed message requesting a return call with updates on current status.

## 2022-05-16 NOTE — Telephone Encounter (Signed)
Called pt and she stated that she feels some better the pain hasn't completely gone away however it is better than when she was here yesterday.

## 2022-05-16 NOTE — Assessment & Plan Note (Signed)
Vyvanse refilled for 90-day supply.

## 2022-05-16 NOTE — Progress Notes (Signed)
Reviewed results together during office visit.  No further workup needed.

## 2022-05-16 NOTE — Assessment & Plan Note (Signed)
Reviewed MRI report.  It is really more of a cluster of cysts.  But they did not recommend any further follow-up at this point.

## 2022-05-17 ENCOUNTER — Ambulatory Visit: Payer: BC Managed Care – PPO

## 2022-05-17 DIAGNOSIS — M6281 Muscle weakness (generalized): Secondary | ICD-10-CM | POA: Diagnosis not present

## 2022-05-17 DIAGNOSIS — M25512 Pain in left shoulder: Secondary | ICD-10-CM

## 2022-05-17 DIAGNOSIS — R29898 Other symptoms and signs involving the musculoskeletal system: Secondary | ICD-10-CM

## 2022-05-17 NOTE — Therapy (Signed)
OUTPATIENT PHYSICAL THERAPY TREATMENT   Patient Name: Yolanda Mosley MRN: [id number] DOB:06-Jul-1980, 42 y.o., female Today's Date: 05/17/2022  END OF SESSION:  PT End of Session - 05/17/22 0804     Visit Number 9    Number of Visits 12    Date for PT Re-Evaluation 05/25/22    PT Start Time 0802    PT Stop Time 5027    PT Time Calculation (min) 40 min    Activity Tolerance Patient tolerated treatment well    Behavior During Therapy [facility] for tasks assessed/performed               Past Medical History:  Diagnosis Date   ADHD 03/01/2011   ALLERGIC RHINITIS 01/09/2010   Qualifier: Diagnosis of  By: Madilyn Fireman MD, Catherine     Allergy    Anemia    Anemia, iron deficiency 09/09/2013   Asthma    Bilateral low back pain without sciatica 08/10/2014   CA - cancer 2005   Germ Cell Tumor - Rt Ovary   DDD (degenerative disc disease), lumbar 08/11/2014   L5 and S1 via xrays 07/2014.     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  CPAP 7 cm water pressure.  Mild OSA AHI 8.7.    Ovarian cyst    left   Peripheral edema 10/22/2016   Post chemo evaluation 04/04/2015    Sleep apnea    Tachycardia    Venous stasis dermatitis of both lower extremities 10/23/2016   Past Surgical History:  Procedure Laterality Date   LEFT HEART CATH AND CORONARY ANGIOGRAPHY N/A 07/14/2020   Procedure: LEFT HEART CATH AND CORONARY ANGIOGRAPHY;  Surgeon: Troy Sine, MD;  Location: [facility];  Service: Cardiovascular;  Laterality: N/A;   OMENTECTOMY  2005   ovary, bilater fallopian tubes removed  2005   SALPINGOOPHORECTOMY Right    Patient Active Problem List   Diagnosis Date Noted   Non-recurrent acute suppurative otitis media of right ear without spontaneous rupture of tympanic membrane 05/03/2022   Arthralgia of left acromioclavicular joint 04/10/2022   Tibialis posterior tendinitis, right 04/10/2022   Acute swimmer's ear of right side 03/06/2022   Insomnia 08/08/2021   Subclinical hypothyroidism 08/10/2020   Abnormal cardiac CT angiography    Chest pain of uncertain etiology 67/67/2094   Edema of right lower extremity 06/27/2020   LVH (left ventricular hypertrophy) 06/25/2020   Ovarian cyst    Essential hypertension    Herpes simplex virus (HSV) infection    GERD (gastroesophageal reflux disease)    Asthma    Anemia  Allergy    Mild mitral regurgitation by prior echocardiogram 11/14/2016   Venous stasis dermatitis of both lower extremities 10/23/2016   History of tachycardia 10/22/2016   Mild persistent asthma without complication 19/37/9024   Gastroesophageal reflux disease with esophagitis 08/16/2016   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   Post chemo evaluation 04/04/2015   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   DDD (degenerative disc disease), lumbar 08/11/2014   Muscle spasm of back 08/10/2014   Mid back pain 08/10/2014   Bilateral low back pain without sciatica 08/10/2014   History of ovarian cancer 01/20/2014   Gastric ulcer 09/14/2013   Anemia, iron deficiency 09/09/2013   Obesity 05/31/2011   ADHD 03/01/2011   OSA on CPAP  01/08/2011   EXTERNAL HEMORRHOIDS 01/09/2010   ALLERGIC RHINITIS 01/09/2010    PCP: Madilyn Fireman  REFERRING PROVIDER: Dianah Field  REFERRING DIAG: arthralgia of Lt acromioclavicular joint  THERAPY DIAG:  Acute pain of left shoulder  Muscle weakness (generalized)  Other symptoms and signs involving the musculoskeletal system  Rationale for Evaluation and Treatment: Rehabilitation  ONSET DATE: 2021  SUBJECTIVE:                                                                                                                                                                                      SUBJECTIVE STATEMENT:  Patient report when she first wakes up her shoulder has 3/10 pain and stiffness that decreases to 0/10 as day progresses and she doesn't lift heavy objects.  PERTINENT HISTORY: From eval: 2-3 years ago pt was pulling shopping carts apart at her job at [employer] and felt a "pop" in her shoulder. She went to  urgent care and had to wear a sling but had no formal treatment. 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  Pain also increases with pushing up from the bed, pulling shopping carts. Pain eases with heat. Rt LE is constantly swollen and pt has had ankle pain starting in medial ankle sometimes going up to groin. She is no longer having pain but plans to follow up with MD about swelling.   PAIN:  Are you having pain? Yes: NPRS scale: 3/10 Pain location: Lt shoulder Pain description: achey, occasionally sharp Aggravating factors: lift, push/pull Relieving factors: heat  PRECAUTIONS: None  WEIGHT BEARING RESTRICTIONS: No  FALLS:  Has patient fallen in last 6 months? No  OCCUPATION: Freight forwarder at [employer], also coaches cheerleading  PLOF: Independent  PATIENT GOALS:reduce pain  NEXT MD VISIT:   OBJECTIVE:   UPPER EXTREMITY ROM:   Active ROM Right eval Left eval  Shoulder flexion  170 pain  Shoulder  extension  [facility]  Shoulder abduction  170  Shoulder adduction  pain  Shoulder internal rotation  80 - pain  Shoulder external rotation  80  Elbow flexion    Elbow extension    Wrist flexion    Wrist extension    Wrist ulnar deviation    Wrist radial deviation    Wrist pronation    Wrist supination    (Blank rows = not tested)  UPPER EXTREMITY MMT:  MMT Right eval Left eval  Shoulder flexion  4-/5pain  Shoulder extension  4-  Shoulder abduction  4  Shoulder adduction  4-/5 pain  Shoulder internal rotation    Shoulder external rotation    Middle trapezius    Lower trapezius    Elbow flexion  4  Elbow extension  4  Wrist flexion    Wrist extension    Wrist ulnar deviation    Wrist radial deviation    Wrist pronation    Wrist supination    Grip strength (lbs)    (Blank rows = not tested)  PALPATION:  TTP AC joint and along Lt clavical   [facility] Adult PT Treatment:                                                DATE: 05/17/2022 Therapeutic Exercise: Arm pulleys flex/scap/ER warm-up Seated UT/LS 3x30" 3x30" each Hand ABCs w/small green ball (shoulder stabilization) Fwd/Bkwd walking + 6#MB dynamic waiter's carry 3#MB overhead toss/catch on trampoline (L) 6#MB --> 11#MB squat toss/catch Doorway pec stretch 3x30" 5#KB --> 10#KB seated overhead press  Walking + 10#KB overhead press hold 2L Manual Therapy: ProneSTM/TPR UT/MT/PT   [facility] Adult PT Treatment:                                                DATE: 05/14/2022 Therapeutic Exercise: UBE warm up L% 43mn Fwd/252m Bkwd Hand ABCs w/small green ball (shoulder stabilization) Walking with overhead static press 5#KB 2L Seated -->standing overhead press KB 5#KB x10 each bow & arrow RTB 2x10 W RTB 2x10 Scap punches RTB x10 Alt one arm high/one arm T YTB x10 L UE body blade various positions 2x1m67mDoorway pec stretch 3-positions x 30" each Overhead lat stretch in doorway 2x30" Walking with overhead press hold 6#MB 1L  --> bending/straightening arm 6#MB 2L  11#MB squat toss/catch 3x10 11#MB toss/catch with therapist 11#MB slams 2x10                                              PATIENT EDUCATION: Education details: Dry needling info Person educated: Patient Education method: Explanation, Demonstration, and Handouts Education comprehension: verbalized understanding and returned demonstration  HOME EXERCISE PROGRAM: Access Code: [email or web address]: [email or web address] Date: 05/02/2022 Prepared by: KatHelane Guntherxercises - Shoulder External Rotation and Scapular Retraction with Resistance  - 1 x daily - 7 x weekly - 2 sets - 10 reps - Shoulder W - External Rotation with Resistance  - 1 x daily - 7 x weekly - 2 sets - 10 reps - Standing Shoulder Horizontal Abduction with Resistance  - 1  x daily - 7 x weekly - 2 sets - 10 reps - Wall Clock with Theraband  - 1 x daily - 7 x weekly - 1 sets - 10 reps - Supine Scapular Protraction in Flexion with Dumbbells  - 1 x daily - 7 x weekly - 2 sets - 10 reps - Seated Upper Trapezius Stretch  - 1 x daily - 7 x weekly - 3 sets - 5 reps - 20-30 sec hold - Seated Levator Scapulae Stretch  - 1 x daily - 7 x weekly - 3 sets - 5 reps - 20-30 sec hold - Forearm Walks on Wall with Resistance Band  - 1 x daily - 7 x weekly - 3 sets - 10 reps - Seated Shoulder Shrugs  - 1 x daily - 7 x weekly - 3 sets - 10 reps - Seated Shoulder Shrug Circles AROM Forward  - 1 x daily - 7 x weekly - 3 sets - 10 reps - Standing 'L' Stretch at Counter  - 1 x daily - 7 x weekly - 3 sets - 10 reps  ASSESSMENT:  CLINICAL IMPRESSION:  STM/TPR performed to address trigger points in trapezius group; greater tension noted on L, especially in upper trapezius. Discussion with patient on dry needling treatment to address trigger points. Dynamic shoulder stabilization exercises continued with focus on weighted all over head throws to mimic cheerleading-related activities.  OBJECTIVE  IMPAIRMENTS: decreased activity tolerance, decreased ROM, decreased strength, impaired UE functional use, and pain.     GOALS: Goals reviewed with patient? Yes  SHORT TERM GOALS: Target date: 04/27/2022   Pt will be independent in initial HEP Baseline: Goal status: INITIAL 1/3: MET  LONG TERM GOALS: Target date: 05/25/2022    Pt will be independent in advanced HEP Baseline:  Goal status: INITIAL  2.  Pt will improve UE strength to 4+/5 to work and coach with decreased pain Baseline:  Goal status: INITIAL  3.  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  Pt will report working a full shift with pain <= 3/10 Baseline:  Goal status: INITIAL  PLAN:  PT FREQUENCY: 2x/week  PT DURATION: 6 weeks  PLANNED INTERVENTIONS: Therapeutic exercises, Therapeutic activity, Neuromuscular re-education, Balance training, Gait training, Patient/Family education, Self Care, Joint mobilization, Aquatic Therapy, Dry Needling, Electrical stimulation, Cryotherapy, Moist heat, Taping, Vasopneumatic device, Ultrasound, Ionotophoresis '4mg'$ /ml Dexamethasone, Manual therapy, and Re-evaluation  PLAN FOR NEXT SESSION: Dry needling? Challenge scapular stability, progress postural strength and endurance, ball toss, overhead press   Hardin Negus, PTA 05/17/2022, 8:42 AM

## 2022-05-22 ENCOUNTER — Encounter: Payer: BC Managed Care – PPO | Admitting: Physical Therapy

## 2022-05-22 ENCOUNTER — Ambulatory Visit: Payer: BC Managed Care – PPO | Admitting: Sports Medicine

## 2022-05-24 ENCOUNTER — Encounter: Payer: Self-pay | Admitting: Sports Medicine

## 2022-05-24 ENCOUNTER — Ambulatory Visit: Payer: BC Managed Care – PPO

## 2022-05-24 ENCOUNTER — Ambulatory Visit: Payer: BC Managed Care – PPO | Admitting: Sports Medicine

## 2022-05-24 DIAGNOSIS — G8929 Other chronic pain: Secondary | ICD-10-CM | POA: Diagnosis not present

## 2022-05-24 DIAGNOSIS — M25512 Pain in left shoulder: Secondary | ICD-10-CM

## 2022-05-24 DIAGNOSIS — M6281 Muscle weakness (generalized): Secondary | ICD-10-CM

## 2022-05-24 DIAGNOSIS — M76821 Posterior tibial tendinitis, right leg: Secondary | ICD-10-CM

## 2022-05-24 DIAGNOSIS — R29898 Other symptoms and signs involving the musculoskeletal system: Secondary | ICD-10-CM

## 2022-05-24 DIAGNOSIS — R600 Localized edema: Secondary | ICD-10-CM | POA: Diagnosis not present

## 2022-05-24 MED ORDER — PREDNISONE 50 MG PO TABS: ORAL_TABLET | ORAL | 0 refills | Status: DC

## 2022-05-24 MED ORDER — CYCLOBENZAPRINE HCL 10 MG PO TABS: 5.0000 mg | ORAL_TABLET | Freq: Every day | ORAL | 0 refills | Status: DC

## 2022-05-24 NOTE — Assessment & Plan Note (Signed)
Works at [employer], spends a lot of time on her feet, diagnosed her with tibialis posterior tendinitis at the last visit, she did have excessive pronation and a positive to many toes sign on the right. We gave her some walking restrictions, meloxicam, formal PT and referral for custom orthotics, she returns today doing better.

## 2022-05-24 NOTE — Assessment & Plan Note (Signed)
Resolved with conservative treatment though she is having some periscapular pain now.

## 2022-05-24 NOTE — Addendum Note (Signed)
Addended by: Silverio Decamp on: 05/24/2022 11:28 AM   Modules accepted: Orders

## 2022-05-24 NOTE — Progress Notes (Addendum)
    Procedures performed today:    None.  Independent interpretation of notes and tests performed by another provider:   None.  Brief History, Exam, Impression, and Recommendations:    Tibialis posterior tendinitis, right Works at [employer], spends a lot of time on her feet, diagnosed her with tibialis posterior tendinitis at the last visit, she did have excessive pronation and a positive to many toes sign on the right. We gave her some walking restrictions, meloxicam, formal PT and referral for custom orthotics, she returns today doing better.  Arthralgia of left acromioclavicular joint Resolved with conservative treatment though she is having some periscapular pain now.  Chronic periscapular pain on left side Has been doing a lot of heavy lifting and pushing and pulling at work, having increasing pain left periscapular region with some numbness over the upper arm. I do suspect this is likely radicular in nature. Shoulder motion is good with negative impingement signs on exam, she also has no tenderness over the acromioclavicular joint anymore. We will add prednisone, Flexeril at night, home conditioning, return to see me in 6 weeks, x-rays and potentially MRI if not better.  Edema of right lower extremity Roseann does continue to have swelling of the entire right lower extremity from the groin down, she had a DVT ultrasound that showed nonpathologically enlarged lymph nodes in the groin, ultimately she saw her PCP, had a pelvic MRI that did not show any evidence of vascular disease or pathologically enlarged lymph nodes. She had a DVT ultrasound with no evidence of DVT. I am unclear as to what causing her lower extremity edema though I suspect there may be some venous insufficiency, I would like vascular surgery to weigh in.  Chronic process with exacerbation and pharmacologic intervention  ____________________________________________ Gwen Her. Dianah Field, M.D., ABFM., CAQSM.,  AME. Primary Care and Sports Medicine  [facility]  Adjunct Professor of [employer] [facility] [employer]  Risk manager

## 2022-05-24 NOTE — Assessment & Plan Note (Signed)
Has been doing a lot of heavy lifting and pushing and pulling at work, having increasing pain left periscapular region with some numbness over the upper arm. I do suspect this is likely radicular in nature. Shoulder motion is good with negative impingement signs on exam, she also has no tenderness over the acromioclavicular joint anymore. We will add prednisone, Flexeril at night, home conditioning, return to see me in 6 weeks, x-rays and potentially MRI if not better.

## 2022-05-24 NOTE — Therapy (Signed)
OUTPATIENT PHYSICAL THERAPY TREATMENT   Patient Name: Yolanda Mosley MRN: [id number] DOB:11/26/1980, 42 y.o., female Today's Date: 05/24/2022  END OF SESSION:  PT End of Session - 05/24/22 1449     Visit Number 10    Number of Visits 12    Date for PT Re-Evaluation 05/25/22    PT Start Time 1950    PT Stop Time 1524    PT Time Calculation (min) 35 min               Past Medical History:  Diagnosis Date   ADHD 03/01/2011   ALLERGIC RHINITIS 01/09/2010   Qualifier: Diagnosis of  By: Madilyn Fireman MD, Catherine     Allergy    Anemia    Anemia, iron deficiency 09/09/2013   Asthma    Bilateral low back pain without sciatica 08/10/2014   CA - cancer 2005   Germ Cell Tumor - Rt Ovary   DDD (degenerative disc disease), lumbar 08/11/2014   L5 and S1 via xrays 07/2014.     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  CPAP 7 cm water pressure.  Mild OSA AHI 8.7.    Ovarian cyst    left   Peripheral edema 10/22/2016   Post chemo evaluation 04/04/2015   Sleep apnea    Tachycardia    Venous stasis dermatitis of both lower extremities 10/23/2016   Past Surgical  History:  Procedure Laterality Date   LEFT HEART CATH AND CORONARY ANGIOGRAPHY N/A 07/14/2020   Procedure: LEFT HEART CATH AND CORONARY ANGIOGRAPHY;  Surgeon: Troy Sine, MD;  Location: [facility];  Service: Cardiovascular;  Laterality: N/A;   OMENTECTOMY  2005   ovary, bilater fallopian tubes removed  2005   SALPINGOOPHORECTOMY Right    Patient Active Problem List   Diagnosis Date Noted   Chronic periscapular pain on left side 05/24/2022   Non-recurrent acute suppurative otitis media of right ear without spontaneous rupture of tympanic membrane 05/03/2022   Arthralgia of left acromioclavicular joint 04/10/2022   Tibialis posterior tendinitis, right 04/10/2022   Acute swimmer's ear of right side 03/06/2022   Insomnia 08/08/2021   Subclinical hypothyroidism 08/10/2020   Abnormal cardiac CT angiography    Chest pain of uncertain etiology 45/80/9983   Edema of right lower extremity 06/27/2020   LVH (left ventricular hypertrophy) 06/25/2020   Ovarian cyst    Essential hypertension    Herpes simplex virus (HSV) infection    GERD (gastroesophageal reflux disease)    Asthma    Anemia    Allergy    Mild mitral regurgitation by  prior echocardiogram 11/14/2016   Venous stasis dermatitis of both lower extremities 10/23/2016   History of tachycardia 10/22/2016   Mild persistent asthma without complication 95/62/1308   Gastroesophageal reflux disease with esophagitis 08/16/2016   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   Post chemo evaluation 04/04/2015   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   DDD (degenerative disc disease), lumbar 08/11/2014   Muscle spasm of back 08/10/2014   Mid back pain 08/10/2014   Bilateral low back pain without sciatica 08/10/2014   History of ovarian cancer 01/20/2014   Gastric ulcer 09/14/2013   Anemia, iron deficiency 09/09/2013   Obesity 05/31/2011   ADHD 03/01/2011   OSA on CPAP 01/08/2011   EXTERNAL HEMORRHOIDS 01/09/2010   ALLERGIC  RHINITIS 01/09/2010    PCP: Madilyn Fireman  REFERRING PROVIDER: Dianah Field  REFERRING DIAG: arthralgia of Lt acromioclavicular joint  THERAPY DIAG:  Acute pain of left shoulder  Muscle weakness (generalized)  Other symptoms and signs involving the musculoskeletal system  Rationale for Evaluation and Treatment: Rehabilitation  ONSET DATE: 2021  SUBJECTIVE:                                                                                                                                                                                      SUBJECTIVE STATEMENT:  Patient reports on Tuesday she moved some heavy bags at work and has been very sore since then. Patient states she had a friend massage her shoulder and it felt better but she was sore. Patient states no significant change in shoulder pain, states it is worse when lowering arm versus raising it.   PERTINENT HISTORY: From eval: 2-3 years ago pt was pulling shopping carts apart at her job at [employer] and felt a "pop" in her shoulder. She went to  urgent care and had to wear a sling but had no formal treatment. 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  Pain also increases with pushing up from the bed, pulling shopping carts. Pain eases with heat. Rt LE is constantly swollen and pt has had ankle pain starting in medial ankle sometimes going up to groin. She is no longer having pain but plans to follow up with MD about swelling.   PAIN:  Are you having pain? Yes: NPRS scale: 5/10 Pain location: Lt shoulder Pain description: achey, occasionally sharp Aggravating factors: lift, push/pull Relieving factors: heat  PRECAUTIONS: None  WEIGHT BEARING RESTRICTIONS: No  FALLS:  Has patient fallen in last 6 months? No  OCCUPATION: Freight forwarder at [employer], also coaches cheerleading  PLOF: [place] pain  NEXT MD VISIT:   OBJECTIVE:  UPPER EXTREMITY  ROM:   Active ROM Right eval Left eval Left 1/25  Shoulder flexion  170 pain 172 (mild pain on descend)  Shoulder extension  [facility]  Shoulder abduction  170 170  Shoulder adduction  pain   Shoulder internal rotation  80 - pain 80   Shoulder external rotation  80 90  Elbow flexion     Elbow extension     Wrist flexion     Wrist extension     Wrist ulnar deviation     Wrist radial deviation     Wrist pronation     Wrist supination     (Blank rows = not tested)  UPPER EXTREMITY MMT:  MMT Right eval Left eval Left  1/25  Shoulder flexion  4-/5pain 4-/5 mild pain  Shoulder extension  4-   Shoulder abduction  4 4-/5 mild pain  Shoulder adduction  4-/5 pain 4-/5 mild pain  Shoulder internal rotation     Shoulder external rotation     Middle trapezius     Lower trapezius     Elbow flexion  4 4+  Elbow extension  4 4+  Wrist flexion     Wrist extension     Wrist ulnar deviation     Wrist radial deviation     Wrist pronation     Wrist supination     Grip strength (lbs)     (Blank rows = not tested)  PALPATION:  TTP AC joint and along Lt clavical    [facility] Adult PT Treatment:                                                DATE: 05/24/2022 Therapeutic Exercise: Shoulder/Elbow MMT Shoulder ROM measurements 20#KB overhead raise 11#MB overhead throw/squat catch Fwd amb + 11#MB overhead hold 1L --> added lower/lift 1L Modalities: Moist heat on L shoulder during subjective intake x 68mn   [facility] Adult PT Treatment:                                                DATE: 05/17/2022 Therapeutic Exercise: Arm pulleys flex/scap/ER warm-up Seated UT/LS 3x30" 3x30" each Hand ABCs w/small green ball (shoulder stabilization) Fwd/Bkwd walking + 6#MB dynamic waiter's carry 3#MB overhead toss/catch on trampoline (L) 6#MB --> 11#MB squat toss/catch Doorway pec stretch 3x30" 5#KB --> 10#KB seated overhead press  Walking + 10#KB overhead press hold 2L Manual Therapy: ProneSTM/TPR  UT/MT/PT    PATIENT EDUCATION: Education details: Dry needling info Person educated: Patient Education method: Explanation, Demonstration, and Handouts Education comprehension: verbalized understanding and returned demonstration  HOME EXERCISE PROGRAM: Access Code: [email or web address]: [email or web address] Date: 05/02/2022 Prepared by: KHelane Gunther Exercises - Shoulder External Rotation and Scapular Retraction with Resistance  - 1 x daily - 7 x weekly - 2 sets - 10 reps - Shoulder W - External Rotation with Resistance  - 1 x daily - 7 x weekly - 2 sets - 10 reps - Standing Shoulder Horizontal Abduction with Resistance  - 1 x daily - 7 x weekly - 2 sets - 10 reps - Wall Clock with Theraband  - 1 x daily - 7 x weekly - 1 sets - 10 reps - Supine Scapular  Protraction in Flexion with Dumbbells  - 1 x daily - 7 x weekly - 2 sets - 10 reps - Seated Upper Trapezius Stretch  - 1 x daily - 7 x weekly - 3 sets - 5 reps - 20-30 sec hold - Seated Levator Scapulae Stretch  - 1 x daily - 7 x weekly - 3 sets - 5 reps - 20-30 sec hold - Forearm Walks on Wall with Resistance Band  - 1 x daily - 7 x weekly - 3 sets - 10 reps - Seated Shoulder Shrugs  - 1 x daily - 7 x weekly - 3 sets - 10 reps - Seated Shoulder Shrug Circles AROM Forward  - 1 x daily - 7 x weekly - 3 sets - 10 reps - Standing 'L' Stretch at Counter  - 1 x daily - 7 x weekly - 3 sets - 10 reps  ASSESSMENT:  CLINICAL IMPRESSION:  Shoulder ROM measured with all directions within normal range; mild pain reported at end range with shoulder flexion. Mild pain reported during eccentric phase of shoulder flexion and abduction. Discussion with patient on body mechanics to decrease shock force with straight arm catches for cheerleading coaching duties. Patient will continue to benefit from skilled therapy to address ongoing pain symptoms and improve shoulder stability in preparation for upcoming cheerleading coaching  duties.  OBJECTIVE IMPAIRMENTS: decreased activity tolerance, decreased ROM, decreased strength, impaired UE functional use, and pain.     GOALS: Goals reviewed with patient? Yes  SHORT TERM GOALS: Target date: 04/27/2022   Pt will be independent in initial HEP Baseline: Goal status: INITIAL 1/3: MET  LONG TERM GOALS: Target date: 05/25/2022    Pt will be independent in advanced HEP Baseline:  Goal status: INITIAL 1/25: IN PROGRESS  2.  Pt will improve UE strength to 4+/5 to work and coach with decreased pain Baseline:  Goal status: INITIAL 1/25: MET  3.  Pt will tolerate lifting 70# overhead with bilat UE to return to cheerleading coaching Baseline:  Goal status: INITIAL 1/25: IN PROGRESS  4.  Pt will report working a full shift with pain <= 3/10 Baseline:  Goal status: INITIAL 1/25: MET (2/10)  PLAN:  PT FREQUENCY: 2x/week  PT DURATION: 6 weeks  PLANNED INTERVENTIONS: Therapeutic exercises, Therapeutic activity, Neuromuscular re-education, Balance training, Gait training, Patient/Family education, Self Care, Joint mobilization, Aquatic Therapy, Dry Needling, Electrical stimulation, Cryotherapy, Moist heat, Taping, Vasopneumatic device, Ultrasound, Ionotophoresis '4mg'$ /ml Dexamethasone, Manual therapy, and Re-evaluation  PLAN FOR NEXT SESSION: Dry needling next visit; Challenge scapular stability, progress postural strength and endurance, ball toss, overhead press   Hardin Negus, PTA 05/24/2022, 3:25 PM

## 2022-05-24 NOTE — Assessment & Plan Note (Signed)
Yolanda Mosley does continue to have swelling of the entire right lower extremity from the groin down, she had a DVT ultrasound that showed nonpathologically enlarged lymph nodes in the groin, ultimately she saw her PCP, had a pelvic MRI that did not show any evidence of vascular disease or pathologically enlarged lymph nodes. She had a DVT ultrasound with no evidence of DVT. I am unclear as to what causing her lower extremity edema though I suspect there may be some venous insufficiency, I would like vascular surgery to weigh in.

## 2022-05-29 ENCOUNTER — Ambulatory Visit: Payer: BC Managed Care – PPO | Admitting: Rehabilitative and Restorative Service Providers"

## 2022-05-29 ENCOUNTER — Encounter: Payer: Self-pay | Admitting: Rehabilitative and Restorative Service Providers"

## 2022-05-29 DIAGNOSIS — R29898 Other symptoms and signs involving the musculoskeletal system: Secondary | ICD-10-CM

## 2022-05-29 DIAGNOSIS — M25512 Pain in left shoulder: Secondary | ICD-10-CM | POA: Diagnosis not present

## 2022-05-29 DIAGNOSIS — M6281 Muscle weakness (generalized): Secondary | ICD-10-CM

## 2022-05-29 NOTE — Therapy (Signed)
OUTPATIENT PHYSICAL THERAPY TREATMENT   Patient Name: Yolanda Mosley MRN: [id number] DOB:1980-06-07, 42 y.o., female Today's Date: 05/29/2022  END OF SESSION:  PT End of Session - 05/29/22 1106     Visit Number 11    Number of Visits 30    Date for PT Re-Evaluation 07/24/22    PT Start Time 1105    PT Stop Time 1153    PT Time Calculation (min) 48 min    Activity Tolerance Patient tolerated treatment well               Past Medical History:  Diagnosis Date   ADHD 03/01/2011   ALLERGIC RHINITIS 01/09/2010   Qualifier: Diagnosis of  By: Madilyn Fireman MD, Catherine     Allergy    Anemia    Anemia, iron deficiency 09/09/2013   Asthma    Bilateral low back pain without sciatica 08/10/2014   CA - cancer 2005   Germ Cell Tumor - Rt Ovary   DDD (degenerative disc disease), lumbar 08/11/2014   L5 and S1 via xrays 07/2014.     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  CPAP 7 cm water pressure.  Mild OSA AHI 8.7.    Ovarian cyst    left   Peripheral edema 10/22/2016   Post chemo evaluation 04/04/2015   Sleep apnea    Tachycardia    Venous stasis dermatitis  of both lower extremities 10/23/2016   Past Surgical History:  Procedure Laterality Date   LEFT HEART CATH AND CORONARY ANGIOGRAPHY N/A 07/14/2020   Procedure: LEFT HEART CATH AND CORONARY ANGIOGRAPHY;  Surgeon: Troy Sine, MD;  Location: [facility];  Service: Cardiovascular;  Laterality: N/A;   OMENTECTOMY  2005   ovary, bilater fallopian tubes removed  2005   SALPINGOOPHORECTOMY Right    Patient Active Problem List   Diagnosis Date Noted   Chronic periscapular pain on left side 05/24/2022   Non-recurrent acute suppurative otitis media of right ear without spontaneous rupture of tympanic membrane 05/03/2022   Arthralgia of left acromioclavicular joint 04/10/2022   Tibialis posterior tendinitis, right 04/10/2022   Acute swimmer's ear of right side 03/06/2022   Insomnia 08/08/2021   Subclinical hypothyroidism 08/10/2020   Abnormal cardiac CT angiography    Chest pain of uncertain etiology 35/46/5681   Edema of right lower extremity 06/27/2020   LVH (left ventricular hypertrophy) 06/25/2020   Ovarian cyst    Essential hypertension    Herpes simplex virus (HSV) infection    GERD (gastroesophageal reflux disease)    Asthma    Anemia  Allergy    Mild mitral regurgitation by prior echocardiogram 11/14/2016   Venous stasis dermatitis of both lower extremities 10/23/2016   History of tachycardia 10/22/2016   Mild persistent asthma without complication 28/00/3491   Gastroesophageal reflux disease with esophagitis 08/16/2016   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   Post chemo evaluation 04/04/2015   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   DDD (degenerative disc disease), lumbar 08/11/2014   Muscle spasm of back 08/10/2014   Mid back pain 08/10/2014   Bilateral low back pain without sciatica 08/10/2014   History of ovarian cancer 01/20/2014   Gastric ulcer 09/14/2013   Anemia, iron deficiency 09/09/2013   Obesity 05/31/2011   ADHD 03/01/2011   OSA on CPAP  01/08/2011   EXTERNAL HEMORRHOIDS 01/09/2010   ALLERGIC RHINITIS 01/09/2010    PCP: Madilyn Fireman  REFERRING PROVIDER: Dianah Field  REFERRING DIAG: arthralgia of Lt acromioclavicular joint  THERAPY DIAG:  Acute pain of left shoulder  Muscle weakness (generalized)  Other symptoms and signs involving the musculoskeletal system  Rationale for Evaluation and Treatment: Rehabilitation  ONSET DATE: 2021  SUBJECTIVE:                                                                                                                                                                                      SUBJECTIVE STATEMENT:  Patient reports continued pain in the Lt shoulder which has continued to hurt with therapy and now pain is in more areas of the shoulder including front,back(shoulder blade area) and the upper arm. She was treated with rest and medication. 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 She also has increased pain with work tasks involving repetitive lifting. She is in a supervisory position at work so she can avoid the lifting activities for the most part.   PERTINENT HISTORY: From eval: 2-3 years ago pt was pulling shopping carts apart at her job at [employer] and felt a "pop" in her shoulder. She went to  urgent care and had to wear a sling but had no formal treatment. 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  Pain also increases with pushing up from the bed, pulling shopping carts. Pain eases with heat. Rt LE is constantly swollen and pt has had ankle pain starting in medial ankle sometimes going up to groin. She is no longer having pain but plans to follow up with MD about swelling.   PAIN:  Are you having pain? Yes: NPRS scale: 4/10 Pain location: Lt shoulder Pain description:  achey, occasionally  sharp Aggravating factors: lift, push/pull Relieving factors: heat  PRECAUTIONS: None  WEIGHT BEARING RESTRICTIONS: No  FALLS:  Has patient fallen in last 6 months? No  OCCUPATION: Freight forwarder at [employer], also coaches cheerleading  PLOF: Independent  PATIENT GOALS:reduce pain  NEXT MD VISIT:   OBJECTIVE:   UPPER EXTREMITY ROM:   Active ROM Right eval Left eval Left 1/25 Left  05/29/22  Shoulder flexion  170 pain 172 (mild pain on descend) 147 Pain at end range   Shoulder extension  [facility] 55  Shoulder abduction  170 170 155 pain at end range   Shoulder adduction  pain    Shoulder internal rotation  80 - pain 80  Thumb T5 pain   Shoulder external rotation  80 90 90 at 90/90 pain   Elbow flexion      Elbow extension      Wrist flexion      Wrist extension      Wrist ulnar deviation      Wrist radial deviation      Wrist pronation      Wrist supination      (Blank rows = not tested)  UPPER EXTREMITY MMT:  MMT Right eval Left eval Left  1/25  Shoulder flexion  4-/5pain 4-/5 mild pain  Shoulder extension  4-   Shoulder abduction  4 4-/5 mild pain  Shoulder adduction  4-/5 pain 4-/5 mild pain  Shoulder internal rotation     Shoulder external rotation     Middle trapezius     Lower trapezius     Elbow flexion  4 4+  Elbow extension  4 4+  Wrist flexion     Wrist extension     Wrist ulnar deviation     Wrist radial deviation     Wrist pronation     Wrist supination     Grip strength (lbs)     (Blank rows = not tested)  PALPATION:  Muscular tightness Lt > Rt pecs; upper traps; leveator; teres; biceps; deltoid with TP Lt AC joint and along Lt clavical    [facility] Adult PT Treatment:                                                DATE:  05/29/2022 Therapeutic Exercise: Chin tuck with noodle 10 sec x 5 Scap squeeze with noodle 10 sec x 10  L's with noddle 3 sec x 10 W's with noddle 3 sec x 10  Doorway stretch 3 positions 30 sec x 2 reps   Manual:   Taping bilat shoulders: correction of anterior position of head of the humerus one strip anterior shoulder to medial/distal scapula and inhibition of upper trap one strip upper trap to medial scapular area   Modalities:  Self care: Education re-use of noodle for standing with exercises and when sitting to improve alignment. Discussed sitting with noodle while driving and use of pillow to support Lt UE when sleeping on Rt side     05/24/2022 Therapeutic Exercise: Shoulder/Elbow MMT Shoulder ROM measurements 20#KB overhead raise 11#MB overhead throw/squat catch Fwd amb + 11#MB overhead hold 1L --> added lower/lift 1L Modalities: Moist heat on L shoulder during subjective intake x 34mn   [facility] Adult PT Treatment:  DATE: 05/17/2022 Therapeutic Exercise: Arm pulleys flex/scap/ER warm-up Seated UT/LS 3x30" 3x30" each Hand ABCs w/small green ball (shoulder stabilization) Fwd/Bkwd walking + 6#MB dynamic waiter's carry 3#MB overhead toss/catch on trampoline (L) 6#MB --> 11#MB squat toss/catch Doorway pec stretch 3x30" 5#KB --> 10#KB seated overhead press  Walking + 10#KB overhead press hold 2L Manual Therapy: ProneSTM/TPR UT/MT/PT    PATIENT EDUCATION: Education details: Dry needling info Person educated: Patient Education method: Explanation, Demonstration, and Handouts Education comprehension: verbalized understanding and returned demonstration  HOME EXERCISE PROGRAM: Access Code: [email or web address] URL: [email or web address] Date: 05/29/2022 Prepared by: Gillermo Murdoch  Exercises - Standing Shoulder and Trunk Flexion at Table  - 2 x daily - 7 x weekly - 1 sets - 3-5 reps - 10 sec  hold - Seated Shoulder Flexion Towel Slide at Table Top Full Range of Motion  - 2 x daily - 7 x weekly - 1 sets - 3-5 reps - 10sec  hold - Seated Cervical Retraction  - 2 x daily - 7 x weekly - 1-2 sets - 5-10 reps - 10 sec  hold - Seated Scapular  Retraction  - 2 x daily - 7 x weekly - 1-2 sets - 10 reps - 10 sec  hold - Seated Cervical Sidebending AROM  - 2 x daily - 7 x weekly - 1 sets - 5 reps - 5-10 sec  hold - Seated Cervical Rotation AROM  - 2 x daily - 7 x weekly - 1 sets - 5 reps - 2-3 sec  hold - Standing Backward Shoulder Rolls  - 2 x daily - 7 x weekly - 1 sets - 10 reps - 1-2 sec  hold - Circular Shoulder Pendulum with Table Support  - 3-4 x daily - 7 x weekly - 1 sets - 20-30 reps - Seated Shoulder External Rotation AAROM with Cane and Hand in Neutral  - 2 x daily - 7 x weekly - 1 sets - 5-10 reps - 5-10 sec  hold - Seated Cervical Retraction  - 3 x daily - 7 x weekly - 1 sets - 10 reps - Standing Scapular Retraction  - 3 x daily - 7 x weekly - 1 sets - 10 reps - 10 hold - Shoulder External Rotation and Scapular Retraction  - 3 x daily - 7 x weekly - 1 sets - 10 reps -   hold - Seated Shoulder W  - 2 x daily - 7 x weekly - 1 sets - 10 reps - 3 sec  hold - Doorway Pec Stretch at 60 Degrees Abduction  - 3 x daily - 7 x weekly - 1 sets - 3 reps - Doorway Pec Stretch at 90 Degrees Abduction  - 3 x daily - 7 x weekly - 1 sets - 3 reps - 30 seconds  hold - Doorway Pec Stretch at 120 Degrees Abduction  - 3 x daily - 7 x weekly - 1 sets - 3 reps - 30 second hold  hold ASSESSMENT:  CLINICAL IMPRESSION:  Patient has been seen for 10 PT visits with no significant improvement in shoulder symptoms and some increase in pain in the shoulder anteriorly through pecs/biceps as well as posterior in the scapular musculature and upper trap. Shoulder ROM remains limited at end ranges with some discomfort or mild pain reported. Shoulder strength is decreased.  Of most significant is poor posture and alignment with the head of the humerus anterior in orientation with scapulae abducted and rotated along the thoracic wall. There  is increased thoracic kyphosis with forward head position. Reviewed finding from re-eval and decided on plan to include postural  correction and trial of dry needling to address muscular tightness and imbalance. Patient will continue to benefit from skilled therapy to address ongoing pain symptoms and improve shoulder stability to improve functional activities for work and cheer coaching duties.  OBJECTIVE IMPAIRMENTS: decreased activity tolerance, decreased ROM, decreased strength, impaired UE functional use, and pain.     GOALS: Goals reviewed with patient? Yes  SHORT TERM GOALS: Target date: 04/27/2022   Pt will be independent in initial HEP Baseline: Goal status: INITIAL 1/3: MET  LONG TERM GOALS: Target date: 07/24/2022  Pt will be independent in advanced HEP Baseline:  Goal status: revised   2.  Pt will improve UE strength to 4+/5 to work and coach with decreased pain Baseline:  Goal status: revised   3.  Pt will tolerate lifting 70# overhead with bilat UE to return to cheerleading coaching Baseline:  Goal status: revised   4.  Pt will report working a full shift with pain <= 3/10 Baseline:  Goal status: revised  PLAN:  PT FREQUENCY: 2x/week  PT DURATION: 8 weeks   PLANNED INTERVENTIONS: Therapeutic exercises, Therapeutic activity, Neuromuscular re-education, Balance training, Gait training, Patient/Family education, Self Care, Joint mobilization, Aquatic Therapy, Dry Needling, Electrical stimulation, Cryotherapy, Moist heat, Taping, Vasopneumatic device, Ultrasound, Ionotophoresis '4mg'$ /ml Dexamethasone, Manual therapy, and Re-evaluation  PLAN FOR NEXT SESSION: Dry needling next visit; work on posture and alignment and decrease pain in order to progress with other planned interventions including: challenge scapular stability, progress postural strength and endurance, ball toss, overhead press   [place], PT 05/29/2022, 11:09 AM

## 2022-05-30 ENCOUNTER — Encounter: Payer: Self-pay | Admitting: Rehabilitative and Restorative Service Providers"

## 2022-05-31 ENCOUNTER — Other Ambulatory Visit: Payer: Self-pay | Admitting: *Deleted

## 2022-05-31 ENCOUNTER — Encounter: Payer: Self-pay | Admitting: Rehabilitative and Restorative Service Providers"

## 2022-05-31 ENCOUNTER — Ambulatory Visit: Payer: BC Managed Care – PPO | Attending: Sports Medicine | Admitting: Rehabilitative and Restorative Service Providers"

## 2022-05-31 DIAGNOSIS — M6281 Muscle weakness (generalized): Secondary | ICD-10-CM

## 2022-05-31 DIAGNOSIS — R29898 Other symptoms and signs involving the musculoskeletal system: Secondary | ICD-10-CM | POA: Insufficient documentation

## 2022-05-31 DIAGNOSIS — M25512 Pain in left shoulder: Secondary | ICD-10-CM | POA: Insufficient documentation

## 2022-05-31 DIAGNOSIS — R600 Localized edema: Secondary | ICD-10-CM

## 2022-05-31 NOTE — Therapy (Signed)
OUTPATIENT PHYSICAL THERAPY TREATMENT   Patient Name: Yolanda Mosley MRN: [id number] DOB:January 04, 1981, 42 y.o., female Today's Date: 05/31/2022  END OF SESSION:  PT End of Session - 05/31/22 0848     Visit Number 12    Number of Visits 30    Date for PT Re-Evaluation 07/24/22    PT Start Time 0845    PT Stop Time 0933    PT Time Calculation (min) 48 min    Activity Tolerance Patient tolerated treatment well               Past Medical History:  Diagnosis Date   ADHD 03/01/2011   ALLERGIC RHINITIS 01/09/2010   Qualifier: Diagnosis of  By: Madilyn Fireman MD, Barnetta Chapel     Allergy    Anemia    Anemia, iron deficiency 09/09/2013   Asthma    Bilateral low back pain without sciatica 08/10/2014   CA - cancer 2005   Germ Cell Tumor - Rt Ovary   DDD (degenerative disc disease), lumbar 08/11/2014   L5 and S1 via xrays 07/2014.     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  CPAP 7 cm water pressure.  Mild OSA AHI 8.7.    Ovarian cyst    left   Peripheral edema 10/22/2016   Post chemo evaluation 04/04/2015   Sleep apnea    Tachycardia    Venous stasis dermatitis of  both lower extremities 10/23/2016   Past Surgical History:  Procedure Laterality Date   LEFT HEART CATH AND CORONARY ANGIOGRAPHY N/A 07/14/2020   Procedure: LEFT HEART CATH AND CORONARY ANGIOGRAPHY;  Surgeon: Troy Sine, MD;  Location: [facility];  Service: Cardiovascular;  Laterality: N/A;   OMENTECTOMY  2005   ovary, bilater fallopian tubes removed  2005   SALPINGOOPHORECTOMY Right    Patient Active Problem List   Diagnosis Date Noted   Chronic periscapular pain on left side 05/24/2022   Non-recurrent acute suppurative otitis media of right ear without spontaneous rupture of tympanic membrane 05/03/2022   Arthralgia of left acromioclavicular joint 04/10/2022   Tibialis posterior tendinitis, right 04/10/2022   Acute swimmer's ear of right side 03/06/2022   Insomnia 08/08/2021   Subclinical hypothyroidism 08/10/2020   Abnormal cardiac CT angiography    Chest pain of uncertain etiology 41/93/7902   Edema of right lower extremity 06/27/2020   LVH (left ventricular hypertrophy) 06/25/2020   Ovarian cyst    Essential hypertension    Herpes simplex virus (HSV) infection    GERD (gastroesophageal reflux disease)    Asthma    Anemia  Allergy    Mild mitral regurgitation by prior echocardiogram 11/14/2016   Venous stasis dermatitis of both lower extremities 10/23/2016   History of tachycardia 10/22/2016   Mild persistent asthma without complication 16/01/9603   Gastroesophageal reflux disease with esophagitis 08/16/2016   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   Post chemo evaluation 04/04/2015   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   DDD (degenerative disc disease), lumbar 08/11/2014   Muscle spasm of back 08/10/2014   Mid back pain 08/10/2014   Bilateral low back pain without sciatica 08/10/2014   History of ovarian cancer 01/20/2014   Gastric ulcer 09/14/2013   Anemia, iron deficiency 09/09/2013   Obesity 05/31/2011   ADHD 03/01/2011   OSA on CPAP 01/08/2011    EXTERNAL HEMORRHOIDS 01/09/2010   ALLERGIC RHINITIS 01/09/2010    PCP: Madilyn Fireman  REFERRING PROVIDER: Dianah Field  REFERRING DIAG: arthralgia of Lt acromioclavicular joint  THERAPY DIAG:  Acute pain of left shoulder  Muscle weakness (generalized)  Other symptoms and signs involving the musculoskeletal system  Rationale for Evaluation and Treatment: Rehabilitation  ONSET DATE: 2021  SUBJECTIVE:                                                                                                                                                                                      SUBJECTIVE STATEMENT:  Patient reports that the tape created blisters and skin irritation. She forgot to tell PT that she had sensitivity to some types of tape - but had used K tape on ankle without issue. Blisters are small and rash almost gone. 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 She also has increased pain with work tasks involving repetitive lifting. She is in a supervisory position at work so she can avoid the lifting activities for the most part. From eval: 2-3 years ago pt was pulling shopping carts apart at her job at [employer] and felt a "pop" in her shoulder. She went to  urgent care and had to wear a sling but had no formal treatment. 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  Pain also increases with pushing up from the bed, pulling shopping carts. Pain eases with heat. Rt LE is constantly swollen and pt has had ankle pain starting in medial ankle sometimes going up to groin. She is no longer having pain but plans to follow up with MD about swelling.   PAIN:  Are you having pain?  Yes: NPRS scale: 2/10 Pain location: Lt shoulder Pain description: achey,  occasionally sharp Aggravating factors: lift, push/pull Relieving factors: heat  PRECAUTIONS: None  WEIGHT BEARING RESTRICTIONS: No  FALLS:  Has patient fallen in last 6 months? No  OCCUPATION: Freight forwarder at [employer], also coaches cheerleading  PLOF: Independent  PATIENT GOALS:reduce pain  NEXT MD VISIT:   OBJECTIVE:   UPPER EXTREMITY ROM:   Active ROM Right eval Left eval Left 1/25 Left  05/29/22  Shoulder flexion  170 pain 172 (mild pain on descend) 147 Pain at end range   Shoulder extension  [facility] 55  Shoulder abduction  170 170 155 pain at end range   Shoulder adduction  pain    Shoulder internal rotation  80 - pain 80  Thumb T5 pain   Shoulder external rotation  80 90 90 at 90/90 pain   Elbow flexion      Elbow extension      Wrist flexion      Wrist extension      Wrist ulnar deviation      Wrist radial deviation      Wrist pronation      Wrist supination      (Blank rows = not tested)  UPPER EXTREMITY MMT:  MMT Right eval Left eval Left  1/25  Shoulder flexion  4-/5pain 4-/5 mild pain  Shoulder extension  4-   Shoulder abduction  4 4-/5 mild pain  Shoulder adduction  4-/5 pain 4-/5 mild pain  Shoulder internal rotation     Shoulder external rotation     Middle trapezius     Lower trapezius     Elbow flexion  4 4+  Elbow extension  4 4+  Wrist flexion     Wrist extension     Wrist ulnar deviation     Wrist radial deviation     Wrist pronation     Wrist supination     Grip strength (lbs)     (Blank rows = not tested)  PALPATION:  Muscular tightness Lt > Rt pecs; upper traps; leveator; teres; biceps; deltoid with TP Lt AC joint and along Lt clavical    [facility] Adult PT Treatment:                                                DATE:  05/31/2022 Therapeutic Exercise: Chin tuck with noodle 10 sec x 5 Scap squeeze with noodle 10 sec x 10  L's with noddle 3 sec x 10 W's with noddle 3 sec x 10  Doorway stretch 3 positions 30 sec x 2 reps   Prone scap squeeze 10 sec x 10  Manual:  Skilled palpation to assess response to manual work and DN  STM bilat thoracic musculature PA and lateral mobs thoracic spine GradeII/III Trigger Point Dry-Needling   Treatment instructions: Expect mild to moderate muscle soreness. S/S of pneumothorax if dry needled over a lung field, and to seek immediate medical attention should they occur. Patient verbalized understanding of these instructions and education.  Patient Consent Given: Yes Education handout provided: Yes Muscles treated: Rt upper trap; teres; lats Electrical stimulation performed: No Parameters: N/A Treatment response/outcome: decreased palpable tightness    Modalities:  Moist heat shoulders x 10 min   Self care: Education re-use of noodle for standing with exercises and when sitting to improve alignment.  Discussed sitting with noodle while driving and use of pillow to support Lt UE when sleeping on Rt side   05/29/2022 Therapeutic Exercise: Chin tuck with noodle 10 sec x 5 Scap squeeze with noodle 10 sec x 10  L's with noddle 3 sec x 10 W's with noddle 3 sec x 10  Doorway stretch 3 positions 30 sec x 2 reps   Manual:  Taping bilat shoulders: correction of anterior position of head of the humerus one strip anterior shoulder to medial/distal scapula and inhibition of upper trap one strip upper trap to medial scapular area   Modalities:  Self care: Education re-use of noodle for standing with exercises and when sitting to improve alignment. Discussed sitting with noodle while driving and use of pillow to support Lt UE when sleeping on Rt side      [facility] Adult PT Treatment:                                                DATE: 05/17/2022 Therapeutic Exercise: Arm pulleys flex/scap/ER warm-up Seated UT/LS 3x30" 3x30" each Hand ABCs w/small green ball (shoulder stabilization) Fwd/Bkwd walking + 6#MB dynamic waiter's carry 3#MB overhead toss/catch on trampoline (L) 6#MB -->  11#MB squat toss/catch Doorway pec stretch 3x30" 5#KB --> 10#KB seated overhead press  Walking + 10#KB overhead press hold 2L Manual Therapy: ProneSTM/TPR UT/MT/PT    PATIENT EDUCATION: Education details: Dry needling info Person educated: Patient Education method: Explanation, Demonstration, and Handouts Education comprehension: verbalized understanding and returned demonstration  HOME EXERCISE PROGRAM: Access Code: [email or web address] URL: [email or web address] Date: 05/31/2022 Prepared by: Gillermo Murdoch  Exercises - Standing Shoulder and Trunk Flexion at Table  - 2 x daily - 7 x weekly - 1 sets - 3-5 reps - 10 sec  hold - Seated Shoulder Flexion Towel Slide at Table Top Full Range of Motion  - 2 x daily - 7 x weekly - 1 sets - 3-5 reps - 10sec  hold - Seated Cervical Retraction  - 2 x daily - 7 x weekly - 1-2 sets - 5-10 reps - 10 sec  hold - Seated Scapular Retraction  - 2 x daily - 7 x weekly - 1-2 sets - 10 reps - 10 sec  hold - Seated Cervical Sidebending AROM  - 2 x daily - 7 x weekly - 1 sets - 5 reps - 5-10 sec  hold - Seated Cervical Rotation AROM  - 2 x daily - 7 x weekly - 1 sets - 5 reps - 2-3 sec  hold - Standing Backward Shoulder Rolls  - 2 x daily - 7 x weekly - 1 sets - 10 reps - 1-2 sec  hold - Circular Shoulder Pendulum with Table Support  - 3-4 x daily - 7 x weekly - 1 sets - 20-30 reps - Seated Shoulder External Rotation AAROM with Cane and Hand in Neutral  - 2 x daily - 7 x weekly - 1 sets - 5-10 reps - 5-10 sec  hold - Seated Cervical Retraction  - 3 x daily - 7 x weekly - 1 sets - 10 reps - Standing Scapular Retraction  - 3 x daily - 7 x weekly - 1 sets - 10 reps - 10 hold - Shoulder External Rotation and Scapular Retraction  - 3 x daily - 7 x weekly - 1 sets - 10 reps -  hold - Seated Shoulder W  - 2 x daily - 7 x weekly - 1 sets - 10 reps - 3 sec  hold - Doorway Pec Stretch at 60 Degrees Abduction  - 3 x daily - 7 x weekly - 1 sets - 3 reps - Doorway  Pec Stretch at 90 Degrees Abduction  - 3 x daily - 7 x weekly - 1 sets - 3 reps - 30 seconds  hold - Doorway Pec Stretch at 120 Degrees Abduction  - 3 x daily - 7 x weekly - 1 sets - 3 reps - 30 second hold  hold - Prone Scapular Retraction  - 2 x daily - 7 x weekly - 1 sets - 5-10 reps - 3-5 sec  hold ASSESSMENT:  CLINICAL IMPRESSION:  Patient reports compliance with HEP. Continued to encourage postural correction and engaging posterior shoulder girdle musculature. She has continued pain in the shoulder anteriorly through pecs/biceps as well as posterior in the scapular musculature and upper trap. Shoulder ROM/strength are decreased.  Poor posture and alignment with the head of the humerus anterior in orientation with scapulae abducted and rotated along the thoracic wall with increased thoracic kyphosis directly influence the symptoms. Trial of DN and manual work tolerated well.   OBJECTIVE IMPAIRMENTS: decreased activity tolerance, decreased ROM, decreased strength, impaired UE functional use, and pain.     GOALS: Goals reviewed with patient? Yes  SHORT TERM GOALS: Target date: 04/27/2022   Pt will be independent in initial HEP Baseline: Goal status: INITIAL 1/3: MET  LONG TERM GOALS: Target date: 07/24/2022  Pt will be independent in advanced HEP Baseline:  Goal status: revised   2.  Pt will improve UE strength to 4+/5 to work and coach with decreased pain Baseline:  Goal status: revised   3.  Pt will tolerate lifting 70# overhead with bilat UE to return to cheerleading coaching Baseline:  Goal status: revised   4.  Pt will report working a full shift with pain <= 3/10 Baseline:  Goal status: revised  PLAN:  PT FREQUENCY: 2x/week  PT DURATION: 8 weeks   PLANNED INTERVENTIONS: Therapeutic exercises, Therapeutic activity, Neuromuscular re-education, Balance training, Gait training, Patient/Family education, Self Care, Joint mobilization, Aquatic Therapy, Dry  Needling, Electrical stimulation, Cryotherapy, Moist heat, Taping, Vasopneumatic device, Ultrasound, Ionotophoresis '4mg'$ /ml Dexamethasone, Manual therapy, and Re-evaluation  PLAN FOR NEXT SESSION: assess response to dry needling; work on posture and alignment and decrease pain in order to progress with other planned interventions including: challenge scapular stability, progress postural strength and endurance, ball toss, overhead press   Dakai Braithwaite P Helene Kelp, PT 05/31/2022, 9:04 AM

## 2022-06-05 ENCOUNTER — Encounter: Payer: Self-pay | Admitting: Family Medicine

## 2022-06-05 ENCOUNTER — Ambulatory Visit (HOSPITAL_COMMUNITY)
Admission: RE | Admit: 2022-06-05 | Discharge: 2022-06-05 | Disposition: A | Discharge status: Home / Self Care | Payer: BC Managed Care – PPO | Source: Ambulatory Visit | Attending: Vascular Surgery | Admitting: Vascular Surgery

## 2022-06-05 DIAGNOSIS — R000 Tachycardia, unspecified: Secondary | ICD-10-CM

## 2022-06-05 DIAGNOSIS — R002 Palpitations: Secondary | ICD-10-CM

## 2022-06-05 DIAGNOSIS — R600 Localized edema: Secondary | ICD-10-CM | POA: Diagnosis not present

## 2022-06-06 ENCOUNTER — Telehealth: Payer: Self-pay | Admitting: *Deleted

## 2022-06-06 ENCOUNTER — Ambulatory Visit (INDEPENDENT_AMBULATORY_CARE_PROVIDER_SITE_OTHER): Payer: BC Managed Care – PPO | Admitting: Vascular Surgery

## 2022-06-06 ENCOUNTER — Encounter: Payer: Self-pay | Admitting: Rehabilitative and Restorative Service Providers"

## 2022-06-06 ENCOUNTER — Ambulatory Visit: Payer: BC Managed Care – PPO | Admitting: Rehabilitative and Restorative Service Providers"

## 2022-06-06 DIAGNOSIS — M25512 Pain in left shoulder: Secondary | ICD-10-CM | POA: Diagnosis not present

## 2022-06-06 DIAGNOSIS — R29898 Other symptoms and signs involving the musculoskeletal system: Secondary | ICD-10-CM | POA: Diagnosis not present

## 2022-06-06 DIAGNOSIS — M7989 Other specified soft tissue disorders: Secondary | ICD-10-CM | POA: Diagnosis not present

## 2022-06-06 DIAGNOSIS — M6281 Muscle weakness (generalized): Secondary | ICD-10-CM

## 2022-06-06 NOTE — Progress Notes (Signed)
Virtual Visit via Telephone Note   I connected with Yolanda Mosley on 06/06/2022 using the [email or web address] by telephone and verified that I was speaking with the correct person using two identifiers.  I am located at the office with no one around.   The limitations of evaluation and management by telemedicine and the availability of in person appointments have been previously discussed with the patient and are documented in the patients chart. The patient expressed understanding and consented to proceed.  PCP: Hali Marry, MD Referring MD: Dr. Madilyn Fireman  Chief Complaint: right lower extremity swelling  History of Present Illness: Yolanda Mosley is a 42 y.o. female with history of previous pelvic cancer with surgery several years ago.  She now has approximate 6 to 8 months of right lower extremity swelling without any underlying injury.  She has no personal or family history of DVT and states that she has had multiple DVT studies which were all negative.  She had reflux study performed here yesterday.  No previous venous interventions.  She does not take blood thinners.  Also recently had MRI of the pelvis pelvic pain to rule out any recurrent cancer which was negative.  She has not worn compression stockings in the past.  She works at [employer] and is on her feet most of the day.   Past Medical History:  Diagnosis Date   ADHD 03/01/2011   ALLERGIC RHINITIS 01/09/2010   Qualifier: Diagnosis of  By: Madilyn Fireman MD, Barnetta Chapel     Allergy    Anemia    Anemia, iron deficiency 09/09/2013   Asthma    Bilateral low back pain without sciatica 08/10/2014   CA - cancer 2005   Germ Cell Tumor - Rt Ovary   DDD (degenerative disc disease), lumbar 08/11/2014   L5 and S1 via xrays 07/2014.     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  CPAP 7 cm water pressure.  Mild OSA AHI 8.7.    Ovarian cyst    left   Peripheral edema 10/22/2016   Post chemo evaluation 04/04/2015   Sleep apnea    Tachycardia    Venous stasis dermatitis of both lower extremities 10/23/2016    Past Surgical History:  Procedure Laterality Date   LEFT HEART CATH AND CORONARY ANGIOGRAPHY N/A 07/14/2020   Procedure: LEFT HEART CATH AND CORONARY ANGIOGRAPHY;  Surgeon: Troy Sine, MD;  Location: [facility];  Service: Cardiovascular;  Laterality: N/A;   OMENTECTOMY  2005   ovary, bilater fallopian tubes removed  2005   SALPINGOOPHORECTOMY Right  12 system ROS was negative unless otherwise noted in HPI   Observations/Objective: Patient demonstrates very understanding of our conversation    Venous Reflux Times  +--------------+---------+------+-----------+------------+--------+  RIGHT        Reflux NoRefluxReflux TimeDiameter cmsComments                          Yes                                   +--------------+---------+------+-----------+------------+--------+  CFV          no                                              +--------------+---------+------+-----------+------------+--------+  FV mid        no                                               +--------------+---------+------+-----------+------------+--------+  Popliteal    no                                              +--------------+---------+------+-----------+------------+--------+  GSV at [facility]    no                            0.46              +--------------+---------+------+-----------+------------+--------+  GSV prox thighno                            0.25              +--------------+---------+------+-----------+------------+--------+  GSV mid thigh no                            0.21              +--------------+---------+------+-----------+------------+--------+  GSV dist thighno                            0.32              +--------------+---------+------+-----------+------------+--------+  GSV at knee   no                            0.28              +--------------+---------+------+-----------+------------+--------+  GSV prox calf no                            0.29              +--------------+---------+------+-----------+------------+--------+  GSV mid calf            yes    >500 ms      0.22              +--------------+---------+------+-----------+------------+--------+  SSV Pop Fossa  no                            0.22              +--------------+---------+------+-----------+------------+--------+  SSV prox calf no                            0.50              +--------------+---------+------+-----------+------------+--------+  AASV         no                            0.54              +--------------+---------+------+-----------+------------+--------+      Summary:  Right:  - No evidence of deep vein thrombosis seen in the right lower extremity,  from the common femoral through the popliteal veins.  - No evidence of superficial venous thrombosis in the right lower  extremity.  - The deep venous system is competent. - The great saphenous is incompetent at the mid calf.  -  The small saphenous vein is competent.     Assessment and Plan: 42 year old female with 6 to 8 months of right lower extremity swelling which has been unexplained.  I have recommended thigh-high compression stockings if tolerable she states these may not fit due to the shape of her leg and for that I have recommended knee-high compression stockings.  She can pick these up in our office at her convenience or I have recommended elastic therapy in [place].  Her recent reflux study was negative but there was a large accessory saphenous vein that was not refluxing.  I reviewed her MRI which does not appear to demonstrate any central venous issues particularly on the right side.  As such I will have her follow-up in 3 to 4 months with repeat previously placed.  Patient demonstrates understanding of the timing to wear compression socks as well as the need for elevation of the leg when she is home from work.  Follow Up Instructions:   Follow up in 3 month(s)   I discussed the assessment and treatment plan with the patient. The patient was provided an opportunity to ask questions and all were answered. The patient agreed with the plan and demonstrated an understanding of the instructions.   The patient was advised to call back or seek an in-person evaluation if the symptoms worsen or if the condition fails to improve as anticipated.  I spent 15 minutes in discussion with the patient and in reviewing the patient's chart prior to telephone encounter.   Signed, Servando Snare [facility] Office: [phone]  06/06/2022, 10:00 AM

## 2022-06-06 NOTE — Telephone Encounter (Signed)
Pt called and stated that she had sent a mychart message regarding her heart rate and how it was elevated.   I asked her if she was experiencing any SOB or CP. She said that she did have some chest pain but this didn't last but for a few seconds. She states that she is feeling fine this morning.   She would like to know what Dr. Madilyn Fireman thought about this and what/if she need for her to come in.   Will fwd to pcp for advice.

## 2022-06-06 NOTE — Therapy (Addendum)
OUTPATIENT PHYSICAL THERAPY TREATMENT AND DISCHARGE SUMMARY  PHYSICAL THERAPY DISCHARGE SUMMARY  Visits from Start of Care: 13  Current functional level related to goals / functional outcomes: See progress note for discharge status    Remaining deficits: Unknown   Education / Equipment: HEP    Patient agrees to discharge. Patient goals were partially met. Patient is being discharged due to not returning since the last visit. Yolanda Mosley P. Helene Kelp PT, MPH 08/01/22 9:19 AM   Patient Name: Yolanda Mosley MRN: [id number] DOB:09-Nov-1980, 42 y.o., female Today's Date: 06/06/2022  END OF SESSION:  PT End of Session - 06/06/22 1102     Visit Number 13    Number of Visits 30    Date for PT Re-Evaluation 07/24/22    PT Start Time 1100    PT Stop Time [id number]    PT Time Calculation (min) 48 min    Activity Tolerance Patient tolerated treatment well               Past Medical History:  Diagnosis Date   ADHD 03/01/2011   ALLERGIC RHINITIS 01/09/2010   Qualifier: Diagnosis of  By: Madilyn Fireman MD, Catherine     Allergy    Anemia    Anemia, iron deficiency 09/09/2013   Asthma    Bilateral low back pain without sciatica 08/10/2014   CA - cancer 2005   Germ Cell Tumor - Rt Ovary   DDD (degenerative disc disease), lumbar 08/11/2014   L5 and S1 via xrays 07/2014.     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  CPAP 7 cm water pressure.  Mild OSA AHI 8.7.    Ovarian cyst    left   Peripheral edema 10/22/2016   Post chemo evaluation 04/04/2015   Sleep apnea    Tachycardia    Venous stasis dermatitis of both lower extremities 10/23/2016   Past Surgical History:  Procedure Laterality Date   LEFT HEART CATH AND CORONARY ANGIOGRAPHY N/A 07/14/2020   Procedure: LEFT HEART CATH AND CORONARY ANGIOGRAPHY;  Surgeon: Troy Sine, MD;  Location: [facility];  Service: Cardiovascular;  Laterality: N/A;   OMENTECTOMY  2005   ovary, bilater fallopian tubes removed  2005   SALPINGOOPHORECTOMY Right    Patient Active Problem List   Diagnosis Date Noted   Chronic periscapular pain on left side 05/24/2022   Non-recurrent acute suppurative otitis media of right ear without spontaneous rupture of tympanic membrane 05/03/2022   Arthralgia of left acromioclavicular joint 04/10/2022   Tibialis posterior tendinitis, right 04/10/2022   Acute swimmer's ear of right  side 03/06/2022   Insomnia 08/08/2021   Subclinical hypothyroidism 08/10/2020   Abnormal cardiac CT angiography    Chest pain of uncertain etiology [id number]   Edema of right lower extremity 06/27/2020   LVH (left ventricular hypertrophy) 06/25/2020   Ovarian cyst    Essential hypertension    Herpes simplex virus (HSV) infection    GERD (gastroesophageal reflux disease)    Asthma    Anemia    Allergy    Mild mitral regurgitation by prior echocardiogram 11/14/2016   Venous stasis dermatitis of both lower extremities 10/23/2016   History of tachycardia 10/22/2016   Mild persistent asthma without complication [id number]   Gastroesophageal reflux disease with esophagitis 08/16/2016   Hyperhydrosis  disorder 11/04/2015   Post chemo evaluation 04/04/2015   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   DDD (degenerative disc disease), lumbar 08/11/2014   Muscle spasm of back 08/10/2014   Mid back pain 08/10/2014   Bilateral low back pain without sciatica 08/10/2014   History of ovarian cancer 01/20/2014   Gastric ulcer 09/14/2013   Anemia, iron deficiency 09/09/2013   Obesity 05/31/2011   ADHD 03/01/2011   OSA on CPAP 01/08/2011   EXTERNAL HEMORRHOIDS 01/09/2010   ALLERGIC RHINITIS 01/09/2010    PCP: Madilyn Fireman  REFERRING PROVIDER: Dianah Field  REFERRING DIAG: arthralgia of Lt acromioclavicular joint  THERAPY DIAG:  Acute pain of left shoulder  Muscle weakness (generalized)  Other symptoms and signs involving the musculoskeletal system  Rationale for Evaluation and Treatment: Rehabilitation  ONSET DATE: 2021  SUBJECTIVE:                                                                                                                                                                                      SUBJECTIVE STATEMENT:  Patient reports that she was spore for 3 days following the needling but yesterday and today the Lt shoulder feels some better. Patient has continued pain in the Lt shoulder but may try the needling again.    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 She also has increased pain with work tasks involving repetitive lifting. She is in a supervisory position at work so she can avoid the lifting activities for the most part. From eval: 2-3 years ago pt was pulling shopping carts apart at her job at [employer] and felt a "pop" in her shoulder. She went to  urgent care and had to wear a sling but had no formal treatment. 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  Pain also  increases with pushing up from the bed, pulling shopping carts. Pain eases with heat. Rt LE is constantly swollen and pt has had ankle pain starting in medial ankle sometimes going up to groin. She is no longer having pain but plans to follow up with MD about swelling.   PAIN:  Are you having pain? Yes: NPRS scale: 2/10 Pain location: Lt shoulder Pain description: achey, occasionally sharp Aggravating factors: lift, push/pull Relieving factors: heat  PRECAUTIONS: None  WEIGHT BEARING RESTRICTIONS: No  FALLS:  Has patient fallen in last 6 months? No  OCCUPATION: Freight forwarder at [employer], also coaches cheerleading  PATIENT GOALS:reduce pain  NEXT MD VISIT:   OBJECTIVE:   UPPER EXTREMITY ROM:   Active ROM Right eval Left eval Left 1/25 Left  05/29/22  Shoulder flexion  170 pain 172 (mild pain on descend) 147 Pain at end range   Shoulder extension  [facility] 55  Shoulder abduction  170 170 155 pain at end range   Shoulder adduction  pain    Shoulder internal rotation  80 - pain 80  Thumb T5 pain   Shoulder external rotation  80 90 90 at 90/90 pain   Elbow flexion      Elbow extension      Wrist flexion      Wrist extension      Wrist ulnar deviation      Wrist radial deviation      Wrist pronation      Wrist supination      (Blank rows = not tested)  UPPER EXTREMITY MMT:  MMT Right eval Left eval Left  1/25  Shoulder flexion  4-/5pain 4-/5 mild pain  Shoulder extension  4-   Shoulder abduction  4 4-/5 mild pain  Shoulder adduction  4-/5 pain 4-/5 mild pain  Shoulder internal rotation     Shoulder external rotation     Middle trapezius     Lower trapezius     Elbow flexion  4 4+  Elbow extension  4 4+  Wrist flexion     Wrist extension     Wrist ulnar deviation     Wrist radial deviation     Wrist pronation     Wrist supination     Grip strength (lbs)     (Blank rows = not tested)  PALPATION:  Muscular tightness Lt > Rt pecs; upper traps; leveator; teres;  biceps; deltoid with TP Lt AC joint and along Lt clavical    [facility] Adult PT Treatment:                                                DATE:  06/06/2022 Therapeutic Exercise: Chin tuck with noodle 10 sec x 5 Scap squeeze with noodle 10 sec x 10  Moving 8 in ball in elbow flex/ext maintaining scapular control x 10 x 3 sets  L's with noddle red TB 3 sec x 10 x 3 W's with noddle red TB 3 sec x 10 x 3  Doorway stretch 3 positions 30 sec x 2 reps  Shoulder flexion stretch dowel in doorway 30 sec x 2  Prone scap squeeze 10 sec x 10 (some LBP - trial over large ther ex red ball) Prone scap squeeze arms at side over red ther ex ball 3 sec x 10 (has  ball at home - will try both prone and w/ball) Sitting thoracic extension coregeous ball  Shd flexion w/ thoracic extension coregeous ball 3 sec x 5  Trunk rotation w/coregeous ball (difficulty with form)  Neuromuscular re-education:  Working on neuromuscular re-ed for movement of UE's maintaining thoracic postural musculature activation standing with noodle along spine; sitting with verbal cues for posture and alignment  Manual:  PT assist with thoracic extension in sitting with coregeous ball T spine.  Self care: Education re-use of noodle for standing with exercises and when sitting to improve alignment. Discussed sitting with noodle while driving and use of pillow to support Lt UE when sleeping on Rt side   05/31/2022 Therapeutic Exercise: Chin tuck with noodle 10 sec x 5 Scap squeeze with noodle 10 sec x 10  L's with noddle 3 sec x 10 W's with noddle 3 sec x 10  Doorway stretch 3 positions 30 sec x 2 reps  Prone scap squeeze 10 sec x 10  Manual:  Skilled palpation to assess response to manual work and DN  STM bilat thoracic musculature PA and lateral mobs thoracic spine GradeII/III Trigger Point Dry-Needling   Treatment instructions: Expect mild to moderate muscle soreness. S/S of pneumothorax if dry needled over a lung field, and to seek  immediate medical attention should they occur. Patient verbalized understanding of these instructions and education.  Patient Consent Given: Yes Education handout provided: Yes Muscles treated: Rt upper trap; teres; lats Electrical stimulation performed: No Parameters: N/A Treatment response/outcome: decreased palpable tightness    Modalities:  Moist heat shoulders x 10 min   Self care: Education re-use of noodle for standing with exercises and when sitting to improve alignment. Discussed sitting with noodle while driving and use of pillow to support Lt UE when sleeping on Rt side     PATIENT EDUCATION: Education details: Dry needling info Person educated: Patient Education method: Explanation, Demonstration, and Handouts Education comprehension: verbalized understanding and returned demonstration  HOME EXERCISE PROGRAM: Access Code: [email or web address] URL: [email or web address] Date: 06/06/2022 Prepared by: Gillermo Murdoch  Program Notes sit or stand with chest up and shoulders down and back then move arms in strike zone while maintaining scapular control 1- 2 min several reps   Exercises - Standing Shoulder and Trunk Flexion at Table  - 2 x daily - 7 x weekly - 1 sets - 3-5 reps - 10 sec  hold - Seated Shoulder Flexion Towel Slide at Table Top Full Range of Motion  - 2 x daily - 7 x weekly - 1 sets - 3-5 reps - 10sec  hold - Seated Cervical Retraction  - 2 x daily - 7 x weekly - 1-2 sets - 5-10 reps - 10 sec  hold - Seated Scapular Retraction  - 2 x daily - 7 x weekly - 1-2 sets - 10 reps - 10 sec  hold - Seated Cervical Sidebending AROM  - 2 x daily - 7 x weekly - 1 sets - 5 reps - 5-10 sec  hold - Seated Cervical Rotation AROM  - 2 x daily - 7 x weekly - 1 sets - 5 reps - 2-3 sec  hold - Standing Backward Shoulder Rolls  - 2 x daily - 7 x weekly - 1 sets - 10 reps - 1-2 sec  hold - Circular Shoulder Pendulum with Table Support  - 3-4 x daily - 7 x weekly - 1 sets - 20-30  reps - Seated Shoulder External Rotation AAROM with Cane and  Hand in Neutral  - 2 x daily - 7 x weekly - 1 sets - 5-10 reps - 5-10 sec  hold - Seated Cervical Retraction  - 3 x daily - 7 x weekly - 1 sets - 10 reps - Standing Scapular Retraction  - 3 x daily - 7 x weekly - 1 sets - 10 reps - 10 hold - Shoulder External Rotation and Scapular Retraction  - 3 x daily - 7 x weekly - 1 sets - 10 reps -   hold - Seated Shoulder W  - 2 x daily - 7 x weekly - 1 sets - 10 reps - 3 sec  hold - Doorway Pec Stretch at 60 Degrees Abduction  - 3 x daily - 7 x weekly - 1 sets - 3 reps - Doorway Pec Stretch at 90 Degrees Abduction  - 3 x daily - 7 x weekly - 1 sets - 3 reps - 30 seconds  hold - Doorway Pec Stretch at 120 Degrees Abduction  - 3 x daily - 7 x weekly - 1 sets - 3 reps - 30 second hold  hold - Prone Scapular Retraction  - 2 x daily - 7 x weekly - 1 sets - 5-10 reps - 3-5 sec  hold - Standing Isometric Shoulder Extension with Doorway - Arm Bent  - 2 x daily - 7 x weekly - 1 sets - 5-10 reps - 5 sec  hold - Isometric Shoulder Abduction at Wall  - 2 x daily - 7 x weekly - 1 sets - 5-10 reps - 5 sec  hold - Isometric Shoulder External Rotation at Wall  - 2 x daily - 7 x weekly - 1 sets - 10 reps - 5 sec  hold - Standing Isometric Shoulder Internal Rotation at Doorway  - 2 x daily - 7 x weekly - 1 sets - 10 reps - 5 sec  hold - Shoulder External Rotation and Scapular Retraction with Resistance  - 2 x daily - 7 x weekly - 1 sets - 10 reps - 3-5 sec  hold - Shoulder W - External Rotation with Resistance  - 2 x daily - 7 x weekly - 1-2 sets - 10 reps - 3 sec  hold - Prone Scapular Retraction  - 2 x daily - 7 x weekly - 1 sets - 5-10 reps - 3-5 sec  hold - Prone Scapular Retraction on Swiss Ball  - 1 x daily - 7 x weekly - 1-3 sets - 10 reps - 3-5 sec  hold ASSESSMENT:  CLINICAL IMPRESSION:  Patient reports some improvement with shoulder pain after soreness from needling subsided. compliance with HEP.  Continued to encourage postural correction and engaging posterior shoulder girdle musculature. She has continued pain in the shoulder anteriorly through pecs/biceps as well as posterior in the scapular musculature and upper trap. Shoulder ROM/strength are decreased. She has difficulty maintaining middle/lower trap activation for exercises and functional activities. Poor posture and alignment with the head of the humerus anterior in orientation with scapulae abducted and rotated along the thoracic wall with increased thoracic kyphosis directly influence the symptoms. Needs neuromuscuar re-ed.   OBJECTIVE IMPAIRMENTS: decreased activity tolerance, decreased ROM, decreased strength, impaired UE functional use, and pain.     GOALS: Goals reviewed with patient? Yes  SHORT TERM GOALS: Target date: 04/27/2022   Pt will be independent in initial HEP Baseline: Goal status: INITIAL 1/3: MET  LONG TERM GOALS: Target date: 07/24/2022  Pt will be independent  in advanced HEP Baseline:  Goal status: revised   2.  Pt will improve UE strength to 4+/5 to work and coach with decreased pain Baseline:  Goal status: revised   3.  Pt will tolerate lifting 70# overhead with bilat UE to return to cheerleading coaching Baseline:  Goal status: revised   4.  Pt will report working a full shift with pain <= 3/10 Baseline:  Goal status: revised  PLAN:  PT FREQUENCY: 2x/week  PT DURATION: 8 weeks   PLANNED INTERVENTIONS: Therapeutic exercises, Therapeutic activity, Neuromuscular re-education, Balance training, Gait training, Patient/Family education, Self Care, Joint mobilization, Aquatic Therapy, Dry Needling, Electrical stimulation, Cryotherapy, Moist heat, Taping, Vasopneumatic device, Ultrasound, Ionotophoresis 4mg /ml Dexamethasone, Manual therapy, and Re-evaluation  PLAN FOR NEXT SESSION: assess response to dry needling; work on posture and alignment and decrease pain in order to progress with  other planned interventions including: challenge scapular stability, progress postural strength and endurance, ball toss, overhead press   Yolanda Mosley P Yolanda Mosley, PT 06/06/2022, 11:03 AM

## 2022-06-06 NOTE — Addendum Note (Signed)
Addended by: Beatrice Lecher D on: 06/06/2022 03:26 PM   Modules accepted: Orders

## 2022-06-07 ENCOUNTER — Ambulatory Visit: Payer: BC Managed Care – PPO | Attending: Family Medicine

## 2022-06-07 ENCOUNTER — Telehealth: Payer: Self-pay | Admitting: *Deleted

## 2022-06-07 DIAGNOSIS — R000 Tachycardia, unspecified: Secondary | ICD-10-CM

## 2022-06-07 DIAGNOSIS — R002 Palpitations: Secondary | ICD-10-CM

## 2022-06-07 NOTE — Progress Notes (Unsigned)
Enrolled for [employer] to mail a ZIO XT long term holter monitor to the patients address on file.   Mailed monitor [id number] switched out with office inventory [id number] and applied with tincture on benzoin due to extreme adhesive sensitivity.  Linton Ham from [place] notified.  DOD to read.

## 2022-06-07 NOTE — Telephone Encounter (Signed)
Patient called and left message stating she tried to wear compression stockings and her right leg went numb she was only wearing them on her right leg.she stated that then her left leg went numb she was not wearing compression on that leg. Called patient back and got voice mail. Advised patient if happening with no compression to follow up with her PCP for evaluation.

## 2022-06-12 ENCOUNTER — Ambulatory Visit: Payer: BC Managed Care – PPO | Attending: Cardiovascular Disease

## 2022-06-12 ENCOUNTER — Encounter: Payer: Self-pay | Admitting: Rehabilitative and Restorative Service Providers"

## 2022-06-12 ENCOUNTER — Telehealth: Payer: Self-pay | Admitting: *Deleted

## 2022-06-12 ENCOUNTER — Other Ambulatory Visit: Payer: Self-pay

## 2022-06-12 ENCOUNTER — Ambulatory Visit: Payer: BC Managed Care – PPO | Admitting: Rehabilitative and Restorative Service Providers"

## 2022-06-12 DIAGNOSIS — M25512 Pain in left shoulder: Secondary | ICD-10-CM

## 2022-06-12 DIAGNOSIS — R002 Palpitations: Secondary | ICD-10-CM

## 2022-06-12 DIAGNOSIS — M6281 Muscle weakness (generalized): Secondary | ICD-10-CM | POA: Diagnosis not present

## 2022-06-12 DIAGNOSIS — R29898 Other symptoms and signs involving the musculoskeletal system: Secondary | ICD-10-CM

## 2022-06-12 DIAGNOSIS — R000 Tachycardia, unspecified: Secondary | ICD-10-CM | POA: Diagnosis not present

## 2022-06-12 DIAGNOSIS — M7989 Other specified soft tissue disorders: Secondary | ICD-10-CM

## 2022-06-12 DIAGNOSIS — R600 Localized edema: Secondary | ICD-10-CM

## 2022-06-12 NOTE — Therapy (Signed)
OUTPATIENT PHYSICAL THERAPY TREATMENT   Patient Name: Yolanda Mosley MRN: [id number] DOB:January 31, 1981, 42 y.o., female Today's Date: 06/12/2022  END OF SESSION:  PT End of Session - 06/12/22 1016     Visit Number 14    Number of Visits 30    Date for PT Re-Evaluation 07/24/22    PT Start Time [id number]    PT Stop Time 1100    PT Time Calculation (min) 45 min    Activity Tolerance Patient tolerated treatment well               Past Medical History:  Diagnosis Date   ADHD 03/01/2011   ALLERGIC RHINITIS 01/09/2010   Qualifier: Diagnosis of  By: Madilyn Fireman MD, Catherine     Allergy    Anemia    Anemia, iron deficiency 09/09/2013   Asthma    Bilateral low back pain without sciatica 08/10/2014   CA - cancer 2005   Germ Cell Tumor - Rt Ovary   DDD (degenerative disc disease), lumbar 08/11/2014   L5 and S1 via xrays 07/2014.     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  CPAP 7 cm water pressure.  Mild OSA AHI 8.7.    Ovarian cyst    left   Peripheral edema 10/22/2016   Post chemo evaluation 04/04/2015   Sleep apnea    Tachycardia    Venous stasis dermatitis  of both lower extremities 10/23/2016   Past Surgical History:  Procedure Laterality Date   LEFT HEART CATH AND CORONARY ANGIOGRAPHY N/A 07/14/2020   Procedure: LEFT HEART CATH AND CORONARY ANGIOGRAPHY;  Surgeon: Troy Sine, MD;  Location: [facility];  Service: Cardiovascular;  Laterality: N/A;   OMENTECTOMY  2005   ovary, bilater fallopian tubes removed  2005   SALPINGOOPHORECTOMY Right    Patient Active Problem List   Diagnosis Date Noted   Chronic periscapular pain on left side 05/24/2022   Non-recurrent acute suppurative otitis media of right ear without spontaneous rupture of tympanic membrane 05/03/2022   Arthralgia of left acromioclavicular joint 04/10/2022   Tibialis posterior tendinitis, right 04/10/2022   Acute swimmer's ear of right side 03/06/2022   Insomnia 08/08/2021   Subclinical hypothyroidism 08/10/2020   Abnormal cardiac CT angiography    Chest pain of uncertain etiology [id number]   Edema of right lower extremity 06/27/2020   LVH (left ventricular hypertrophy) 06/25/2020   Ovarian cyst    Essential hypertension    Herpes simplex virus (HSV) infection    GERD (gastroesophageal reflux disease)    Asthma    Anemia  Allergy    Mild mitral regurgitation by prior echocardiogram 11/14/2016   Venous stasis dermatitis of both lower extremities 10/23/2016   History of tachycardia 10/22/2016   Mild persistent asthma without complication [id number]   Gastroesophageal reflux disease with esophagitis 08/16/2016   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   Post chemo evaluation 04/04/2015   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   DDD (degenerative disc disease), lumbar 08/11/2014   Muscle spasm of back 08/10/2014   Mid back pain 08/10/2014   Bilateral low back pain without sciatica 08/10/2014   History of ovarian cancer 01/20/2014   Gastric ulcer 09/14/2013   Anemia, iron deficiency 09/09/2013   Obesity 05/31/2011   ADHD 03/01/2011   OSA on CPAP  01/08/2011   EXTERNAL HEMORRHOIDS 01/09/2010   ALLERGIC RHINITIS 01/09/2010    PCP: Madilyn Fireman  REFERRING PROVIDER: Dianah Field  REFERRING DIAG: arthralgia of Lt acromioclavicular joint  THERAPY DIAG:  Acute pain of left shoulder  Muscle weakness (generalized)  Other symptoms and signs involving the musculoskeletal system  Rationale for Evaluation and Treatment: Rehabilitation  ONSET DATE: 2021  SUBJECTIVE:                                                                                                                                                                                      SUBJECTIVE STATEMENT:  Patient reports that she has no shoulder pain today. Had some pain yesterday but is not sure why. She is working on her exercises  and is trying to work on posture and alignment at home. Feels she can continue with her exercises on her own and would like to stop therapy for now. She is having cardiac testing to assess irregular heart rate.   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 She also has increased pain with work tasks involving repetitive lifting. She is in a supervisory position at work so she can avoid the lifting activities for the most part. From eval: 2-3 years ago pt was pulling shopping carts apart at her job at [employer] and felt a "pop" in her shoulder. She went to  urgent care and had to wear a sling but had no formal treatment. 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  Pain also increases with pushing up from the bed, pulling shopping carts. Pain eases with heat. Rt LE is constantly swollen and pt has had ankle pain starting in medial ankle sometimes going up to groin. She is no longer having pain but plans to  follow up with MD about swelling.   PAIN:  Are you having  pain? Yes: NPRS scale: 0/10 Pain location: Lt shoulder Pain description: achey, occasionally sharp Aggravating factors: lift, push/pull Relieving factors: heat  PRECAUTIONS: None  WEIGHT BEARING RESTRICTIONS: No  FALLS:  Has patient fallen in last 6 months? No  OCCUPATION: Freight forwarder at [employer], also coaches cheerleading  PATIENT GOALS:reduce pain  NEXT MD VISIT:   OBJECTIVE:   UPPER EXTREMITY ROM:   Active ROM Right eval Left eval Left 1/25 Left  05/29/22 Left  06/12/22  Shoulder flexion  170 pain 172 (mild pain on descend) 147 Pain at end range  170 pain at end range  Shoulder extension  [facility] WFL 55 73 no pain   Shoulder abduction  170 170 155 pain at end range  170 pain end range   Shoulder adduction  pain     Shoulder internal rotation  80 - pain 80  Thumb T5 pain  Thumb T5 no pain   Shoulder external rotation  80 90 90 at 90/90 pain  90 at 90/90 no pain   Elbow flexion       Elbow extension       Wrist flexion       Wrist extension       Wrist ulnar deviation       Wrist radial deviation       Wrist pronation       Wrist supination       (Blank rows = not tested)  UPPER EXTREMITY MMT:  MMT Right eval Left eval Left  1/25 Left  06/12/22  Shoulder flexion  4-/5pain 4-/5 mild pain 5/5  Shoulder extension  4-  5/5  Shoulder abduction  4 4-/5 mild pain 5-/5  Shoulder adduction  4-/5 pain 4-/5 mild pain 5/5  Shoulder internal rotation      Shoulder external rotation      Middle trapezius      Lower trapezius      Elbow flexion  4 4+ 5  Elbow extension  4 4+ 5  Wrist flexion      Wrist extension      Wrist ulnar deviation      Wrist radial deviation      Wrist pronation      Wrist supination      Grip strength (lbs)      (Blank rows = not tested)  PALPATION:  Muscular tightness Lt > Rt pecs; upper traps; leveator; teres; biceps; deltoid with TP Lt AC joint and along Lt clavical    [facility] Adult PT Treatment:                                                 DATE:  06/12/2022 Therapeutic Exercise: Wall plank 30 sec x 2 Chin tuck with noodle 10 sec x 5 Scap squeeze with noodle 10 sec x 10  Moving 8 in ball in elbow flex/ext maintaining scapular control x 10 x 3 sets  L's with noddle red TB 3 sec x 10 x 3 W's with noddle red TB 3 sec x 10 x 3  Doorway stretch 3 positions 30 sec x 2 reps  Shoulder flexion stretch dowel in doorway 30 sec x 2  Prone scap squeeze 10 sec x 10 (some LBP - trial over large ther ex red ball) Prone scap  squeeze arms at side over red ther ex ball 3 sec x 10 (has ball at home - will try both prone and w/ball) Sitting thoracic extension coregeous ball    Neuromuscular re-education:  Working on neuromuscular re-ed for movement of UE's maintaining thoracic postural musculature activation standing with noodle along spine; sitting with verbal cues for posture and alignment  Self care: Reinforced use of noodle for standing with exercises and when sitting to improve alignment. Discussed sitting with noodle while driving and use of pillow to support Lt UE when sleeping on Rt side   06/06/2022 Therapeutic Exercise: Chin tuck with noodle 10 sec x 5 Scap squeeze with noodle 10 sec x 10  Moving 8 in ball in elbow flex/ext maintaining scapular control x 10 x 3 sets  L's with noddle red TB 3 sec x 10 x 3 W's with noddle red TB 3 sec x 10 x 3  Doorway stretch 3 positions 30 sec x 2 reps  Shoulder flexion stretch dowel in doorway 30 sec x 2  Prone scap squeeze 10 sec x 10 (some LBP - trial over large ther ex red ball) Prone scap squeeze arms at side over red ther ex ball 3 sec x 10 (has ball at home - will try both prone and w/ball) Sitting thoracic extension coregeous ball  Shd flexion w/ thoracic extension coregeous ball 3 sec x 5  Trunk rotation w/coregeous ball (difficulty with form)  Neuromuscular re-education:  Working on neuromuscular re-ed for movement of UE's maintaining thoracic postural musculature activation standing  with noodle along spine; sitting with verbal cues for posture and alignment  Manual:  PT assist with thoracic extension in sitting with coregeous ball T spine.  Self care: Education re-use of noodle for standing with exercises and when sitting to improve alignment. Discussed sitting with noodle while driving and use of pillow to support Lt UE when sleeping on Rt side   05/31/2022(dry needling)  Therapeutic Exercise: Chin tuck with noodle 10 sec x 5 Scap squeeze with noodle 10 sec x 10  L's with noddle 3 sec x 10 W's with noddle 3 sec x 10  Doorway stretch 3 positions 30 sec x 2 reps  Prone scap squeeze 10 sec x 10  Manual:  Skilled palpation to assess response to manual work and DN  STM bilat thoracic musculature PA and lateral mobs thoracic spine GradeII/III Trigger Point Dry-Needling   Treatment instructions: Expect mild to moderate muscle soreness. S/S of pneumothorax if dry needled over a lung field, and to seek immediate medical attention should they occur. Patient verbalized understanding of these instructions and education.  Patient Consent Given: Yes Education handout provided: Yes Muscles treated: Rt upper trap; teres; lats Electrical stimulation performed: No Parameters: N/A Treatment response/outcome: decreased palpable tightness    Modalities:  Moist heat shoulders x 10 min   Self care: Education re-use of noodle for standing with exercises and when sitting to improve alignment. Discussed sitting with noodle while driving and use of pillow to support Lt UE when sleeping on Rt side     PATIENT EDUCATION: Education details: Dry needling info Person educated: Patient Education method: Explanation, Demonstration, and Handouts Education comprehension: verbalized understanding and returned demonstration  HOME EXERCISE PROGRAM: Access Code: [email or web address] URL: [email or web address] Date: 06/06/2022 Prepared by: Gillermo Murdoch  Program Notes sit or stand with  chest up and shoulders down and back then move arms in strike zone while maintaining scapular control 1- 2 min several reps   Exercises -  Standing Shoulder and Trunk Flexion at Table  - 2 x daily - 7 x weekly - 1 sets - 3-5 reps - 10 sec  hold - Seated Shoulder Flexion Towel Slide at Table Top Full Range of Motion  - 2 x daily - 7 x weekly - 1 sets - 3-5 reps - 10sec  hold - Seated Cervical Retraction  - 2 x daily - 7 x weekly - 1-2 sets - 5-10 reps - 10 sec  hold - Seated Scapular Retraction  - 2 x daily - 7 x weekly - 1-2 sets - 10 reps - 10 sec  hold - Seated Cervical Sidebending AROM  - 2 x daily - 7 x weekly - 1 sets - 5 reps - 5-10 sec  hold - Seated Cervical Rotation AROM  - 2 x daily - 7 x weekly - 1 sets - 5 reps - 2-3 sec  hold - Standing Backward Shoulder Rolls  - 2 x daily - 7 x weekly - 1 sets - 10 reps - 1-2 sec  hold - Circular Shoulder Pendulum with Table Support  - 3-4 x daily - 7 x weekly - 1 sets - 20-30 reps - Seated Shoulder External Rotation AAROM with Cane and Hand in Neutral  - 2 x daily - 7 x weekly - 1 sets - 5-10 reps - 5-10 sec  hold - Seated Cervical Retraction  - 3 x daily - 7 x weekly - 1 sets - 10 reps - Standing Scapular Retraction  - 3 x daily - 7 x weekly - 1 sets - 10 reps - 10 hold - Shoulder External Rotation and Scapular Retraction  - 3 x daily - 7 x weekly - 1 sets - 10 reps -   hold - Seated Shoulder W  - 2 x daily - 7 x weekly - 1 sets - 10 reps - 3 sec  hold - Doorway Pec Stretch at 60 Degrees Abduction  - 3 x daily - 7 x weekly - 1 sets - 3 reps - Doorway Pec Stretch at 90 Degrees Abduction  - 3 x daily - 7 x weekly - 1 sets - 3 reps - 30 seconds  hold - Doorway Pec Stretch at 120 Degrees Abduction  - 3 x daily - 7 x weekly - 1 sets - 3 reps - 30 second hold  hold - Prone Scapular Retraction  - 2 x daily - 7 x weekly - 1 sets - 5-10 reps - 3-5 sec  hold - Standing Isometric Shoulder Extension with Doorway - Arm Bent  - 2 x daily - 7 x weekly - 1 sets -  5-10 reps - 5 sec  hold - Isometric Shoulder Abduction at Wall  - 2 x daily - 7 x weekly - 1 sets - 5-10 reps - 5 sec  hold - Isometric Shoulder External Rotation at Wall  - 2 x daily - 7 x weekly - 1 sets - 10 reps - 5 sec  hold - Standing Isometric Shoulder Internal Rotation at Doorway  - 2 x daily - 7 x weekly - 1 sets - 10 reps - 5 sec  hold - Shoulder External Rotation and Scapular Retraction with Resistance  - 2 x daily - 7 x weekly - 1 sets - 10 reps - 3-5 sec  hold - Shoulder W - External Rotation with Resistance  - 2 x daily - 7 x weekly - 1-2 sets - 10 reps - 3 sec  hold -  Prone Scapular Retraction  - 2 x daily - 7 x weekly - 1 sets - 5-10 reps - 3-5 sec  hold - Prone Scapular Retraction on Swiss Ball  - 1 x daily - 7 x weekly - 1-3 sets - 10 reps - 3-5 sec  hold ASSESSMENT:  CLINICAL IMPRESSION:  Patient reports some improvement with shoulder pain and compliance with HEP and postural correction. Continued to encourage postural correction and engaging posterior shoulder girdle musculature. She has intermittent pain in the shoulder anteriorly through pecs/biceps as well as posterior in the scapular musculature and upper trap. Shoulder ROM/strength are increased per assessment. She has difficulty maintaining middle/lower trap activation for exercises and functional activities. Poor posture and alignment with the head of the humerus anterior in orientation with scapulae abducted and rotated along the thoracic wall with increased thoracic kyphosis directly influence the symptoms. Needs to continue with neuromuscuar re-ed exercises and activities as well as improving pasture. Patient wants to continue with independent HEP and will call with any questions or problems.   OBJECTIVE IMPAIRMENTS: decreased activity tolerance, decreased ROM, decreased strength, impaired UE functional use, and pain.     GOALS: Goals reviewed with patient? Yes  SHORT TERM GOALS: Target date: 04/27/2022   Pt will  be independent in initial HEP Baseline: Goal status: INITIAL 1/3: MET  LONG TERM GOALS: Target date: 07/24/2022  Pt will be independent in advanced HEP Baseline:  Goal status: met   2.  Pt will improve UE strength to 4+/5 to work and coach with decreased pain Baseline:  Goal status: met   3.  Pt will tolerate lifting 70# overhead with bilat UE to return to cheerleading coaching Baseline:  Goal status: not met    4.  Pt will report working a full shift with pain <= 3/10 Baseline:  Goal status: partially met   PLAN:  PT FREQUENCY: 2x/week  PT DURATION: 8 weeks   PLANNED INTERVENTIONS: Therapeutic exercises, Therapeutic activity, Neuromuscular re-education, Balance training, Gait training, Patient/Family education, Self Care, Joint mobilization, Aquatic Therapy, Dry Needling, Electrical stimulation, Cryotherapy, Moist heat, Taping, Vasopneumatic device, Ultrasound, Ionotophoresis 2m/ml Dexamethasone, Manual therapy, and Re-evaluation  PLAN FOR NEXT SESSION: assess response to dry needling; work on posture and alignment and decrease pain in order to progress with other planned interventions including: challenge scapular stability, progress postural strength and endurance, ball toss, overhead press   Darsi Tien PNilda Simmer PT 06/12/2022, 10:43 AM

## 2022-06-12 NOTE — Telephone Encounter (Signed)
Patient calling to schedule an appointment to have her monitor put on.

## 2022-06-12 NOTE — Telephone Encounter (Signed)
Patient scheduled to have ZIO XT applied in office 06/12/22 at 3:00PM.

## 2022-06-14 ENCOUNTER — Other Ambulatory Visit: Payer: Self-pay | Admitting: Family Medicine

## 2022-06-17 DIAGNOSIS — R051 Acute cough: Secondary | ICD-10-CM | POA: Diagnosis not present

## 2022-06-17 DIAGNOSIS — R070 Pain in throat: Secondary | ICD-10-CM | POA: Diagnosis not present

## 2022-06-17 DIAGNOSIS — R600 Localized edema: Secondary | ICD-10-CM | POA: Diagnosis not present

## 2022-06-17 DIAGNOSIS — R0981 Nasal congestion: Secondary | ICD-10-CM | POA: Diagnosis not present

## 2022-06-18 ENCOUNTER — Encounter: Payer: Self-pay | Admitting: Family Medicine

## 2022-06-18 ENCOUNTER — Other Ambulatory Visit: Payer: Self-pay | Admitting: Family Medicine

## 2022-06-18 ENCOUNTER — Ambulatory Visit (INDEPENDENT_AMBULATORY_CARE_PROVIDER_SITE_OTHER): Payer: BC Managed Care – PPO | Admitting: Family Medicine

## 2022-06-18 VITALS — BP 119/81 | HR 83 | Temp 97.4°F | Ht 64.0 in | Wt 217.9 lb

## 2022-06-18 DIAGNOSIS — J069 Acute upper respiratory infection, unspecified: Secondary | ICD-10-CM | POA: Diagnosis not present

## 2022-06-18 MED ORDER — METHYLPREDNISOLONE 4 MG PO TBPK: ORAL_TABLET | ORAL | 0 refills | Status: DC

## 2022-06-18 MED ORDER — ALBUTEROL SULFATE HFA 108 (90 BASE) MCG/ACT IN AERS: 2.0000 | INHALATION_SPRAY | RESPIRATORY_TRACT | 99 refills | Status: DC | PRN

## 2022-06-18 NOTE — Progress Notes (Signed)
Acute Office Visit  Subjective:     Patient ID: Yolanda Mosley, female    DOB: [birth year], 42 y.o.   MRN: [id number]  Chief Complaint  Patient presents with   Hoarse    Started Thursday with runny nose and nasal congestion. She states she has Asthma, and when she gets sick this happens.    Nasal Congestion    She states that she went to Urgent care last night, and her Mom did not believe the results and advised her to come to see her regular Dr.     HPI Patient is in today for acute visit.  Pt reports she had body aches and sore throat that started Thursday. She then began having coughing, sneezing, nasal congestion on Friday. Coughing and rhinorrhea is worse in the morning. She has hx of asthma and has Albuterol  inhaler and nebulizer. She says she hasn't had to use her inhaler yet today. She was seen at urgent care yesterday. Flu and covid swabs negative. She was given cough syrup rx that she hasn't gotten picked up yet.  She says she had cough syrup left over from another prescription that she's been using, it's helping some. No chest pains but does have some chest tightness.  Review of Systems  HENT:  Positive for congestion. Negative for sore throat.   Respiratory:  Positive for cough and sputum production. Negative for shortness of breath and wheezing.   Cardiovascular:  Negative for chest pain and palpitations.  All other systems reviewed and are negative.      Objective:    BP 119/81   Pulse 83   Temp (!) 97.4 F (36.3 C) (Oral)   Ht 5' 4"$  (1.626 m)   Wt 217 lb 14.4 oz (98.8 kg)   SpO2 99%   BMI 37.40 kg/m    Physical Exam Vitals and nursing note reviewed.  Constitutional:      Appearance: Normal appearance. She is obese.  HENT:     Head: Normocephalic and atraumatic.     Right Ear: Tympanic membrane, ear canal and external ear normal.     Left Ear: Tympanic membrane, ear canal and external ear normal.     Nose: Nose normal.     Mouth/Throat:     Mouth: Mucous  membranes are moist.     Pharynx: Oropharynx is clear.  Eyes:     Conjunctiva/sclera: Conjunctivae normal.     Pupils: Pupils are equal, round, and reactive to light.  Cardiovascular:     Rate and Rhythm: Normal rate and regular rhythm.     Pulses: Normal pulses.     Heart sounds: Normal heart sounds.  Pulmonary:     Effort: Pulmonary effort is normal. No respiratory distress.     Breath sounds: Normal breath sounds. No wheezing.  Abdominal:     General: Abdomen is flat. Bowel sounds are normal.  Skin:    General: Skin is warm.     Capillary Refill: Capillary refill takes less than 2 seconds.  Neurological:     General: No focal deficit present.     Mental Status: She is alert and oriented to person, place, and time. Mental status is at baseline.  Psychiatric:        Mood and Affect: Mood normal.        Behavior: Behavior normal.        Thought Content: Thought content normal.        Judgment: Judgment normal.   No results found for  any visits on 06/18/22.      Assessment & Plan:   Problem List Items Addressed This Visit   None Viral URI with cough -     methylPREDNISolone; 6-day pack as directed  Dispense: 21 tablet; Refill: 0 -     Albuterol Sulfate HFA; Inhale 2 puffs into the lungs every 4 (four) hours as needed for wheezing or shortness of breath.  Dispense: 9 g; Refill: PRN   Flu and covid negative yesterday Advised to get cough syrup filled Honey and tea along with medrol dose pack/refilled albuterol inhaler Symptoms likely consistent with viral URI.   No orders of the defined types were placed in this encounter.   No follow-ups on file.  Leeanne Rio, MD

## 2022-07-03 NOTE — Progress Notes (Signed)
Hi Yolanda Mosley, heart rate patch overall looks good showing mostly sinus rhythm.  They saw some rare early beats called PACs and PVCs.  They said are not harmful and did not occur often.  They can make it feel like your heart is skipping jumping or going fast.  No atrial fibrillation which is good.  Most of the symptoms that you are feeling correlated to just a fast heart rate but no arrhythmia.  So this is actually really reassuring. We could try increasing your your atenolol to control these.

## 2022-07-04 ENCOUNTER — Other Ambulatory Visit: Payer: Self-pay | Admitting: Family Medicine

## 2022-07-04 NOTE — Progress Notes (Signed)
Lets try going up to whole tab on the atenolol. Let see if that helps. She could always skip the Vyvanse one weekend for 2 days and see if her pulse is better, even if just a 10 point differennce.

## 2022-07-19 DIAGNOSIS — M545 Low back pain, unspecified: Secondary | ICD-10-CM | POA: Diagnosis not present

## 2022-07-19 DIAGNOSIS — M5459 Other low back pain: Secondary | ICD-10-CM | POA: Diagnosis not present

## 2022-07-19 DIAGNOSIS — G8911 Acute pain due to trauma: Secondary | ICD-10-CM | POA: Diagnosis not present

## 2022-07-19 DIAGNOSIS — Y92410 Unspecified street and highway as the place of occurrence of the external cause: Secondary | ICD-10-CM | POA: Diagnosis not present

## 2022-07-19 DIAGNOSIS — Z041 Encounter for examination and observation following transport accident: Secondary | ICD-10-CM | POA: Diagnosis not present

## 2022-07-19 DIAGNOSIS — Z91048 Other nonmedicinal substance allergy status: Secondary | ICD-10-CM | POA: Diagnosis not present

## 2022-07-20 ENCOUNTER — Telehealth: Payer: Self-pay | Admitting: General Practice
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
## 2022-07-25 DIAGNOSIS — Z0389 Encounter for observation for other suspected diseases and conditions ruled out: Secondary | ICD-10-CM | POA: Diagnosis not present

## 2022-07-25 DIAGNOSIS — M79604 Pain in right leg: Secondary | ICD-10-CM | POA: Diagnosis not present

## 2022-07-25 DIAGNOSIS — G8911 Acute pain due to trauma: Secondary | ICD-10-CM | POA: Diagnosis not present

## 2022-07-25 DIAGNOSIS — M7989 Other specified soft tissue disorders: Secondary | ICD-10-CM | POA: Diagnosis not present

## 2022-07-25 DIAGNOSIS — M79651 Pain in right thigh: Secondary | ICD-10-CM | POA: Diagnosis not present

## 2022-07-26 ENCOUNTER — Telehealth: Payer: Self-pay

## 2022-07-26 ENCOUNTER — Telehealth: Payer: Self-pay | Admitting: General Practice

## 2022-07-26 NOTE — Telephone Encounter (Signed)
Pt called c/o significant swelling. She went to the ED and r/o a DVT, but she was reaching out regarding compression stockings or f/u on POC.  Reviewed pt's chart, returned call for clarification, no answer, lf vm detailing the 3 mo regimen of exercise, elevation, and compression. If she needs compression stockings, she could come into the office during business hours and get measured.  Pt returned call, two identifiers used. Pt asked if she needed to be seen in office for the new knot. Confirmed that she had a neg DVT US and instructed her to f/u with her PCP since it is not a vascular issue, but may be a cyst or lymph node, etc. Reviewed proper elevation and requested that she come in next week to be measured for compression stockings. Pt to f/u with PCP as well. Confirmed understanding.

## 2022-07-26 NOTE — Transitions of Care (Post Inpatient/ED Visit) (Signed)
   07/26/2022  Name: Yolanda Mosley MRN: [id number] DOB: 08-Jun-1980  Today's TOC FU Call Status: Today's TOC FU Call Status:: Unsuccessul Call (1st Attempt) Unsuccessful Call (1st Attempt) Date: 07/26/22  Attempted to reach the patient regarding the most recent Inpatient/ED visit.  Follow Up Plan: Additional outreach attempts will be made to reach the patient to complete the Transitions of Care (Post Inpatient/ED visit) call.   Signature Tinnie Gens, RN BSN

## 2022-07-30 ENCOUNTER — Telehealth: Payer: Self-pay

## 2022-07-30 DIAGNOSIS — M7989 Other specified soft tissue disorders: Secondary | ICD-10-CM

## 2022-07-30 NOTE — Telephone Encounter (Signed)
Patient present in office today with concerns about her RLE pain and swelling. Pt stated since her previous phone visit with Dr. Donzetta Matters she hasn't had any relief with the knee high compression hose. She states the knee high compression hose are causing her right foot to go numb. She also stated she went back to the ED due to the RLE pain and they advised she does not have a DVT. I measured pt for Thigh High 20-30 compression and she brought them while in office today. I also advised that I would reach out to Dr. Donzetta Matters to see if he recommends pt to have a CTA Abdomen and pelvis to r/o any other venous contributing factors. I advised pt I will be in contact once I have spoken to Dr. Donzetta Matters. Patient voiced her understanding.

## 2022-07-30 NOTE — Transitions of Care (Post Inpatient/ED Visit) (Signed)
   07/30/2022  Name: Yolanda Mosley MRN: [id number] DOB: 10-Mar-1981  Today's TOC FU Call Status: Today's TOC FU Call Status:: Unsuccessful Call (2nd Attempt) Unsuccessful Call (1st Attempt) Date: 07/26/22 Unsuccessful Call (2nd Attempt) Date: 07/30/22  Attempted to reach the patient regarding the most recent Inpatient/ED visit.  Follow Up Plan: Additional outreach attempts will be made to reach the patient to complete the Transitions of Care (Post Inpatient/ED visit) call.   Signature Tinnie Gens, RN BSN

## 2022-07-31 ENCOUNTER — Ambulatory Visit: Payer: BC Managed Care – PPO | Admitting: Family Medicine

## 2022-07-31 NOTE — Transitions of Care (Post Inpatient/ED Visit) (Signed)
   07/31/2022  Name: Yolanda Mosley MRN: [id number] DOB: 07-Jun-1980  Today's TOC FU Call Status: Today's TOC FU Call Status:: Unsuccessful Call (3rd Attempt) Unsuccessful Call (1st Attempt) Date: 07/26/22 Unsuccessful Call (2nd Attempt) Date: 07/30/22 Unsuccessful Call (3rd Attempt) Date: 07/31/22  Attempted to reach the patient regarding the most recent Inpatient/ED visit.  Follow Up Plan: No further outreach attempts will be made at this time. We have been unable to contact the patient.  Signature Tinnie Gens, RN BSN

## 2022-08-07 DIAGNOSIS — I10 Essential (primary) hypertension: Secondary | ICD-10-CM | POA: Diagnosis not present

## 2022-08-07 DIAGNOSIS — R0981 Nasal congestion: Secondary | ICD-10-CM | POA: Diagnosis not present

## 2022-08-07 DIAGNOSIS — R9431 Abnormal electrocardiogram [ECG] [EKG]: Secondary | ICD-10-CM | POA: Diagnosis not present

## 2022-08-07 DIAGNOSIS — R0789 Other chest pain: Secondary | ICD-10-CM | POA: Diagnosis not present

## 2022-08-07 DIAGNOSIS — R000 Tachycardia, unspecified: Secondary | ICD-10-CM | POA: Diagnosis not present

## 2022-08-07 DIAGNOSIS — R079 Chest pain, unspecified: Secondary | ICD-10-CM | POA: Diagnosis not present

## 2022-08-07 DIAGNOSIS — R509 Fever, unspecified: Secondary | ICD-10-CM | POA: Diagnosis not present

## 2022-08-07 DIAGNOSIS — J45909 Unspecified asthma, uncomplicated: Secondary | ICD-10-CM | POA: Diagnosis not present

## 2022-08-07 DIAGNOSIS — R059 Cough, unspecified: Secondary | ICD-10-CM | POA: Diagnosis not present

## 2022-08-07 DIAGNOSIS — J069 Acute upper respiratory infection, unspecified: Secondary | ICD-10-CM | POA: Diagnosis not present

## 2022-08-07 DIAGNOSIS — F988 Other specified behavioral and emotional disorders with onset usually occurring in childhood and adolescence: Secondary | ICD-10-CM | POA: Diagnosis not present

## 2022-08-09 ENCOUNTER — Other Ambulatory Visit (HOSPITAL_COMMUNITY): Payer: Self-pay

## 2022-08-14 ENCOUNTER — Ambulatory Visit (INDEPENDENT_AMBULATORY_CARE_PROVIDER_SITE_OTHER): Payer: BC Managed Care – PPO | Admitting: Family Medicine

## 2022-08-14 ENCOUNTER — Encounter: Payer: Self-pay | Admitting: Family Medicine

## 2022-08-14 VITALS — BP 113/67 | HR 83 | Ht 64.0 in | Wt 219.0 lb

## 2022-08-14 DIAGNOSIS — R058 Other specified cough: Secondary | ICD-10-CM

## 2022-08-14 DIAGNOSIS — J4530 Mild persistent asthma, uncomplicated: Secondary | ICD-10-CM | POA: Diagnosis not present

## 2022-08-14 DIAGNOSIS — I10 Essential (primary) hypertension: Secondary | ICD-10-CM | POA: Diagnosis not present

## 2022-08-14 DIAGNOSIS — F902 Attention-deficit hyperactivity disorder, combined type: Secondary | ICD-10-CM

## 2022-08-14 MED ORDER — LISDEXAMFETAMINE DIMESYLATE 70 MG PO CAPS: 70.0000 mg | ORAL_CAPSULE | Freq: Every day | ORAL | 0 refills | Status: DC

## 2022-08-14 MED ORDER — PREDNISONE 20 MG PO TABS
40.0000 mg | ORAL_TABLET | Freq: Every day | ORAL | 0 refills | Status: DC
Start: 2022-08-14 — End: 2022-09-18

## 2022-08-14 NOTE — Progress Notes (Signed)
Established Patient Office Visit  Subjective   Patient ID: Yolanda Mosley, female    DOB: 11-29-1980  Age: 42 y.o. MRN: [id number]  No chief complaint on file.   HPI   ADD - Reports symptoms are well controlled on current regime. Denies any problems with insomnia, chest pain, palpitations, or SOB.    She was seen in urgent care on April nights about 7 days ago for cough, congestion, body aches and headache.  She says she never had a fever..  They felt like it was most consistent with a viral URI.  Still complaining of c ough and runny nose. Taking Zyrtec.  She says that the body aches and headaches are gone.  But the cough which was producing clear mucus is now more white and yellow.  Some right ear pressure.  Is currently taking Zyrtec she takes it year-round.  She was taking Sudafed in the morning and NyQuil at night but stopped about 2 days ago.    ROS    Objective:     BP 113/67   Pulse 83   Ht  (1.626 m)   Wt 219 lb (99.3 kg)   SpO2 97%   BMI 37.59 kg/m    Physical Exam Vitals and nursing note reviewed.  Constitutional:      Appearance: She is well-developed.  HENT:     Head: Normocephalic and atraumatic.  Neck:     Vascular: No carotid bruit.  Cardiovascular:     Rate and Rhythm: Normal rate and regular rhythm.     Heart sounds: Normal heart sounds.  Pulmonary:     Effort: Pulmonary effort is normal.     Breath sounds: Normal breath sounds.  Musculoskeletal:     Cervical back: Neck supple. No tenderness.  Lymphadenopathy:     Cervical: No cervical adenopathy.  Skin:    General: Skin is warm and dry.  Neurological:     Mental Status: She is alert and oriented to person, place, and time.  Psychiatric:        Behavior: Behavior normal.      No results found for any visits on 08/14/22.    The 10-year ASCVD risk score (Arnett DK, et al., 2019) is: 0.6%    Assessment & Plan:   Problem List Items Addressed This Visit       Cardiovascular and  Mediastinum   Essential hypertension    Pressure looks fantastic today!        Respiratory   Asthma    She does have slight wheezing in her chest.  Again we will treat with prednisone.  Call if not better.  Use albuterol as needed.      Relevant Medications   predniSONE (DELTASONE) 20 MG tablet     Other   ADHD - Primary    Well controlled. Continue current regimen. Follow up in  48mo       Relevant Medications   lisdexamfetamine (VYVANSE) 70 MG capsule   lisdexamfetamine (VYVANSE) 70 MG capsule (Start on 09/13/2022)   lisdexamfetamine (VYVANSE) 70 MG capsule (Start on 10/13/2022)   Other Visit Diagnoses     Post-viral cough syndrome       Relevant Medications   predniSONE (DELTASONE) 20 MG tablet      *Cough syndrome-discussed treatment with prednisone.  If she is not feeling better, or suddenly feels worse just let us know.  Return in about 6 months (around 02/13/2023) for ADD meds.    Nani Gasser, MD

## 2022-08-14 NOTE — Assessment & Plan Note (Signed)
She does have slight wheezing in her chest.  Again we will treat with prednisone.  Call if not better.  Use albuterol as needed.

## 2022-08-14 NOTE — Assessment & Plan Note (Signed)
Pressure looks fantastic today. 

## 2022-08-14 NOTE — Assessment & Plan Note (Signed)
Well controlled. Continue current regimen. Follow up in  6 mo  

## 2022-08-17 ENCOUNTER — Other Ambulatory Visit: Payer: Self-pay | Admitting: Family Medicine

## 2022-08-22 ENCOUNTER — Ambulatory Visit: Payer: BC Managed Care – PPO | Admitting: Vascular Surgery

## 2022-08-22 ENCOUNTER — Encounter: Payer: Self-pay | Admitting: Vascular Surgery

## 2022-08-22 ENCOUNTER — Ambulatory Visit (HOSPITAL_COMMUNITY)
Admission: RE | Admit: 2022-08-22 | Discharge: 2022-08-22 | Disposition: A | Discharge status: Home / Self Care | Payer: BC Managed Care – PPO | Source: Ambulatory Visit | Attending: Vascular Surgery | Admitting: Vascular Surgery

## 2022-08-22 VITALS — BP 113/78 | HR 78 | Temp 97.9°F | Resp 20 | Ht 64.0 in | Wt 223.0 lb

## 2022-08-22 DIAGNOSIS — R600 Localized edema: Secondary | ICD-10-CM

## 2022-08-22 DIAGNOSIS — M7989 Other specified soft tissue disorders: Secondary | ICD-10-CM | POA: Diagnosis not present

## 2022-08-22 NOTE — Progress Notes (Signed)
Patient ID: Yolanda Mosley, female   DOB: 31-Aug-1980, 42 y.o.   MRN: [id number]  Reason for Consult: Follow-up   Referred by Yolanda Mosley, *  Subjective:     HPI:  Yolanda Mosley is a 42 y.o. female with history of pelvic cancer and ultimately had chemotherapy.  I had a phone visit with her 2 months ago for worsening swelling of her right lower extremity that have been ongoing for 6 to 8 months without any personal or family history of DVT.  She had reflux study at that time which was negative for any reflux and returns again for reflux testing today.  She has been wearing thigh-high compression stockings but states that they really do not help with the swelling only help occasional with the groin pain that is associated with the swelling.  Swelling was initially in the foot and then progressed to below the knee and now at the thigh level and is located only on the right.  She is never had any right lower extremity injuries or surgeries.  Past Medical History:  Diagnosis Date   ADHD 03/01/2011   ALLERGIC RHINITIS 01/09/2010   Qualifier: Diagnosis of  By: Linford Arnold MD, Santina Evans     Allergy    Anemia    Anemia, iron deficiency 09/09/2013   Asthma    Bilateral low back pain without sciatica 08/10/2014   CA - cancer 2005   Germ Cell Tumor - Rt Ovary   DDD (degenerative disc disease), lumbar 08/11/2014   L5 and S1 via xrays 07/2014.     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  CPAP 7 cm water pressure.  Mild OSA AHI 8.7.    Ovarian cyst    left   Peripheral edema 10/22/2016   Post chemo evaluation 04/04/2015   Sleep apnea    Tachycardia    Venous stasis dermatitis of both lower extremities 10/23/2016   Family History  Problem Relation Age of Onset   Hypertension Mother    Diabetes Other    Other Other        cardiovascular disorder   Past Surgical History:  Procedure Laterality Date   LEFT HEART CATH AND CORONARY ANGIOGRAPHY N/A 07/14/2020   Procedure: LEFT HEART CATH AND CORONARY ANGIOGRAPHY;  Surgeon: Lennette Bihari, MD;  Location: [facility];  Service: Cardiovascular;  Laterality: N/A;   OMENTECTOMY  2005   ovary, bilater fallopian tubes removed  2005   SALPINGOOPHORECTOMY Right     Short Social History:  Social History   Tobacco Use   Smoking status: Never  Smokeless tobacco: Never  Substance Use Topics   Alcohol use: No    Allergies  Allergen Reactions   Ibuprofen     Severe bleeding   Other Rash   Hydrocodone Other (See Comments)    Dizzy , nauseous, swimmy headed.(Can take with phenergan)   Keflex [Cephalexin]     Diarrhea and more abdominal cramping    Codeine Nausea Only   Tape Rash    Plastic tape, tegaderm    Wound Dressing Adhesive Rash    Plastic tape, tegaderm     Current Outpatient Medications  Medication Sig Dispense Refill   albuterol (VENTOLIN HFA) 108 (90 Base) MCG/ACT inhaler Inhale 2 puffs into the lungs every 4 (four) hours as needed for wheezing or shortness of breath. 9 g PRN   atenolol (TENORMIN) 50 MG tablet Take 1/2 (one-half) tablet by mouth once daily 45 tablet 1    benazepril-hydrochlorthiazide (LOTENSIN HCT) 20-12.5 MG tablet Take 2 tablets by mouth daily. 180 tablet 3   cetirizine (ZYRTEC) 10 MG tablet Take 10 mg by mouth daily.     diltiazem (CARDIZEM CD) 120 MG 24 hr capsule Take 1 capsule by mouth once daily 90 capsule 0   lisdexamfetamine (VYVANSE) 70 MG capsule Take 1 capsule (70 mg total) by mouth daily. 30 capsule 0   [START ON 09/13/2022] lisdexamfetamine (VYVANSE) 70 MG capsule Take 1 capsule (70 mg total) by mouth daily before breakfast. 30 capsule 0   [START ON 10/13/2022] lisdexamfetamine (VYVANSE) 70 MG capsule Take 1 capsule (70 mg total) by mouth daily. 30 capsule 0   predniSONE (DELTASONE) 20 MG tablet Take 2 tablets (40 mg total) by mouth daily with breakfast. 10 tablet 0   No current facility-administered medications for this visit.    Review of Systems  Constitutional:  Constitutional negative. HENT: HENT negative.  Eyes: Eyes negative.  Respiratory: Respiratory negative.  Cardiovascular: Positive for leg swelling.  GI: Gastrointestinal negative.  Musculoskeletal: Positive for leg pain.  Skin: Skin negative.  Neurological: Neurological negative. Hematologic: Hematologic/lymphatic negative.  Psychiatric: Psychiatric negative.        Objective:  Objective   Vitals:   08/22/22 1337  BP: 113/78  Pulse: 78  Resp: 20  Temp: 97.9 F (36.6 C)  SpO2: 96%  Weight: 223 lb (101.2 kg)  Height: 5\' 4"  (1.626 m)   Body mass index is 38.28 kg/m.  Physical Exam HENT:     Head: Normocephalic.     Nose: Nose normal.  Eyes:     Pupils: Pupils are equal, round, and reactive to light.  Cardiovascular:     Rate and Rhythm: Normal rate.     Pulses: Normal pulses.  Pulmonary:     Effort: Pulmonary effort is normal.  Abdominal:     General: Abdomen is flat.  Musculoskeletal:     Cervical back: Normal range of motion and neck supple.     Right lower leg: Edema present.     Left lower leg: No edema.     Comments: Right lower  extremity measures 47.5 cm left is 45 cm both measured 10 cm from tibial tuberosity  Swelling in the right lower extremity extends into the right thigh  Skin:    Capillary Refill: Capillary refill takes less than 2 seconds.  Neurological:     General: No focal deficit present.     Mental Status: She is alert.  Psychiatric:        Mood and Affect: Mood normal.  Thought Content: Thought content normal.     Data: Venous Reflux Times  +-----------------------+---------+------+----------+-----------+----------  ----+  RIGHT                 Reflux NoReflux  Reflux   Diameter  Comments                                          Yes     Time       cms                     +-----------------------+---------+------+----------+-----------+----------  ----+  CFV                   no                                                   +-----------------------+---------+------+----------+-----------+----------  ----+  FV prox                          yes  >1 second                             +-----------------------+---------+------+----------+-----------+----------  ----+  FV mid                 no                                                   +-----------------------+---------+------+----------+-----------+----------  ----+  FV dist                no                                   duplicated                                                                 vein             +-----------------------+---------+------+----------+-----------+----------  ----+  Popliteal             no                                                   +-----------------------+---------+------+----------+-----------+----------  ----+  GSV at [facility]                       yes   >500 ms     0.63                     +-----------------------+---------+------+----------+-----------+----------  ----+  GSV prox thigh  yes    >500 ms     0.47    branching        +-----------------------+---------+------+----------+-----------+----------  ----+  GSV mid thigh                    yes   >500 ms     0.33    branching        +-----------------------+---------+------+----------+-----------+----------  ----+  GSV dist thigh                   yes   >500 ms     0.41                     +-----------------------+---------+------+----------+-----------+----------  ----+  GSV at knee            no                          0.32                     +-----------------------+---------+------+----------+-----------+----------  ----+  GSV prox calf                    yes   >500 ms     0.22                     +-----------------------+---------+------+----------+-----------+----------  ----+  SSV Pop Fossa          no                          0.22                     +-----------------------+---------+------+----------+-----------+----------  ----+  SSV prox calf          no                          0.18                     +-----------------------+---------+------+----------+-----------+----------  ----+  AASV Prox Thigh                  yes   >500 ms     0.52                     +-----------------------+---------+------+----------+-----------+----------  ----+  AASV Mid Thigh                   yes   >500 ms     0.29                     +-----------------------+---------+------+----------+-----------+----------  ----+  AASV Distal Thigh      no                                                   lateral                                                                     +-----------------------+---------+------+----------+-----------+----------  ----+  AASV Distal Thigh                yes   >500 ms     0.36                     Medial                                                                       +-----------------------+---------+------+----------+-----------+----------  ----+        Summary:  Right:  - No evidence of deep vein thrombosis seen in the right lower extremity,  from the common femoral through the popliteal veins.  - No evidence of superficial venous thrombosis in the right lower  extremity.    - No evidence of superficial venous reflux seen in the right short  saphenous vein.    - Venous reflux is noted in the right common femoral vein.  - Venous reflux is noted in the right profunda femoral vein.  - Venous reflux is noted in the right sapheno-femoral junction.  - Venous reflux is noted in the right greater saphenous vein in the thigh.  - Venous reflux is noted in the right greater saphenous vein in the calf.  - Venous reflux is noted in the right femoral vein.  - Venous reflux is noted in the right anterior accessory saphenous vein.      Assessment/Plan:    42 year old female with history of previous pelvic surgery on 2 separate occasions with known scar tissue and subsequent chemotherapy but no radiation now with right lower extremity greater than left lower extremity swelling really occurs from the thigh down with reflux in her right great saphenous vein but I do not think accounts for all of her edema particularly given that it is from thigh down.  I recommended continued compression stockings although it does not help with her edema it has alleviated some of her pain particularly in the groin.  Given her history I think it is prudent to begin with a CT venogram to evaluate for proximal obstruction.  If the CT venogram is negative for any proximal obstructive process we could consider great saphenous vein ablation and also lymphedema pumps given her history of pelvic surgery.  She demonstrates understanding we will get a CT venogram and follow her up in a few weeks.     Maeola Harman MD [facility]

## 2022-08-23 ENCOUNTER — Other Ambulatory Visit: Payer: Self-pay | Admitting: Family Medicine

## 2022-08-23 DIAGNOSIS — Z1231 Encounter for screening mammogram for malignant neoplasm of breast: Secondary | ICD-10-CM

## 2022-08-24 ENCOUNTER — Other Ambulatory Visit: Payer: Self-pay

## 2022-08-24 DIAGNOSIS — I889 Nonspecific lymphadenitis, unspecified: Secondary | ICD-10-CM

## 2022-08-28 ENCOUNTER — Encounter: Payer: Self-pay | Admitting: Family Medicine

## 2022-08-29 MED ORDER — MUPIROCIN 2 % EX OINT: TOPICAL_OINTMENT | Freq: Two times a day (BID) | CUTANEOUS | 0 refills | Status: DC

## 2022-09-04 ENCOUNTER — Ambulatory Visit (INDEPENDENT_AMBULATORY_CARE_PROVIDER_SITE_OTHER): Payer: BC Managed Care – PPO

## 2022-09-04 ENCOUNTER — Encounter (HOSPITAL_COMMUNITY): Payer: BC Managed Care – PPO

## 2022-09-14 ENCOUNTER — Ambulatory Visit
Admission: RE | Admit: 2022-09-14 | Discharge: 2022-09-14 | Disposition: A | Discharge status: Home / Self Care | Payer: BC Managed Care – PPO | Source: Ambulatory Visit | Attending: Vascular Surgery | Admitting: Vascular Surgery

## 2022-09-14 ENCOUNTER — Encounter: Payer: Self-pay | Admitting: Family Medicine

## 2022-09-14 DIAGNOSIS — G500 Trigeminal neuralgia: Secondary | ICD-10-CM | POA: Diagnosis not present

## 2022-09-14 DIAGNOSIS — R600 Localized edema: Secondary | ICD-10-CM | POA: Diagnosis not present

## 2022-09-14 DIAGNOSIS — I639 Cerebral infarction, unspecified: Secondary | ICD-10-CM | POA: Diagnosis not present

## 2022-09-14 DIAGNOSIS — N83202 Unspecified ovarian cyst, left side: Secondary | ICD-10-CM | POA: Diagnosis not present

## 2022-09-14 DIAGNOSIS — I10 Essential (primary) hypertension: Secondary | ICD-10-CM | POA: Diagnosis not present

## 2022-09-14 DIAGNOSIS — R200 Anesthesia of skin: Secondary | ICD-10-CM | POA: Diagnosis not present

## 2022-09-14 DIAGNOSIS — I889 Nonspecific lymphadenitis, unspecified: Secondary | ICD-10-CM

## 2022-09-14 DIAGNOSIS — R202 Paresthesia of skin: Secondary | ICD-10-CM | POA: Diagnosis not present

## 2022-09-14 DIAGNOSIS — R4182 Altered mental status, unspecified: Secondary | ICD-10-CM | POA: Diagnosis not present

## 2022-09-14 MED ORDER — IOPAMIDOL (ISOVUE-370) INJECTION 76%
100.0000 mL | Freq: Once | INTRAVENOUS | Status: AC | PRN
  Administered 2022-09-14: 100 mL via INTRAVENOUS

## 2022-09-14 NOTE — Telephone Encounter (Signed)
Yes needs an appt

## 2022-09-16 DIAGNOSIS — R200 Anesthesia of skin: Secondary | ICD-10-CM | POA: Diagnosis not present

## 2022-09-16 DIAGNOSIS — R531 Weakness: Secondary | ICD-10-CM | POA: Diagnosis not present

## 2022-09-16 DIAGNOSIS — R29810 Facial weakness: Secondary | ICD-10-CM | POA: Diagnosis not present

## 2022-09-17 ENCOUNTER — Other Ambulatory Visit: Payer: Self-pay | Admitting: Family Medicine

## 2022-09-17 ENCOUNTER — Telehealth: Payer: Self-pay | Admitting: General Practice

## 2022-09-17 NOTE — Transitions of Care (Post Inpatient/ED Visit) (Signed)
   09/17/2022  Name: Yolanda Mosley MRN: [id number] DOB: 1980/09/11  Today's TOC FU Call Status: TOC FU Call Complete Date: 09/17/22  Transition Care Management Follow-up Telephone Call Date of Discharge: 09/17/22 Discharge Facility: Other ([facility]) Name of Other ([facility]) Discharge Facility: [facility] Type of Discharge: Emergency Department Reason for ED Visit: Neurologic Neurologic Diagnosis:  (?bells palsy) How have you been since you were released from the hospital?: Same Any questions or concerns?: No  Items Reviewed: Did you receive and understand the discharge instructions provided?: Yes Medications obtained,verified, and reconciled?: Yes (Medications Reviewed) Any new allergies since your discharge?: No Dietary orders reviewed?: NA Do you have support at home?: Yes  Medications Reviewed Today: Medications Reviewed Today     Reviewed by Modesto Charon, RN (Registered Nurse) on 09/17/22 at 1108  Med List Status: <None>   Medication Order Taking? Sig Documenting Provider Last Dose Status Informant  albuterol (VENTOLIN HFA) 108 (90 Base) MCG/ACT inhaler [id number] No Inhale 2 puffs into the lungs every 4 (four) hours as needed for wheezing or shortness of breath. Suzan Slick, MD Taking Active   atenolol (TENORMIN) 50 MG tablet [id number] No Take 1/2 (one-half) tablet by mouth once daily Agapito Games, MD Taking Active   benazepril-hydrochlorthiazide (LOTENSIN HCT) 20-12.5 MG tablet [id number] No Take 2 tablets by mouth daily. Monica Becton, MD Taking Active   cetirizine (ZYRTEC) 10 MG tablet [id number] No Take 10 mg by mouth daily. [provider] Taking Active Self  diltiazem (CARDIZEM CD) 120 MG 24 hr capsule [id number] No Take 1 capsule by mouth once daily Agapito Games, MD Taking Active   lisdexamfetamine (VYVANSE) 70 MG capsule [id number] No Take 1 capsule (70 mg total) by mouth daily. Agapito Games, MD  Taking Active   lisdexamfetamine (VYVANSE) 70 MG capsule [id number] No Take 1 capsule (70 mg total) by mouth daily before breakfast. Agapito Games, MD Taking Active   lisdexamfetamine (VYVANSE) 70 MG capsule [id number] No Take 1 capsule (70 mg total) by mouth daily. Agapito Games, MD Taking Active   mupirocin ointment (BACTROBAN) 2 % [id number]  Apply topically 2 (two) times daily. In the nose X 10 days Agapito Games, MD  Active   predniSONE (DELTASONE) 20 MG tablet [id number] No Take 2 tablets (40 mg total) by mouth daily with breakfast. Agapito Games, MD Taking Active             Home Care and Equipment/Supplies: Were Home Health Services Ordered?: NA Any new equipment or medical supplies ordered?: NA  Functional Questionnaire: Do you need assistance with bathing/showering or dressing?: No Do you need assistance with meal preparation?: No Do you need assistance with eating?: No Do you have difficulty maintaining continence: No Do you need assistance with getting out of bed/getting out of a chair/moving?: No Do you have difficulty managing or taking your medications?: No  Follow up appointments reviewed: PCP Follow-up appointment confirmed?: Yes Date of PCP follow-up appointment?: 09/18/22 Follow-up Provider: Dr. Linford Arnold Specialist [facility] Follow-up appointment confirmed?: NA Do you need transportation to your follow-up appointment?: No Do you understand care options if your condition(s) worsen?: Yes-patient verbalized understanding    SIGNATURE Modesto Charon, RN BSN

## 2022-09-18 ENCOUNTER — Encounter: Payer: Self-pay | Admitting: Family Medicine

## 2022-09-18 ENCOUNTER — Telehealth: Payer: Self-pay | Admitting: Family Medicine

## 2022-09-18 ENCOUNTER — Ambulatory Visit: Payer: BC Managed Care – PPO | Admitting: Family Medicine

## 2022-09-18 VITALS — BP 100/55 | HR 81 | Ht 64.0 in | Wt 222.0 lb

## 2022-09-18 DIAGNOSIS — R200 Anesthesia of skin: Secondary | ICD-10-CM

## 2022-09-18 DIAGNOSIS — R29810 Facial weakness: Secondary | ICD-10-CM

## 2022-09-18 DIAGNOSIS — R220 Localized swelling, mass and lump, head: Secondary | ICD-10-CM

## 2022-09-18 MED ORDER — METHYLPREDNISOLONE ACETATE 40 MG/ML IJ SUSP
40.0000 mg | Freq: Once | INTRAMUSCULAR | Status: DC
Start: 2022-09-18 — End: 2023-06-18

## 2022-09-18 MED ORDER — AMOXICILLIN-POT CLAVULANATE 875-125 MG PO TABS
1.0000 | ORAL_TABLET | Freq: Two times a day (BID) | ORAL | 0 refills | Status: DC
Start: 2022-09-18 — End: 2022-10-31

## 2022-09-18 NOTE — Telephone Encounter (Signed)
Patient and let her know I did look back at her scan reports.  They did notice some fluid in her left sinus stomach she can go ahead and put her on an antibiotic as well.

## 2022-09-18 NOTE — Progress Notes (Signed)
Acute Office Visit  Subjective:     Patient ID: Yolanda Mosley, female    DOB: 03-Jan-1981, 42 y.o.   MRN: [id number]  Chief Complaint  Patient presents with   Follow-up    Pt reports that she continues to have some L sided facial numbness that comes and goes.  She was advised to get a referral for Neuro about this she would like to see someone in [place]    HPI Patient is in today for ED follow-up from May 17 where she was seen in the emergency room at [facility].  She presented initially with facial numbness and a cold sensation on her left lower jaw.  She had a negative head CT without contrast and then a CTA of the head and neck.  CBC was normal.    Pt reports that she continues to have some L sided facial numbness that comes and goes.  She was advised to get a referral for Neuro about this she would like to see someone in [place]   That she had a similar episode about a month before this occurred on the same side.  She denies any ear pain no recent cold symptoms.  She says the sensation keeps changing sometimes it feels cold and sometimes it is more aching or throbbing or pulsating.  Sometimes 1 electrical shock sensation.  She does feel that the sensation has become less intense.  She was always able to move her forehead and puff out her cheeks.  Also interestingly she noticed a few petechiae on her left palm only.  She noticed a few new spots and then the other ones seem to have faded.  No injury or trauma.  ROS      Objective:    BP (!) 100/55   Pulse 81   Ht 5\' 4"  (1.626 m)   Wt 222 lb (100.7 kg)   SpO2 98%   BMI 38.11 kg/m    Physical Exam Vitals reviewed.  Constitutional:      Appearance: She is well-developed.  HENT:     Head: Normocephalic and atraumatic.     Right Ear: Tympanic membrane, ear canal and external ear normal.     Left Ear: Tympanic membrane, ear canal and external ear normal.     Nose: Nose normal.     Mouth/Throat:     Pharynx: Oropharynx is  clear.  Eyes:     Conjunctiva/sclera: Conjunctivae normal.  Neck:     Comments: Have a mildly swollen left anterior cervical lymph node but it does not feel greater than a centimeter and it is not hard or have any abnormal features.  Nontender over the jaw or the TMJ joint.  No popping or cracking. Cardiovascular:     Rate and Rhythm: Normal rate.  Pulmonary:     Effort: Pulmonary effort is normal.  Musculoskeletal:     Cervical back: Neck supple. No rigidity or tenderness.  Skin:    General: Skin is dry.     Coloration: Skin is not pale.  Neurological:     General: No focal deficit present.     Mental Status: She is alert and oriented to person, place, and time.     Cranial Nerves: No cranial nerve deficit.  Psychiatric:        Behavior: Behavior normal.     No results found for any visits on 09/18/22.      Assessment & Plan:   Problem List Items Addressed This Visit   None  Visit Diagnoses     Facial weakness    -  Primary   Relevant Orders   Ambulatory referral to Neurology   Facial numbness       Relevant Orders   Ambulatory referral to Neurology   Left facial swelling       Relevant Medications   methylPREDNISolone acetate (DEPO-MEDROL) injection 40 mg (Start on 09/18/2022 12:30 PM)      Unclear etiology.  Today she is having normal function and movement of her face.  She is still getting some intermittent sensations on the left side of that jaw.  Recent calcium levels were normal.  She did feel like there was maybe a little swelling or fullness.  We did discuss the possibility of a steroid to see if it is helpful.  The area affected is predominantly the tubular branch of the trigeminal nerve.  The only thing seen on her CT of the head and CT angio was small amount of layering fluid in the posterior left maxillary sinus with some wall thickening.  It is possible that this could indicate an acute bacterial infection.  So we will treat with antibiotic and prednisone and  see if symptoms resolve.  Though it is interesting it happened about a month prior so we will go ahead and place neurology referral as well   Meds ordered this encounter  Medications   methylPREDNISolone acetate (DEPO-MEDROL) injection 40 mg   amoxicillin-clavulanate (AUGMENTIN) 875-125 MG tablet    Sig: Take 1 tablet by mouth 2 (two) times daily.    Dispense:  14 tablet    Refill:  0    Return if symptoms worsen or fail to improve.  Nani Gasser, MD

## 2022-09-18 NOTE — Telephone Encounter (Signed)
Patient informed. 

## 2022-09-19 ENCOUNTER — Encounter: Payer: Self-pay | Admitting: Vascular Surgery

## 2022-09-19 ENCOUNTER — Ambulatory Visit (INDEPENDENT_AMBULATORY_CARE_PROVIDER_SITE_OTHER): Payer: BC Managed Care – PPO | Admitting: Vascular Surgery

## 2022-09-19 VITALS — BP 111/77 | HR 72 | Temp 97.9°F | Resp 20 | Ht 64.0 in | Wt 221.0 lb

## 2022-09-19 DIAGNOSIS — R600 Localized edema: Secondary | ICD-10-CM | POA: Diagnosis not present

## 2022-09-19 DIAGNOSIS — I890 Lymphedema, not elsewhere classified: Secondary | ICD-10-CM

## 2022-09-19 NOTE — Progress Notes (Signed)
Patient ID: Yolanda Mosley, female   DOB: 18-Dec-1980, 42 y.o.   MRN: [id number]  Reason for Consult: Follow-up   Referred by Agapito Games, *  Subjective:     HPI:  Yolanda Mosley is a 42 y.o. female with history of pelvic cancer treated with chemotherapy and has now almost 1 year history of right lower extremity greater than left lower extremity swelling.  She has undergone reflux testing.  She is wearing thigh-high compression stockings.  She continues to work on her feet at [employer] 5 days a week.  She denies any improvement with compression stockings.  She does have some discoloration of the skin of the right lower leg relative to the left.  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  She denies any improvements despite exercising, elevation and compression stockings.    She has had some swelling and numbness of the left side of her face and has undergone workup for this and began steroid therapy which she states is helping.  Past Medical History:  Diagnosis Date   ADHD 03/01/2011   ALLERGIC RHINITIS 01/09/2010   Qualifier: Diagnosis of  By: Linford Arnold MD, Santina Evans     Allergy    Anemia    Anemia, iron deficiency 09/09/2013   Asthma    Bilateral low back pain without sciatica 08/10/2014   CA - cancer 2005   Germ Cell Tumor - Rt Ovary   DDD (degenerative disc disease), lumbar 08/11/2014   L5 and S1 via xrays 07/2014.     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  CPAP 7 cm water pressure.  Mild OSA AHI 8.7.    Ovarian cyst    left   Peripheral edema 10/22/2016   Post chemo evaluation 04/04/2015   Sleep apnea    Tachycardia    Venous stasis dermatitis of both lower extremities 10/23/2016   Family History  Problem Relation Age of Onset   Hypertension Mother    Diabetes Other    Other Other        cardiovascular disorder   Past Surgical History:  Procedure Laterality Date   LEFT HEART CATH AND CORONARY ANGIOGRAPHY N/A 07/14/2020   Procedure: LEFT HEART CATH AND CORONARY ANGIOGRAPHY;  Surgeon: Lennette Bihari, MD;  Location: [facility]VASIVE [facility];  Service: Cardiovascular;  Laterality: N/A;   OMENTECTOMY  2005   ovary, bilater fallopian tubes removed  2005   SALPINGOOPHORECTOMY Right     Short Social History:  Social  History   Tobacco Use   Smoking status: Never   Smokeless tobacco: Never  Substance Use Topics   Alcohol use: No    Allergies  Allergen Reactions   Ibuprofen     Severe bleeding   Other Rash   Hydrocodone Other (See Comments)    Dizzy , nauseous, swimmy headed.(Can take with phenergan)   Keflex [Cephalexin]     Diarrhea and more abdominal cramping    Codeine Nausea Only   Tape Rash    Plastic tape, tegaderm    Wound Dressing Adhesive Rash    Plastic tape, tegaderm     Current Outpatient Medications  Medication Sig Dispense Refill   albuterol (VENTOLIN HFA) 108 (90 Base) MCG/ACT inhaler Inhale 2 puffs into the lungs every 4 (four) hours as needed for wheezing or shortness of breath. 9 g PRN    amoxicillin-clavulanate (AUGMENTIN) 875-125 MG tablet Take 1 tablet by mouth 2 (two) times daily. 14 tablet 0   atenolol (TENORMIN) 50 MG tablet Take 1/2 (one-half) tablet by mouth once daily 45 tablet 1   benazepril-hydrochlorthiazide (LOTENSIN HCT) 20-12.5 MG tablet Take 2 tablets by mouth daily. 180 tablet 3   CARTIA XT 120 MG 24 hr capsule Take 1 capsule by mouth once daily 90 capsule 0   cetirizine (ZYRTEC) 10 MG tablet Take 10 mg by mouth daily.     lisdexamfetamine (VYVANSE) 70 MG capsule Take 1 capsule (70 mg total) by mouth daily. 30 capsule 0   lisdexamfetamine (VYVANSE) 70 MG capsule Take 1 capsule (70 mg total) by mouth daily before breakfast. 30 capsule 0   [START ON 10/13/2022] lisdexamfetamine (VYVANSE) 70 MG capsule Take 1 capsule (70 mg total) by mouth daily. 30 capsule 0   Current Facility-Administered Medications  Medication Dose Route Frequency Provider Last Rate Last Admin   methylPREDNISolone acetate (DEPO-MEDROL) injection 40 mg  40 mg Intramuscular Once Agapito Games, MD        Review of Systems  Constitutional:  Constitutional negative. HENT:       A swelling on the left Eyes: Eyes negative.  Cardiovascular: Positive for leg swelling.  Musculoskeletal: Musculoskeletal negative.  Skin: Skin negative.  Neurological: Neurological negative. Psychiatric: Psychiatric negative.        Objective:  Objective   Vitals:   09/19/22 0819  BP: 111/77  Pulse: 72  Resp: 20  Temp: 97.9 F (36.6 C)  SpO2: 97%      Physical Exam HENT:     Head: Normocephalic.     Nose: Nose normal.     Mouth/Throat:     Mouth: Mucous membranes are moist.  Eyes:     Pupils: Pupils are equal, round, and reactive to light.  Cardiovascular:     Rate and Rhythm: Normal rate.  Abdominal:     General: Abdomen is flat.  Musculoskeletal:     Right lower leg: Edema present.     Left lower leg: No edema.  Skin:    Capillary Refill: Capillary refill takes less than 2  seconds.  Neurological:     General: No focal deficit present.     Mental Status: She is alert.  Psychiatric:        Mood and Affect: Mood normal.   Right lower extremity measures 48 cm left is 45 cm both measured 10 cm from tibial tuberosity     Data: CT venogram IMPRESSION: 1. Normal CT venogram of the abdomen, pelvis and bilateral proximal lower extremities. 2. Enlarged left ovary containing  several cysts as demonstrated by prior ultrasound and MRI. The largest single cystic area measures approximately 4.5 cm in diameter on coronal imaging which is similar to dimensions obtained by MRI. Appearance by CT is not as detailed or specific as prior MRI or ultrasound studies. No associated free fluid in the pelvis. 3. Degenerative disc disease at L5-S1.  We reviewed her CT scan together today.     Assessment/Plan:    42 year old female with right lower extremity edema with a history of pelvic cancer.  She does have pain of the leg and skin changes of the lower leg.  She has 5% increased swelling on the right relative to the left below the knee but also has significant thigh edema.    She has been exercising and elevates the leg when she is recumbent and has also been compliant with compression stockings for the past 1 month.  She does not have any central venous stenosis or compressive symptoms by CT scan to suggest venous obstructive process.  She does have venous reflux but I do not think accounts for all of her swelling which is more than likely related to lymphedema with concomitant venous reflux.  We have discussed the need for lymphedema pumps to better control her edema and she will follow-up in a few months with repeat venous reflux testing if she does not have significant improvement with continued compression and lymphedema pumps we can consider ablation of her lower extremity veins where she has branching with an anterior excessively saphenous vein as well as great saphenous vein  in the early thigh both of which have reflux by previous duplex.     Maeola Harman MD [facility]

## 2022-09-25 ENCOUNTER — Ambulatory Visit: Payer: BC Managed Care – PPO | Admitting: Family Medicine

## 2022-09-26 ENCOUNTER — Ambulatory Visit: Payer: BC Managed Care – PPO

## 2022-09-27 ENCOUNTER — Other Ambulatory Visit: Payer: Self-pay

## 2022-09-27 DIAGNOSIS — M7989 Other specified soft tissue disorders: Secondary | ICD-10-CM

## 2022-09-27 DIAGNOSIS — I890 Lymphedema, not elsewhere classified: Secondary | ICD-10-CM

## 2022-10-11 ENCOUNTER — Ambulatory Visit (INDEPENDENT_AMBULATORY_CARE_PROVIDER_SITE_OTHER): Payer: BC Managed Care – PPO

## 2022-10-11 DIAGNOSIS — Z1231 Encounter for screening mammogram for malignant neoplasm of breast: Secondary | ICD-10-CM | POA: Diagnosis not present

## 2022-10-15 NOTE — Progress Notes (Signed)
Please call patient. Normal mammogram.  Repeat in 1 year.  

## 2022-10-31 ENCOUNTER — Other Ambulatory Visit: Payer: Self-pay | Admitting: Family Medicine

## 2022-10-31 DIAGNOSIS — F902 Attention-deficit hyperactivity disorder, combined type: Secondary | ICD-10-CM

## 2022-10-31 MED ORDER — LISDEXAMFETAMINE DIMESYLATE 70 MG PO CAPS
70.0000 mg | ORAL_CAPSULE | Freq: Every day | ORAL | 0 refills | Status: DC
Start: 2022-11-09 — End: 2022-12-18

## 2022-10-31 NOTE — Telephone Encounter (Signed)
Patient called requesting to have her prescription of lisdexamfetamine (VYVANSE) 70 MG capsule [[id number]] sent to another pharmacy.  Current pharmacy is out of this medication. Patient is out of this medication.  New Pharmacy - [facility] on [address]

## 2022-11-19 ENCOUNTER — Telehealth: Payer: Self-pay | Admitting: Family Medicine

## 2022-11-19 DIAGNOSIS — F902 Attention-deficit hyperactivity disorder, combined type: Secondary | ICD-10-CM

## 2022-11-19 MED ORDER — LISDEXAMFETAMINE DIMESYLATE 70 MG PO CAPS: 70.0000 mg | ORAL_CAPSULE | Freq: Every day | ORAL | 0 refills | Status: DC

## 2022-11-19 NOTE — Telephone Encounter (Signed)
Meds ordered this encounter  Medications   lisdexamfetamine (VYVANSE) 70 MG capsule    Sig: Take 1 capsule (70 mg total) by mouth daily before breakfast.    Dispense:  30 capsule    Refill:  0

## 2022-11-19 NOTE — Telephone Encounter (Signed)
Pt called office states she needs Rx for lisdexamfetamine (VYVANSE) 70 MG capsule. Pt was only able to fill last Rx for 20 pills on 10/31/2022. Pt is requesting refills to be sent to   [facility] [address] [place] - [address]  [address] [place], [place] [address]

## 2022-12-13 ENCOUNTER — Ambulatory Visit: Payer: BC Managed Care – PPO

## 2022-12-13 ENCOUNTER — Ambulatory Visit: Payer: BC Managed Care – PPO | Admitting: Family Medicine

## 2022-12-13 ENCOUNTER — Encounter: Payer: Self-pay | Admitting: Family Medicine

## 2022-12-13 VITALS — BP 122/67 | HR 80 | Ht 64.0 in | Wt 214.0 lb

## 2022-12-13 DIAGNOSIS — D171 Benign lipomatous neoplasm of skin and subcutaneous tissue of trunk: Secondary | ICD-10-CM | POA: Diagnosis not present

## 2022-12-13 DIAGNOSIS — M25512 Pain in left shoulder: Secondary | ICD-10-CM | POA: Diagnosis not present

## 2022-12-13 DIAGNOSIS — M545 Low back pain, unspecified: Secondary | ICD-10-CM

## 2022-12-13 NOTE — Progress Notes (Signed)
Established Patient Office Visit  Subjective   Patient ID: Yolanda Mosley, female    DOB: Jul 04, 1980  Age: 42 y.o. MRN: [id number]  Chief Complaint  Patient presents with   Cyst    HPI  Here in today for a lump just above her right hip.  It has been there for at least 5 years.  She thinks maybe it is gradually gotten a little bigger over that timeframe but no recent increase in size.  She says it used to be more firm and now it is a little bit less firm.  She just wanted to make sure that it was not some type of tumor or cancer.  Complains of right ear pain it has been intermittent over the last week.  She actually had some old antibiotics leftover so she took a couple tabs and the ear got better for a day or 2 but then started to hurt again.  She says it started after she initially noticed some pain in the right side of her neck the day prior.  No fevers or chills.  She did switch which ear she wears her earbud in and that seems to be a little bit better as well.  She also is still having some left shoulder pain.  She says that it started very early in the year she actually saw one of our sports med providers who recommended formal PT she did go for about 4 sessions and did well overall she started to notice pretty significant improvement.  But the pain has unfortunately started to come back it mostly affects that anterior shoulder.  Though occasionally it will radiate towards her neck or towards the top of the shoulder but it always seems to start in the anterior shoulder she says it bothers her maybe 2 out of 10 days.  It is worse if she does in any kind of lifting.  She has mostly been's been using Tylenol.  He is also been having some right sided back pain she knows that she has some degenerative disc issues back there.      ROS    Objective:     BP 122/67   Pulse 80   Ht 5\' 4"  (1.626 m)   Wt 214 lb (97.1 kg)   SpO2 100%   BMI 36.73 kg/m    Physical Exam Vitals reviewed.   Constitutional:      Appearance: She is well-developed.  HENT:     Head: Normocephalic and atraumatic.  Eyes:     Conjunctiva/sclera: Conjunctivae normal.  Cardiovascular:     Rate and Rhythm: Normal rate.  Pulmonary:     Effort: Pulmonary effort is normal.  Musculoskeletal:     Comments: Tender over the head of the glenoid anteriorly.  Left shoulder with normal range of motion.  Neg Empty can test.  Non-ender over the acromion and AC joint.  Skin:    General: Skin is dry.     Coloration: Skin is not pale.     Comments: Her left outer hip there is an approximately 3.5 x 5 cm oval-shaped smooth slightly firm lesion.  No sign of enlarged pore.  Neurological:     Mental Status: She is alert and oriented to person, place, and time.  Psychiatric:        Behavior: Behavior normal.     No results found for any visits on 12/13/22.    The 10-year ASCVD risk score (Arnett DK, et al., 2019) is: 0.8%  Assessment & Plan:   Problem List Items Addressed This Visit   None Visit Diagnoses     Lipoma of torso    -  Primary   Relevant Orders   Ambulatory referral to General Surgery   Acute pain of left shoulder       Relevant Orders   DG Shoulder Left   Acute right-sided low back pain without sciatica          I suspect the lesion is most consistent with a lipoma.  Try to give her reassurance that I do not think it is a cancerous growth.  Will refer her to general surgery for consultation to have it removed if it is gradually getting larger I think at this point it probably does need to be surgically removed.  Acute anterior left shoulder pain x 6 months-she did do well with PT but pain has gradually returned she did start doing her home exercises again.  Will get x-ray for further evaluation.  May need additional workup afterwards.  Low back pain without radiculopathy-with known degenerative disc disease.  This is mostly treated with therapy as it will often times be episodic.   Can use anti-inflammatories and stretches.   No follow-ups on file.    Nani Gasser, MD

## 2022-12-13 NOTE — Progress Notes (Signed)
Knot on L hip. She reports that it has changed in shape the area is not hard or painful. Denies any injury or trauma. She said that she had mentioned this to Dr. Linford Arnold about 5 years ago.

## 2022-12-14 ENCOUNTER — Ambulatory Visit: Payer: BC Managed Care – PPO | Admitting: Family Medicine

## 2022-12-14 ENCOUNTER — Other Ambulatory Visit: Payer: Self-pay | Admitting: Family Medicine

## 2022-12-17 NOTE — Progress Notes (Signed)
Hi Yolanda Mosley, overall no sign of significant arthritis in the shoulder which is reassuring.  Could consider MRI for further evaluation at this point or get you back in with Ortho or sports med for neck steps.

## 2022-12-18 ENCOUNTER — Encounter: Payer: Self-pay | Admitting: Family Medicine

## 2022-12-18 ENCOUNTER — Other Ambulatory Visit: Payer: Self-pay | Admitting: Family Medicine

## 2022-12-18 DIAGNOSIS — F902 Attention-deficit hyperactivity disorder, combined type: Secondary | ICD-10-CM

## 2022-12-18 MED ORDER — LISDEXAMFETAMINE DIMESYLATE 70 MG PO CAPS
70.0000 mg | ORAL_CAPSULE | Freq: Every day | ORAL | 0 refills | Status: DC
Start: 2023-02-15 — End: 2023-02-13

## 2022-12-18 MED ORDER — LISDEXAMFETAMINE DIMESYLATE 70 MG PO CAPS
70.0000 mg | ORAL_CAPSULE | Freq: Every day | ORAL | 0 refills | Status: DC
Start: 2023-01-17 — End: 2023-02-13

## 2022-12-18 MED ORDER — LISDEXAMFETAMINE DIMESYLATE 70 MG PO CAPS
70.0000 mg | ORAL_CAPSULE | Freq: Every day | ORAL | 0 refills | Status: DC
Start: 2022-12-18 — End: 2023-02-13

## 2022-12-18 NOTE — Telephone Encounter (Signed)
Meds ordered this encounter  Medications   lisdexamfetamine (VYVANSE) 70 MG capsule    Sig: Take 1 capsule (70 mg total) by mouth daily before breakfast.    Dispense:  30 capsule    Refill:  0   lisdexamfetamine (VYVANSE) 70 MG capsule    Sig: Take 1 capsule (70 mg total) by mouth daily.    Dispense:  30 capsule    Refill:  0   lisdexamfetamine (VYVANSE) 70 MG capsule    Sig: Take 1 capsule (70 mg total) by mouth daily.    Dispense:  30 capsule    Refill:  0

## 2022-12-26 DIAGNOSIS — R2242 Localized swelling, mass and lump, left lower limb: Secondary | ICD-10-CM | POA: Diagnosis not present

## 2022-12-26 DIAGNOSIS — Z1331 Encounter for screening for depression: Secondary | ICD-10-CM | POA: Diagnosis not present

## 2023-01-12 DIAGNOSIS — W52XXXA Crushed, pushed or stepped on by crowd or human stampede, initial encounter: Secondary | ICD-10-CM | POA: Diagnosis not present

## 2023-01-12 DIAGNOSIS — I10 Essential (primary) hypertension: Secondary | ICD-10-CM | POA: Diagnosis not present

## 2023-01-12 DIAGNOSIS — Z79899 Other long term (current) drug therapy: Secondary | ICD-10-CM | POA: Diagnosis not present

## 2023-01-12 DIAGNOSIS — M542 Cervicalgia: Secondary | ICD-10-CM | POA: Diagnosis not present

## 2023-01-12 DIAGNOSIS — M25519 Pain in unspecified shoulder: Secondary | ICD-10-CM | POA: Diagnosis not present

## 2023-01-12 DIAGNOSIS — S161XXA Strain of muscle, fascia and tendon at neck level, initial encounter: Secondary | ICD-10-CM | POA: Diagnosis not present

## 2023-01-12 DIAGNOSIS — S199XXA Unspecified injury of neck, initial encounter: Secondary | ICD-10-CM | POA: Diagnosis not present

## 2023-01-12 DIAGNOSIS — S0990XA Unspecified injury of head, initial encounter: Secondary | ICD-10-CM | POA: Diagnosis not present

## 2023-01-12 DIAGNOSIS — Z888 Allergy status to other drugs, medicaments and biological substances status: Secondary | ICD-10-CM | POA: Diagnosis not present

## 2023-01-12 DIAGNOSIS — Z881 Allergy status to other antibiotic agents status: Secondary | ICD-10-CM | POA: Diagnosis not present

## 2023-01-12 DIAGNOSIS — M546 Pain in thoracic spine: Secondary | ICD-10-CM | POA: Diagnosis not present

## 2023-01-14 ENCOUNTER — Telehealth: Payer: Self-pay | Admitting: General Practice

## 2023-01-14 NOTE — Transitions of Care (Post Inpatient/ED Visit) (Signed)
01/14/2023  Name: Yolanda Mosley MRN: [id number] DOB: Oct 11, 1980  Today's TOC FU Call Status: Today's TOC FU Call Status:: Successful TOC FU Call Completed TOC FU Call Complete Date: 01/14/23 Patient's Name and Date of Birth confirmed.  Transition Care Management Follow-up Telephone Call Date of Discharge: 01/12/23 Discharge Facility: Other [facility]) Name of Other (Non[facility]) Discharge Facility: [facility] Type of Discharge: Emergency Department Reason for ED Visit: Orthopedic Conditions Orthopedic/Injury Diagnosis: Sprain or Strain How have you been since you were released from the hospital?: Better Any questions or concerns?: No  Items Reviewed: Did you receive and understand the discharge instructions provided?: Yes Medications obtained,verified, and reconciled?: Yes (Medications Reviewed) Any new allergies since your discharge?: No Dietary orders reviewed?: NA Do you have support at home?: Yes  Medications Reviewed Today: Medications Reviewed Today     Reviewed by Modesto Charon, RN (Registered Nurse) on 01/14/23 at 1118  Med List Status: <None>   Medication Order Taking? Sig Documenting Provider Last Dose Status Informant  albuterol (VENTOLIN HFA) 108 (90 Base) MCG/ACT inhaler [id number] No Inhale 2 puffs into the lungs every 4 (four) hours as needed for wheezing or shortness of breath. Suzan Slick, MD Taking Active   atenolol (TENORMIN) 50 MG tablet [id number] No Take 1/2 (one-half) tablet by mouth once daily Agapito Games, MD Taking Active   benazepril-hydrochlorthiazide (LOTENSIN HCT) 20-12.5 MG tablet [id number] No Take 2 tablets by mouth daily. Monica Becton, MD Taking Active   cetirizine (ZYRTEC) 10 MG tablet [id number] No Take 10 mg by mouth daily. [provider] Taking Active Self  diltiazem (CARDIZEM CD) 120 MG 24 hr capsule [id number]  Take 1 capsule by mouth once daily Agapito Games, MD  Active   lisdexamfetamine (VYVANSE) 70  MG capsule [id number]  Take 1 capsule (70 mg total) by mouth daily before breakfast. Agapito Games, MD  Active   lisdexamfetamine (VYVANSE) 70 MG capsule [id number]  Take 1 capsule (70 mg total) by mouth daily. Agapito Games, MD  Active   lisdexamfetamine (VYVANSE) 70 MG capsule [id number]  Take 1 capsule (70 mg total) by mouth daily. Agapito Games, MD  Active   methylPREDNISolone acetate (DEPO-MEDROL) injection 40 mg [id number]   Agapito Games, MD  Active             Home Care and Equipment/Supplies: Were Home Health Services Ordered?: NA Any new equipment or medical supplies ordered?: NA  Functional Questionnaire: Do you need assistance with bathing/showering or dressing?: No Do you need assistance with meal preparation?: No Do you need assistance with eating?: No Do you have difficulty maintaining continence: No Do you need assistance with getting out of bed/getting out of a chair/moving?: No Do you have difficulty managing or taking your medications?: No  Follow up appointments reviewed: PCP Follow-up appointment confirmed?: NA Specialist Hospital Follow-up appointment confirmed?: NA Do you need transportation to your follow-up appointment?: No Do you understand care options if your condition(s) worsen?: Yes-patient verbalized understanding  SDOH Interventions Today    Flowsheet Row Most Recent Value  SDOH Interventions   Transportation Interventions Intervention Not Indicated       SIGNATURE Modesto Charon, RN BSN Nurse Health Advisor

## 2023-02-06 DIAGNOSIS — D509 Iron deficiency anemia, unspecified: Secondary | ICD-10-CM | POA: Diagnosis not present

## 2023-02-06 DIAGNOSIS — C561 Malignant neoplasm of right ovary: Secondary | ICD-10-CM | POA: Diagnosis not present

## 2023-02-09 ENCOUNTER — Other Ambulatory Visit: Payer: Self-pay | Admitting: Sports Medicine

## 2023-02-10 NOTE — Telephone Encounter (Signed)
To PCP

## 2023-02-13 ENCOUNTER — Ambulatory Visit: Payer: BC Managed Care – PPO | Admitting: Family Medicine

## 2023-02-13 ENCOUNTER — Encounter: Payer: Self-pay | Admitting: Family Medicine

## 2023-02-13 VITALS — BP 115/82 | HR 83

## 2023-02-13 DIAGNOSIS — I10 Essential (primary) hypertension: Secondary | ICD-10-CM | POA: Diagnosis not present

## 2023-02-13 DIAGNOSIS — G4709 Other insomnia: Secondary | ICD-10-CM | POA: Diagnosis not present

## 2023-02-13 DIAGNOSIS — F902 Attention-deficit hyperactivity disorder, combined type: Secondary | ICD-10-CM | POA: Diagnosis not present

## 2023-02-13 MED ORDER — LISDEXAMFETAMINE DIMESYLATE 70 MG PO CAPS
70.0000 mg | ORAL_CAPSULE | Freq: Every day | ORAL | 0 refills | Status: DC
Start: 2023-02-15 — End: 2023-06-14

## 2023-02-13 MED ORDER — LISDEXAMFETAMINE DIMESYLATE 70 MG PO CAPS
70.0000 mg | ORAL_CAPSULE | Freq: Every day | ORAL | 0 refills | Status: DC
Start: 2023-04-13 — End: 2023-06-18

## 2023-02-13 MED ORDER — LISDEXAMFETAMINE DIMESYLATE 70 MG PO CAPS
70.0000 mg | ORAL_CAPSULE | Freq: Every day | ORAL | 0 refills | Status: DC
Start: 2023-03-15 — End: 2023-06-18

## 2023-02-13 NOTE — Assessment & Plan Note (Addendum)
Did discuss options for prescription medication such as trazodone.  She says for now she will just continue to work on sleep hygiene which we did review today.  If she still has not noticed much improvement she can reach out or we can discuss again at her follow-up in 6 months.  Also discussed holding her Vyvanse maybe on the weekend to see if sleep is better.  Even though the Vyvanse most often wears off by nighttime it can cause sleep disruption.  Half-life is 10 to 12 hours.

## 2023-02-13 NOTE — Progress Notes (Signed)
Established Patient Office Visit  Subjective   Patient ID: Yolanda Mosley, female    DOB: 02/09/1981  Age: 42 y.o. MRN: [id number]  No chief complaint on file.   HPI Hypertension- Pt denies chest pain, SOB, dizziness, or heart palpitations.  Taking meds as directed w/o problems.  Denies medication side effects.    ADD - Reports symptoms are well controlled on current regime. Denies any problems with insomnia, chest pain, palpitations, or SOB.    Still struggling with sleep.  She tried Unisom but didn't help.  Nadryl tends to stimulate her.  She has tried melatonin as well.  She says it is not every night but a lot of night she will wake up frequently and have a hard time going back to sleep.     ROS    Objective:     BP 115/82   Pulse 83   SpO2 99%    Physical Exam Vitals and nursing note reviewed.  Constitutional:      Appearance: Normal appearance.  HENT:     Head: Normocephalic and atraumatic.  Eyes:     Conjunctiva/sclera: Conjunctivae normal.  Cardiovascular:     Rate and Rhythm: Normal rate and regular rhythm.  Pulmonary:     Effort: Pulmonary effort is normal.     Breath sounds: Normal breath sounds.  Skin:    General: Skin is warm and dry.  Neurological:     Mental Status: She is alert.  Psychiatric:        Mood and Affect: Mood normal.      No results found for any visits on 02/13/23.    The 10-year ASCVD risk score (Arnett DK, et al., 2019) is: 0.7%    Assessment & Plan:   Problem List Items Addressed This Visit       Cardiovascular and Mediastinum   Essential hypertension - Primary    Pete blood pressure looks fantastic.  Continue current regimen refills sent.  Follow-up in 6 months.        Other   Insomnia    Did discuss options for prescription medication such as trazodone.  She says for now she will just continue to work on sleep hygiene which we did review today.  If she still has not noticed much improvement she can reach out or we  can discuss again at her follow-up in 6 months.  Also discussed holding her Vyvanse maybe on the weekend to see if sleep is better.  Even though the Vyvanse most often wears off by nighttime it can cause sleep disruption.  Half-life is 10 to 12 hours.      ADHD    Well controlled. Continue current regimen. Follow up in  12mo       Relevant Medications   lisdexamfetamine (VYVANSE) 70 MG capsule (Start on 02/15/2023)   lisdexamfetamine (VYVANSE) 70 MG capsule (Start on 03/15/2023)   lisdexamfetamine (VYVANSE) 70 MG capsule (Start on 04/13/2023)    Return in about 6 months (around 08/14/2023) for Hypertension.    Nani Gasser, MD

## 2023-02-13 NOTE — Assessment & Plan Note (Signed)
Well controlled. Continue current regimen. Follow up in  6 mo  

## 2023-02-13 NOTE — Assessment & Plan Note (Signed)
Yolanda Mosley blood pressure looks fantastic.  Continue current regimen refills sent.  Follow-up in 6 months.

## 2023-02-20 ENCOUNTER — Ambulatory Visit (HOSPITAL_COMMUNITY): Payer: BC Managed Care – PPO | Attending: Vascular Surgery

## 2023-02-20 ENCOUNTER — Ambulatory Visit: Payer: BC Managed Care – PPO | Admitting: Vascular Surgery

## 2023-02-27 ENCOUNTER — Telehealth: Payer: Self-pay | Admitting: Family Medicine

## 2023-02-27 DIAGNOSIS — J069 Acute upper respiratory infection, unspecified: Secondary | ICD-10-CM

## 2023-02-27 MED ORDER — ALBUTEROL SULFATE HFA 108 (90 BASE) MCG/ACT IN AERS: 2.0000 | INHALATION_SPRAY | RESPIRATORY_TRACT | 99 refills | Status: DC | PRN

## 2023-02-27 NOTE — Telephone Encounter (Signed)
Prescription Request  02/27/2023  LOV: 02/13/2023  What is the name of the medication or equipment? albuterol (VENTOLIN HFA) 108 (90 Base) MCG/ACT inhaler   Have you contacted your pharmacy to request a refill? Yes   Which pharmacy would you like this sent to?   [facility] [address], [place] - [address] [place] [address] Phone: [phone] Fax: [phone]   Patient notified that their request is being sent to the clinical staff for review and that they should receive a response within 2 business days.   Please advise at [facility] [phone]

## 2023-02-27 NOTE — Telephone Encounter (Signed)
Medication sent.

## 2023-03-09 ENCOUNTER — Other Ambulatory Visit: Payer: Self-pay | Admitting: Family Medicine

## 2023-03-14 ENCOUNTER — Other Ambulatory Visit: Payer: Self-pay | Admitting: Family Medicine

## 2023-03-31 ENCOUNTER — Other Ambulatory Visit: Payer: Self-pay | Admitting: Nurse Practitioner

## 2023-03-31 DIAGNOSIS — J4530 Mild persistent asthma, uncomplicated: Secondary | ICD-10-CM

## 2023-03-31 DIAGNOSIS — U071 COVID-19: Secondary | ICD-10-CM

## 2023-05-13 ENCOUNTER — Other Ambulatory Visit: Payer: Self-pay | Admitting: Family Medicine

## 2023-05-27 ENCOUNTER — Ambulatory Visit: Payer: BC Managed Care – PPO | Admitting: Family Medicine

## 2023-06-14 ENCOUNTER — Other Ambulatory Visit: Payer: Self-pay | Admitting: Family Medicine

## 2023-06-14 DIAGNOSIS — F902 Attention-deficit hyperactivity disorder, combined type: Secondary | ICD-10-CM

## 2023-06-14 MED ORDER — LISDEXAMFETAMINE DIMESYLATE 70 MG PO CAPS: 70.0000 mg | ORAL_CAPSULE | Freq: Every day | ORAL | 0 refills | Status: DC

## 2023-06-14 NOTE — Telephone Encounter (Signed)
Copied from CRM [id number]. Topic: Clinical - Medication Refill >> Jun 14, 2023  1:30 PM Dennison Nancy wrote: Most Recent Primary Care Visit:  Provider: Nani Gasser D  Department: [facility]  Visit Type: OFFICE VISIT  Date: 02/13/2023  Medication: lisdexamfetamine (VYVANSE) 70 MG capsule  Has the patient contacted their pharmacy? Yes (Agent: If no, request that the patient contact the pharmacy for the refill. If patient does not wish to contact the pharmacy document the reason why and proceed with request.) (Agent: If yes, when and what did the pharmacy advise?)  Is this the correct pharmacy for this prescription? Yes If no, delete pharmacy and type the correct one.  This is the patient's preferred pharmacy:   [facility] [address], [place] - [address] [place] [address] Phone: [phone] Fax: [phone]   Has the prescription been filled recently? Yes  Is the patient out of the medication? Yes , have a 2 pills left   Has the patient been seen for an appointment in the last year OR does the patient have an upcoming appointment? Yes  Can we respond through MyChart? No  Agent: Please be advised that Rx refills may take up to 3 business days. We ask that you follow-up with your pharmacy.

## 2023-06-18 ENCOUNTER — Encounter: Payer: Self-pay | Admitting: Family Medicine

## 2023-06-18 ENCOUNTER — Ambulatory Visit (INDEPENDENT_AMBULATORY_CARE_PROVIDER_SITE_OTHER): Payer: BC Managed Care – PPO | Admitting: Family Medicine

## 2023-06-18 VITALS — BP 117/68 | HR 70 | Ht 64.0 in | Wt 219.0 lb

## 2023-06-18 DIAGNOSIS — I10 Essential (primary) hypertension: Secondary | ICD-10-CM

## 2023-06-18 DIAGNOSIS — F902 Attention-deficit hyperactivity disorder, combined type: Secondary | ICD-10-CM

## 2023-06-18 MED ORDER — LISDEXAMFETAMINE DIMESYLATE 70 MG PO CAPS: 70.0000 mg | ORAL_CAPSULE | Freq: Every day | ORAL | 0 refills | Status: DC

## 2023-06-18 MED ORDER — BENAZEPRIL-HYDROCHLOROTHIAZIDE 20-12.5 MG PO TABS: 2.0000 | ORAL_TABLET | Freq: Every day | ORAL | 1 refills | Status: DC

## 2023-06-18 NOTE — Progress Notes (Signed)
   Established Patient Office Visit  Subjective  Patient ID: Yolanda Mosley, female    DOB: Apr 30, 1981  Age: 43 y.o. MRN: [id number]  Chief Complaint  Patient presents with   Medical Management of Chronic Issues    HPI  ADD - Reports symptoms are well controlled on current regime. Denies any problems with insomnia, chest pain, palpitations, or SOB.       ROS    Objective:     BP 117/68   Pulse 70   Ht 5\' 4"  (1.626 m)   Wt 219 lb (99.3 kg)   SpO2 99%   BMI 37.59 kg/m    Physical Exam Vitals and nursing note reviewed.  Constitutional:      Appearance: Normal appearance.  HENT:     Head: Normocephalic and atraumatic.  Eyes:     Conjunctiva/sclera: Conjunctivae normal.  Cardiovascular:     Rate and Rhythm: Normal rate and regular rhythm.  Pulmonary:     Effort: Pulmonary effort is normal.     Breath sounds: Normal breath sounds.  Skin:    General: Skin is warm and dry.  Neurological:     Mental Status: She is alert.  Psychiatric:        Mood and Affect: Mood normal.      No results found for any visits on 06/18/23.    The 10-year ASCVD risk score (Arnett DK, et al., 2019) is: 0.7%    Assessment & Plan:   Problem List Items Addressed This Visit       Cardiovascular and Mediastinum   Essential hypertension - Primary   BP at goal today.  Will get updated CMP at next OV>        Relevant Medications   benazepril-hydrochlorthiazide (LOTENSIN HCT) 20-12.5 MG tablet     Other   ADHD   Well controlled. Continue current regimen. Follow up in  3 months.       Relevant Medications   lisdexamfetamine (VYVANSE) 70 MG capsule   lisdexamfetamine (VYVANSE) 70 MG capsule   lisdexamfetamine (VYVANSE) 70 MG capsule    Return in about 3 months (around 09/15/2023) for ADD.    Nani Gasser, MD

## 2023-06-18 NOTE — Assessment & Plan Note (Signed)
BP at goal today.  Will get updated CMP at next OV>

## 2023-06-18 NOTE — Assessment & Plan Note (Signed)
Well controlled. Continue current regimen. Follow up in  3 months.

## 2023-08-14 ENCOUNTER — Ambulatory Visit: Payer: BC Managed Care – PPO | Admitting: Family Medicine

## 2023-09-07 ENCOUNTER — Other Ambulatory Visit: Payer: Self-pay | Admitting: Family Medicine

## 2023-09-10 ENCOUNTER — Other Ambulatory Visit: Payer: Self-pay | Admitting: Family Medicine

## 2023-09-16 ENCOUNTER — Ambulatory Visit: Payer: BC Managed Care – PPO | Admitting: Family Medicine

## 2023-09-16 ENCOUNTER — Telehealth: Payer: Self-pay | Admitting: Family Medicine

## 2023-09-16 NOTE — Telephone Encounter (Signed)
 OK for virtual visit .

## 2023-09-16 NOTE — Progress Notes (Deleted)
   Established Patient Office Visit  Subjective  Patient ID: Yolanda Mosley, female    DOB: [birth year]  Age: 43 y.o. MRN: [id number]  No chief complaint on file.   HPI  ADD - Reports symptoms are well controlled on current regime. Denies any problems with insomnia, chest pain, palpitations, or SOB.    Hypertension- Pt denies chest pain, SOB, dizziness, or heart palpitations.  Taking meds as directed w/o problems.  Denies medication side effects.     {History (Optional):23778}  ROS    Objective:     There were no vitals taken for this visit. {Vitals History (Optional):23777}  Physical Exam Vitals and nursing note reviewed.  Constitutional:      Appearance: Normal appearance.  HENT:     Head: Normocephalic and atraumatic.  Eyes:     Conjunctiva/sclera: Conjunctivae normal.  Cardiovascular:     Rate and Rhythm: Normal rate and regular rhythm.  Pulmonary:     Effort: Pulmonary effort is normal.     Breath sounds: Normal breath sounds.  Skin:    General: Skin is warm and dry.  Neurological:     Mental Status: She is alert.  Psychiatric:        Mood and Affect: Mood normal.    No results found for any visits on 09/16/23.  {Labs (Optional):23779}  The 10-year ASCVD risk score (Arnett DK, et al., 2019) is: 0.5%    Assessment & Plan:   Problem List Items Addressed This Visit       Other   ADHD - Primary    No follow-ups on file.    Duaine German, MD

## 2023-09-16 NOTE — Telephone Encounter (Signed)
 Copied from CRM [id number]. Topic: Appointments - Appointment Scheduling >> Sep 16, 2023  9:40 AM Bearl Botts A wrote: Patient missed her appointment this morning due to her car not working. Next available appointment with PCP is 5/22, patient states that she cannot make that due to her nephew graduating. Patient is wanting to see if she can just have a phone visit with her doctor since the appointment is in regards to getting her medication: lisdexamfetamine (VYVANSE ) 70 MG capsule  Please Advise

## 2023-10-17 ENCOUNTER — Encounter: Payer: Self-pay | Admitting: Family Medicine

## 2023-10-17 ENCOUNTER — Telehealth: Payer: Self-pay

## 2023-10-17 ENCOUNTER — Telehealth (INDEPENDENT_AMBULATORY_CARE_PROVIDER_SITE_OTHER): Admitting: Family Medicine

## 2023-10-17 ENCOUNTER — Other Ambulatory Visit: Payer: Self-pay | Admitting: Family Medicine

## 2023-10-17 DIAGNOSIS — F902 Attention-deficit hyperactivity disorder, combined type: Secondary | ICD-10-CM

## 2023-10-17 MED ORDER — LISDEXAMFETAMINE DIMESYLATE 70 MG PO CAPS: 70.0000 mg | ORAL_CAPSULE | Freq: Every day | ORAL | 0 refills | Status: DC

## 2023-10-17 MED ORDER — LISDEXAMFETAMINE DIMESYLATE 70 MG PO CAPS
70.0000 mg | ORAL_CAPSULE | Freq: Every day | ORAL | 0 refills | Status: DC
Start: 2023-10-17 — End: 2024-01-15

## 2023-10-17 NOTE — Telephone Encounter (Signed)
 Medication sent.

## 2023-10-17 NOTE — Telephone Encounter (Signed)
 Copied from CRM [id number]. Topic: Clinical - Medication Refill >> Oct 17, 2023  8:04 AM Shamecia H wrote: Medication: lisdexamfetamine (VYVANSE ) 70 MG capsule  Has the patient contacted their pharmacy? Yes (Agent: If no, request that the patient contact the pharmacy for the refill. If patient does not wish to contact the pharmacy document the reason why and proceed with request.) (Agent: If yes, when and what did the pharmacy advise?)  This is the patient's preferred pharmacy:  [facility] [address] [place] - [address] [place] [address] Phone: [phone] Fax: [phone]  Is this the correct pharmacy for this prescription? Yes If no, delete pharmacy and type the correct one.   Has the prescription been filled recently? Yes  Is the patient out of the medication? Yes  Has the patient been seen for an appointment in the last year OR does the patient have an upcoming appointment? Yes  Can we respond through MyChart? Yes  Agent: Please be advised that Rx refills may take up to 3 business days. We ask that you follow-up with your pharmacy.

## 2023-10-17 NOTE — Progress Notes (Signed)
    Virtual Visit via Video Note  I connected with Yolanda Mosley on 10/17/23 at  3:00 PM EDT by a video enabled telemedicine application and verified that I am speaking with the correct person using two identifiers.   I discussed the limitations of evaluation and management by telemedicine and the availability of in person appointments. The patient expressed understanding and agreed to proceed.  Patient location: at home Provider location: in office  Subjective:    CC:  No chief complaint on file.   HPI: ADD - Reports symptoms are well controlled on current regime. Denies any problems with insomnia, chest pain, palpitations, or SOB.    She was seen in the ED for abdominal pain on 4/25. She says they didn't find a cause. She did have some labs done.    Past medical history, Surgical history, Family history not pertinant except as noted below, Social history, Allergies, and medications have been entered into the medical record, reviewed, and corrections made.    Objective:    General: Speaking clearly in complete sentences without any shortness of breath.  Alert and oriented x3.  Normal judgment. No apparent acute distress.    Impression and Recommendations:    Problem List Items Addressed This Visit       Other   ADHD - Primary   Relevant Medications   lisdexamfetamine (VYVANSE ) 70 MG capsule   lisdexamfetamine (VYVANSE ) 70 MG capsule (Start on 11/14/2023)   lisdexamfetamine (VYVANSE ) 70 MG capsule (Start on 12/12/2023)    No orders of the defined types were placed in this encounter.   Meds ordered this encounter  Medications   lisdexamfetamine (VYVANSE ) 70 MG capsule    Sig: Take 1 capsule (70 mg total) by mouth daily.    Dispense:  30 capsule    Refill:  0   lisdexamfetamine (VYVANSE ) 70 MG capsule    Sig: Take 1 capsule (70 mg total) by mouth daily.    Dispense:  30 capsule    Refill:  0   lisdexamfetamine (VYVANSE ) 70 MG capsule    Sig: Take 1 capsule (70 mg  total) by mouth daily before breakfast.    Dispense:  30 capsule    Refill:  0     I discussed the assessment and treatment plan with the patient. The patient was provided an opportunity to ask questions and all were answered. The patient agreed with the plan and demonstrated an understanding of the instructions.   The patient was advised to call back or seek an in-person evaluation if the symptoms worsen or if the condition fails to improve as anticipated.   Duaine German, MD

## 2023-10-17 NOTE — Telephone Encounter (Signed)
 Spoke with patient. She has video visit today at 3pm with Dr. Greer Leak  Was requesting the vyvanse  be refilled before her appt today.  I informed her that it is necessary that she have the video visit before the medication could be filled for her  She is agreeable and will keep appt today at 3pm

## 2023-10-17 NOTE — Telephone Encounter (Signed)
 Copied from CRM [id number]. Topic: Clinical - Prescription Issue >> Oct 17, 2023  8:05 AM Shelby Dessert H wrote: Reason for CRM: Patient was calling and wanted to know if she could move her appointment up to get her medicine, she is wanting to speak with a nurse about it, I put a rx request in already for the patient, patients callback number is [phone].

## 2023-11-28 ENCOUNTER — Other Ambulatory Visit: Payer: Self-pay | Admitting: Family Medicine

## 2023-12-03 ENCOUNTER — Other Ambulatory Visit: Payer: Self-pay | Admitting: Family Medicine

## 2023-12-03 DIAGNOSIS — Z1231 Encounter for screening mammogram for malignant neoplasm of breast: Secondary | ICD-10-CM

## 2023-12-08 ENCOUNTER — Other Ambulatory Visit: Payer: Self-pay | Admitting: Family Medicine

## 2023-12-09 ENCOUNTER — Other Ambulatory Visit: Payer: Self-pay | Admitting: Family Medicine

## 2023-12-11 ENCOUNTER — Encounter

## 2023-12-11 DIAGNOSIS — Z1231 Encounter for screening mammogram for malignant neoplasm of breast: Secondary | ICD-10-CM

## 2023-12-18 ENCOUNTER — Ambulatory Visit (INDEPENDENT_AMBULATORY_CARE_PROVIDER_SITE_OTHER)

## 2023-12-18 DIAGNOSIS — Z1231 Encounter for screening mammogram for malignant neoplasm of breast: Secondary | ICD-10-CM

## 2023-12-20 ENCOUNTER — Ambulatory Visit: Payer: Self-pay | Admitting: Family Medicine

## 2023-12-20 NOTE — Progress Notes (Signed)
 Please call patient. Normal mammogram.  Repeat in 1 year.

## 2023-12-31 ENCOUNTER — Encounter: Payer: Self-pay | Admitting: Sports Medicine

## 2024-01-08 ENCOUNTER — Ambulatory Visit: Payer: Self-pay

## 2024-01-08 NOTE — Telephone Encounter (Signed)
 FYI Only or Action Required?: Action required by provider: update on patient condition.  Patient was last seen in primary care on 10/17/2023 by Alvan Dorothyann BIRCH, MD.  Called Nurse Triage reporting Sore Throat.  Symptoms began several days ago.  Interventions attempted: Prescription medications: pcn.  Symptoms are: gradually improving.  Triage Disposition: See Physician Within 24 Hours  Patient/caregiver understands and will follow disposition?: No  Copied from CRM [id number]. Topic: Clinical - Red Word Triage >> Jan 08, 2024 10:31 AM Mercer PEDLAR wrote: Red Word that prompted transfer to Nurse Triage: headache, chest pain, sore throat, coughing. Reason for Disposition  [1] Taking antibiotic > 24 hours for strep throat AND [2] sore throat pain is SEVERE  Answer Assessment - Initial Assessment Questions Additional info: Next hospital follow up with PCP is not until 01/13/24, patient declined to schedule follow up, states she will give abx one more day and call back if no improvement.    1. SYMPTOM: What's the main symptom you're concerned about? (e.g., fever, difficulty swallowing, sore throat)     Throat pain 2. ANTIBIOTIC: What antibiotic are you taking? How many times a day?     Yes-started 01/06/24 PCN X 10 days 3. ONSET: When was the antibiotic started?     Several weeks 4. THROAT PAIN:  How bad is the sore throat? (Scale 1-10; mild, moderate or severe)      Strong-hurts to swallow but able to swallow 5. FEVER: Do you have a fever? If Yes, ask: What is your temperature, how was it measured, and when did it start?     denies 6. OTHER SYMPTOMS: Do you have any other symptoms? (e.g., rash)     Pleuritic chest pain, headache, cough 7. BETTER-SAME-WORSE: Are you getting better, staying the same, or getting worse compared to the day you started the antibiotics?     Runny nose resolved, remaining symptoms unchanged 8. PREGNANCY: Is there any chance you are  pregnant? When was your last menstrual period?  Protocols used: Strep Throat Infection on Antibiotic Follow-up Call-A-AH

## 2024-01-09 ENCOUNTER — Telehealth: Payer: Self-pay

## 2024-01-09 NOTE — Telephone Encounter (Signed)
 Copied from CRM [id number]. Topic: Clinical - Medication Question >> Jan 09, 2024 11:03 AM Diannia H wrote: Reason for CRM: Patient is calling because she is wanting the provider to send in her another antibiotic because the one she has is not working. Could you assist?   [facility] [address], [place] - [address] [facility] [address] [facility] [address] [place] [address] Phone: [phone] Fax: [phone] Hours: Not open 24 hours

## 2024-01-09 NOTE — Telephone Encounter (Signed)
 In chart shows that was seen in [facility] ER Monday 01/06/2024 Dx with strep  Patient will need to be seen in office before an abx can be given -   Patient scheduled with Darice brownie- 01/10/2024 at 10:10am

## 2024-01-10 ENCOUNTER — Encounter: Payer: Self-pay | Admitting: Family Medicine

## 2024-01-10 ENCOUNTER — Ambulatory Visit (INDEPENDENT_AMBULATORY_CARE_PROVIDER_SITE_OTHER): Admitting: Family Medicine

## 2024-01-10 VITALS — BP 112/78 | HR 87 | Temp 97.7°F | Ht 64.0 in | Wt 215.0 lb

## 2024-01-10 DIAGNOSIS — J069 Acute upper respiratory infection, unspecified: Secondary | ICD-10-CM | POA: Diagnosis not present

## 2024-01-10 DIAGNOSIS — J020 Streptococcal pharyngitis: Secondary | ICD-10-CM | POA: Insufficient documentation

## 2024-01-10 MED ORDER — PROMETHAZINE-DM 6.25-15 MG/5ML PO SYRP: 5.0000 mL | ORAL_SOLUTION | Freq: Every evening | ORAL | 0 refills | Status: DC | PRN

## 2024-01-10 MED ORDER — FLUTICASONE PROPIONATE 50 MCG/ACT NA SUSP: 2.0000 | Freq: Every day | NASAL | 1 refills | Status: AC

## 2024-01-10 MED ORDER — LIDOCAINE VISCOUS HCL 2 % MT SOLN: 15.0000 mL | Freq: Four times a day (QID) | OROMUCOSAL | 0 refills | Status: AC | PRN

## 2024-01-10 NOTE — Assessment & Plan Note (Signed)
 Likely viral illness at same time. Negative for Covid and RSV at ED. Supportive therapy. Lungs are clear. Sinus rinses. Flonase  nasal spray as instructed. Promethazine -DM 6.5-15 mg at bedtime as needed. Continue to elevate to sleep.

## 2024-01-10 NOTE — Assessment & Plan Note (Signed)
 Complete penicillin V 500 mg twice per day. Supportive therapy for sore throat. Throat with erythema, able to swallow secretions, no drooling. 1+ tonsil enlargement. Warm salt water gargles. Lidocaine  2% solution every 6 hours as needed for sore throat, do not swallow. Follow-up with PCP or this provider if sore throat does not resolve once treatment is complete.

## 2024-01-10 NOTE — Progress Notes (Signed)
 Acute Office Visit  Subjective:     Patient ID: Yolanda Mosley, female    DOB: [birth year], 43 y.o.   MRN: [id number]  Chief Complaint  Patient presents with   Nasal Drainage    Was diagnosed with Strep Monday night, 01/06/2024 and was given an Penicillin BID.  Did not really help. Still have sore throat although it got better. From 10=8 but no other symptoms went away. When I cough up stuff it is dark green. Covid and Flu test were negative. I have asthma so when I cough, I can hardly breathe    HPI Patient is in today for strep throat follow-up. Day # 4 of treatment with penicillin V 500 mg BID for 10 days as prescribed on 9/8 by the ED.  Continues to have nasal congestion and coughing up green sputum. Cough is waking her up.  No wheezing, history of asthma. No shortness of breath in office today. Reports she was told she should be better in 36-72 hours. Covid, flu and RSV negative at ED.       ROS      Objective:    BP 112/78   Pulse 87   Temp 97.7 F (36.5 C) (Oral)   Ht 5' 4 (1.626 m)   Wt 215 lb (97.5 kg)   LMP 12/13/2023 (Exact Date)   SpO2 96%   BMI 36.90 kg/m    Physical Exam Vitals and nursing note reviewed.  Constitutional:      General: She is not in acute distress.    Appearance: Normal appearance. She is not ill-appearing.  HENT:     Right Ear: Tympanic membrane normal.     Left Ear: Tympanic membrane normal.     Nose:     Right Sinus: No maxillary sinus tenderness or frontal sinus tenderness.     Left Sinus: No maxillary sinus tenderness or frontal sinus tenderness.     Mouth/Throat:     Pharynx: Uvula midline. Posterior oropharyngeal erythema and postnasal drip present. No pharyngeal swelling, oropharyngeal exudate or uvula swelling.     Tonsils: 1+ on the right. 1+ on the left.  Cardiovascular:     Rate and Rhythm: Regular rhythm.     Heart sounds: Normal heart sounds.  Pulmonary:     Effort: Pulmonary effort is normal.     Breath sounds:  Normal breath sounds.  Skin:    General: Skin is warm and dry.  Neurological:     General: No focal deficit present.     Mental Status: She is alert. Mental status is at baseline.  Psychiatric:        Mood and Affect: Mood normal.        Behavior: Behavior normal.        Thought Content: Thought content normal.        Judgment: Judgment normal.     No results found for any visits on 01/10/24.      Assessment & Plan:   Problem List Items Addressed This Visit     Strep pharyngitis   Complete penicillin V 500 mg twice per day. Supportive therapy for sore throat. Throat with erythema, able to swallow secretions, no drooling. 1+ tonsil enlargement. Warm salt water gargles. Lidocaine  2% solution every 6 hours as needed for sore throat, do not swallow. Follow-up with PCP or this provider if sore throat does not resolve once treatment is complete.        Relevant Medications   lidocaine  (XYLOCAINE ) 2 % solution  Viral URI with cough - Primary   Likely viral illness at same time. Negative for Covid and RSV at ED. Supportive therapy. Lungs are clear. Sinus rinses. Flonase  nasal spray as instructed. Promethazine -DM 6.5-15 mg at bedtime as needed. Continue to elevate to sleep.       Relevant Medications   fluticasone  (FLONASE ) 50 MCG/ACT nasal spray   promethazine -dextromethorphan (PROMETHAZINE -DM) 6.25-15 MG/5ML syrup  Agrees with plan of care discussed.  Questions answered.   Meds ordered this encounter  Medications   fluticasone  (FLONASE ) 50 MCG/ACT nasal spray    Sig: Place 2 sprays into both nostrils daily.    Dispense:  16 g    Refill:  1    Supervising Provider:   METHENEY, CATHERINE D [[id number]]   lidocaine  (XYLOCAINE ) 2 % solution    Sig: Use as directed 15 mLs in the mouth or throat every 6 (six) hours as needed for mouth pain. Do not swallow.    Dispense:  100 mL    Refill:  0    Supervising Provider:   METHENEY, CATHERINE D [[id number]]    promethazine -dextromethorphan (PROMETHAZINE -DM) 6.25-15 MG/5ML syrup    Sig: Take 5 mLs by mouth at bedtime as needed for cough.    Dispense:  118 mL    Refill:  0    Supervising Provider:   METHENEY, CATHERINE D [[id number]]  Agrees with plan of care discussed.  Questions answered.   Return if symptoms worsen or fail to improve.  Darice JONELLE Brownie, FNP

## 2024-01-13 ENCOUNTER — Ambulatory Visit: Payer: Self-pay

## 2024-01-13 NOTE — Telephone Encounter (Signed)
 Call dropped during transfer to NT. Called patient back, left VM to return the call to the office to speak to a nurse.   Copied from CRM [id number]. Topic: Clinical - Red Word Triage >> Jan 13, 2024 10:22 AM Marda MATSU wrote: Red Word that prompted transfer to Nurse Triage: sob, chest pain, eye discharge, sore throat, congestion

## 2024-01-13 NOTE — Telephone Encounter (Signed)
 Patient scheduled 01/14/2024 with Dr. Alvia

## 2024-01-13 NOTE — Telephone Encounter (Signed)
 No same day visits available, scheduled for next day OV.   FYI Only or Action Required?: FYI only for provider.  Patient was last seen in primary care on 01/10/2024 by Booker Darice SAUNDERS, FNP.  Called Nurse Triage reporting URI.  Symptoms began 10 days ago.  Interventions attempted: Prescription medications: See UC and OV notes.  Symptoms are: gradually worsening.  Triage Disposition: See Physician Within 24 Hours  Patient/caregiver understands and will follow disposition?: Yes  Reason for Disposition  [1] Known COPD or other severe lung disease (i.e., bronchiectasis, cystic fibrosis, lung surgery) AND [2] symptoms getting worse (i.e., increased sputum purulence or amount, increased breathing difficulty  Answer Assessment - Initial Assessment Questions 10 day hx of URI symptoms, tx for strep at ED, seen at PCP office, no improvement, now having bilateral conjunctivitis.  1. ONSET: When did the cough begin?      10 days  2. SEVERITY: How bad is the cough today?      Severe  3. SPUTUM: Describe the color of your sputum (e.g., none, dry cough; clear, white, yellow, green)     Green  5. DIFFICULTY BREATHING: Are you having difficulty breathing? If Yes, ask: How bad is it? (e.g., mild, moderate, severe)      Denies, states chest feels congested  6. FEVER: Do you have a fever? If Yes, ask: What is your temperature, how was it measured, and when did it start?     Denies  7. CARDIAC HISTORY: Do you have any history of heart disease? (e.g., heart attack, congestive heart failure)      Denies  8. LUNG HISTORY: Do you have any history of lung disease?  (e.g., pulmonary embolus, asthma, emphysema)     Asthma, non smoker  Protocols used: Cough - Acute Productive-A-AH

## 2024-01-14 ENCOUNTER — Ambulatory Visit: Admitting: Family Medicine

## 2024-01-14 ENCOUNTER — Encounter: Payer: Self-pay | Admitting: Family Medicine

## 2024-01-14 VITALS — BP 110/68 | HR 88 | Temp 98.0°F | Ht 64.0 in | Wt 217.0 lb

## 2024-01-14 DIAGNOSIS — H9203 Otalgia, bilateral: Secondary | ICD-10-CM | POA: Diagnosis not present

## 2024-01-14 DIAGNOSIS — J069 Acute upper respiratory infection, unspecified: Secondary | ICD-10-CM | POA: Diagnosis not present

## 2024-01-14 DIAGNOSIS — J029 Acute pharyngitis, unspecified: Secondary | ICD-10-CM

## 2024-01-14 DIAGNOSIS — H10503 Unspecified blepharoconjunctivitis, bilateral: Secondary | ICD-10-CM | POA: Diagnosis not present

## 2024-01-14 DIAGNOSIS — J0100 Acute maxillary sinusitis, unspecified: Secondary | ICD-10-CM

## 2024-01-14 LAB — POC COVID19 BINAXNOW: SARS Coronavirus 2 Ag: NEGATIVE

## 2024-01-14 MED ORDER — BENZONATATE 200 MG PO CAPS: 200.0000 mg | ORAL_CAPSULE | Freq: Two times a day (BID) | ORAL | 0 refills | Status: DC | PRN

## 2024-01-14 MED ORDER — AMOXICILLIN-POT CLAVULANATE 875-125 MG PO TABS: 1.0000 | ORAL_TABLET | Freq: Two times a day (BID) | ORAL | 0 refills | Status: DC

## 2024-01-14 MED ORDER — ALBUTEROL SULFATE HFA 108 (90 BASE) MCG/ACT IN AERS: 2.0000 | INHALATION_SPRAY | RESPIRATORY_TRACT | 1 refills | Status: AC | PRN

## 2024-01-14 NOTE — Progress Notes (Signed)
 Acute Office Visit  Subjective:     Patient ID: Yolanda Mosley, female    DOB: Feb 01, 1981, 43 y.o.   MRN: [id number]  Chief Complaint  Patient presents with   URI    HPI Patient is in today for   Discussed the use of AI scribe software for clinical note transcription with the patient, who gave verbal consent to proceed.  History of Present Illness Yolanda Mosley is a 43 year old female who presents with persistent sore throat and respiratory symptoms x 2 weeks. Yolanda Mosley  Pharyngitis and upper respiratory symptoms - Sore throat began eleven days ago and has persisted despite treatment - Dysphagia with sensation of choking - Dysphonia since onset of illness - No improvement with penicillin taken twice daily for strep throat - Sinus pain developed by end of first week - Rhinorrhea began on second day of illness  Otalgia and ocular symptoms - Otalgia developed by end of first week - Ocular redness and pruritus developed over the weekend - No ocular matting  Cough and sputum production - Cough with brown and yellow sputum - Cough worsens with heat exposure  Chest pain and systemic symptoms - Chest pain developed by end of first week - Hot flashes, chills, and sweats since onset of illness - No fever, but feels very hot with sweats and chills - No gastrointestinal upset  Infectious disease testing - Tested negative for COVID-19, influenza, and RSV on third day of illness - Tested positive for strep throat on third day of illness    ROS      Objective:    BP 110/68   Pulse 88   Temp 98 F (36.7 C) (Oral)   Ht 5' 4 (1.626 m)   Wt 217 lb (98.4 kg)   LMP 12/13/2023 (Exact Date)   SpO2 97%   BMI 37.25 kg/m    Physical Exam Constitutional:      Appearance: Normal appearance.  HENT:     Head: Normocephalic and atraumatic.     Right Ear: Tympanic membrane, ear canal and external ear normal. There is no impacted cerumen.     Left Ear: Tympanic membrane, ear canal and  external ear normal. There is no impacted cerumen.     Nose: Nose normal.     Mouth/Throat:     Pharynx: Oropharynx is clear. Posterior oropharyngeal erythema present. No oropharyngeal exudate.  Eyes:     Conjunctiva/sclera: Conjunctivae normal.  Neck:     Comments: Small swollen LN on left side of neck, tender Cardiovascular:     Rate and Rhythm: Normal rate and regular rhythm.  Pulmonary:     Effort: Pulmonary effort is normal.     Breath sounds: Normal breath sounds.  Musculoskeletal:     Cervical back: Neck supple. No tenderness.  Lymphadenopathy:     Cervical: No cervical adenopathy.  Skin:    General: Skin is warm and dry.  Neurological:     Mental Status: She is alert and oriented to person, place, and time.  Psychiatric:        Mood and Affect: Mood normal.     Results for orders placed or performed in visit on 01/14/24  POC COVID-19  Result Value Ref Range   SARS Coronavirus 2 Ag Negative Negative        Assessment & Plan:   Problem List Items Addressed This Visit       Respiratory   Viral URI with cough   Relevant Medications   albuterol  (  VENTOLIN  HFA) 108 (90 Base) MCG/ACT inhaler   Other Relevant Orders   POC COVID-19 (Completed)   Other Visit Diagnoses       Sorethroat    -  Primary   Relevant Medications   amoxicillin -clavulanate (AUGMENTIN ) 875-125 MG tablet     Acute non-recurrent maxillary sinusitis       Relevant Medications   amoxicillin -clavulanate (AUGMENTIN ) 875-125 MG tablet   benzonatate  (TESSALON ) 200 MG capsule     Otalgia of both ears       Relevant Medications   amoxicillin -clavulanate (AUGMENTIN ) 875-125 MG tablet     Blepharoconjunctivitis of both eyes, unspecified blepharoconjunctivitis type           Meds ordered this encounter  Medications   amoxicillin -clavulanate (AUGMENTIN ) 875-125 MG tablet    Sig: Take 1 tablet by mouth 2 (two) times daily.    Dispense:  14 tablet    Refill:  0   albuterol  (VENTOLIN  HFA) 108  (90 Base) MCG/ACT inhaler    Sig: Inhale 2 puffs into the lungs every 4 (four) hours as needed for wheezing or shortness of breath.    Dispense:  8.5 g    Refill:  1   benzonatate  (TESSALON ) 200 MG capsule    Sig: Take 1 capsule (200 mg total) by mouth 2 (two) times daily as needed for cough.    Dispense:  20 capsule    Refill:  0   Assessment and Plan Assessment & Plan Acute upper respiratory infection with pharyngitis, sinusitis, and conjunctivitis Symptoms suggest started with viral infection superimposed on bacterial infection. Conjunctivitis-like symptoms raise suspicion of COVID-19. . - Reswab for COVID-19 due to eye symptoms and persistent illness. - will tx sinusitis at this point with Augmentin .  - call if not better by end of the week.   - salt water gargles.     Return if symptoms worsen or fail to improve.  Dorothyann Byars, MD

## 2024-01-14 NOTE — Progress Notes (Signed)
 Pt stated that she was seen on 9/8 at the ED for strep. She was Tx with Penicillin v 500 mg x10 days.   She states that she has been taking the ABX however, her sxs have not improved. She then went to [place] and was seen by Darice Brownie for Viral URI w/cough. She was started on Lidocaine  2% Q6H PRN, flonase  (didn't take), and Promethazine -DM which she states really hasn't helped with the cough.  She now reports watery/swollen eyes, cough, runny nose,sore throat, and it feels like something is stuck in her throat when she tries to swallow.

## 2024-01-15 ENCOUNTER — Telehealth: Payer: Self-pay | Admitting: Family Medicine

## 2024-01-15 ENCOUNTER — Other Ambulatory Visit: Payer: Self-pay | Admitting: Family Medicine

## 2024-01-15 DIAGNOSIS — F902 Attention-deficit hyperactivity disorder, combined type: Secondary | ICD-10-CM

## 2024-01-15 MED ORDER — LISDEXAMFETAMINE DIMESYLATE 70 MG PO CAPS: 70.0000 mg | ORAL_CAPSULE | Freq: Every day | ORAL | 0 refills | Status: DC

## 2024-01-15 NOTE — Telephone Encounter (Unsigned)
 Copied from CRM [id number]. Topic: Clinical - Medication Refill >> Jan 15, 2024  9:55 AM Diannia H wrote: Medication: lisdexamfetamine (VYVANSE ) 70 MG capsule  Has the patient contacted their pharmacy? Yes (Agent: If no, request that the patient contact the pharmacy for the refill. If patient does not wish to contact the pharmacy document the reason why and proceed with request.) (Agent: If yes, when and what did the pharmacy advise?)  This is the patient's preferred pharmacy:  [facility] [address] [place] - [address] [place] [address] Phone: [phone] Fax: [phone]  Is this the correct pharmacy for this prescription? Yes If no, delete pharmacy and type the correct one.   Has the prescription been filled recently? Yes  Is the patient out of the medication? Yes  Has the patient been seen for an appointment in the last year OR does the patient have an upcoming appointment? Yes  Can we respond through MyChart? Yes  Agent: Please be advised that Rx refills may take up to 3 business days. We ask that you follow-up with your pharmacy.

## 2024-01-15 NOTE — Progress Notes (Unsigned)
   Established Patient Office Visit  Subjective  Patient ID: Yolanda Mosley, female    DOB: 1980/05/11  Age: 43 y.o. MRN: [id number]  No chief complaint on file.   HPI  {History (Optional):23778}  ROS    Objective:     LMP 12/13/2023 (Exact Date)  {Vitals History (Optional):23777}  Physical Exam   No results found for any visits on 01/16/24.  {Labs (Optional):23779}  The 10-year ASCVD risk score (Arnett DK, et al., 2019) is: 0.7%    Assessment & Plan:   Problem List Items Addressed This Visit   None   No follow-ups on file.    Dorothyann Byars, MD

## 2024-01-15 NOTE — Telephone Encounter (Signed)
 Patient is requesting refills on vyvance 70mg  she is out  [facility] [address] in [place]

## 2024-01-15 NOTE — Telephone Encounter (Signed)
 Refill sent.

## 2024-01-15 NOTE — Telephone Encounter (Signed)
 Meds ordered this encounter  Medications   lisdexamfetamine (VYVANSE ) 70 MG capsule    Sig: Take 1 capsule (70 mg total) by mouth daily.    Dispense:  30 capsule    Refill:  0   lisdexamfetamine (VYVANSE ) 70 MG capsule    Sig: Take 1 capsule (70 mg total) by mouth daily.    Dispense:  30 capsule    Refill:  0   lisdexamfetamine (VYVANSE ) 70 MG capsule    Sig: Take 1 capsule (70 mg total) by mouth daily before breakfast.    Dispense:  30 capsule    Refill:  0

## 2024-01-16 ENCOUNTER — Encounter: Payer: Self-pay | Admitting: Family Medicine

## 2024-01-16 ENCOUNTER — Telehealth (INDEPENDENT_AMBULATORY_CARE_PROVIDER_SITE_OTHER): Admitting: Family Medicine

## 2024-01-16 DIAGNOSIS — J0100 Acute maxillary sinusitis, unspecified: Secondary | ICD-10-CM | POA: Diagnosis not present

## 2024-01-16 DIAGNOSIS — F902 Attention-deficit hyperactivity disorder, combined type: Secondary | ICD-10-CM

## 2024-01-16 NOTE — Assessment & Plan Note (Signed)
 Well controlled. Continue current regimen. Follow up in  6 mo

## 2024-01-16 NOTE — Progress Notes (Signed)
    Virtual Visit via Video Note  I connected with Yolanda Mosley on 01/16/24 at  4:00 PM EDT by a video enabled telemedicine application and verified that I am speaking with the correct person using two identifiers.   I discussed the limitations of evaluation and management by telemedicine and the availability of in person appointments. The patient expressed understanding and agreed to proceed.  Patient location: at home Provider location: in office  Subjective:    CC:   Chief Complaint  Patient presents with   Follow-up    ADHD    HPI: Here for follow-up ADHD virtually.  She is doing well on her current medication regimen she is not having any side effects or problems.  She is happy with her Vyvanse .  I saw her a couple of days ago for sinusitis after she had already been sick for almost 2 weeks.  She had been treated for strep throat initially but continued to have sinus congestion and cough.  She feels like she is about 25% better but it still kind of hanging on she says the mucus is now more clear but she is still coughing a lot.  He is just worried that she is not getting better as quickly as she usually does   Past medical history, Surgical history, Family history not pertinant except as noted below, Social history, Allergies, and medications have been entered into the medical record, reviewed, and corrections made.    Objective:    General: Speaking clearly in complete sentences without any shortness of breath.  Alert and oriented x3.  Normal judgment. No apparent acute distress.    Impression and Recommendations:    Problem List Items Addressed This Visit       Other   ADHD - Primary   Well controlled. Continue current regimen. Follow up in  66mo       Other Visit Diagnoses       Acute non-recurrent maxillary sinusitis           Sinusitis-continue with current antibiotic hopefully she will continue to improve over the weekend recommend that she add in some  salt water gargles and nasal saline rinse tonight and see if that is helpful.   No orders of the defined types were placed in this encounter.   No orders of the defined types were placed in this encounter.    I discussed the assessment and treatment plan with the patient. The patient was provided an opportunity to ask questions and all were answered. The patient agreed with the plan and demonstrated an understanding of the instructions.   The patient was advised to call back or seek an in-person evaluation if the symptoms worsen or if the condition fails to improve as anticipated.   Dorothyann Byars, MD

## 2024-01-22 ENCOUNTER — Other Ambulatory Visit: Payer: Self-pay | Admitting: Family Medicine

## 2024-02-04 ENCOUNTER — Other Ambulatory Visit: Payer: Self-pay | Admitting: Family Medicine

## 2024-02-04 DIAGNOSIS — I10 Essential (primary) hypertension: Secondary | ICD-10-CM

## 2024-03-02 ENCOUNTER — Other Ambulatory Visit: Payer: Self-pay | Admitting: Family Medicine

## 2024-03-05 ENCOUNTER — Other Ambulatory Visit: Payer: Self-pay | Admitting: Family Medicine

## 2024-03-12 ENCOUNTER — Ambulatory Visit: Payer: Self-pay

## 2024-03-12 NOTE — Telephone Encounter (Signed)
 FYI Only or Action Required?: FYI only for provider: Urgent Care.  Patient was last seen in primary care on 01/16/2024 by Alvan Dorothyann BIRCH, MD.  Called Nurse Triage reporting Numbness and Tingling.  Symptoms began 3 days ago.  Interventions attempted: Rest, hydration, or home remedies.  Symptoms are: unchanged.  Triage Disposition: See HCP Within 4 Hours (Or PCP Triage)  Patient/caregiver understands and will follow disposition?: Yes  Copied from CRM [id number]. Topic: Clinical - Red Word Triage >> Mar 12, 2024  9:10 AM Darshell M wrote: Red Word that prompted transfer to Nurse Triage: Weakness or weird sensation behind her right knee cap for last three days. This morning patient began experiencing same sensation on the right shoulder, radiating down her arm like a shooting nerve pain. More pronounced when she is sitting down or laying down. Reason for Disposition  [1] Numbness (i.e., loss of sensation) of the face, arm / hand, or leg / foot on one side of the body AND [2] gradual onset (e.g., days to weeks) AND [3] present now  Answer Assessment - Initial Assessment Questions 3 days of numbness and tingling to right knee and shoulder. Patient recommended to urgent care to be seen today.   1. SYMPTOM: What is the main symptom you are concerned about? (e.g., weakness, numbness)     Numbness and tingling in right knee cap and shoulder.  2. ONSET: When did this start? (e.g., minutes, hours, days; while sleeping)     Started three days ago 3. LAST NORMAL: When was the last time you (the patient) were normal (no symptoms)?     Three days ago 4. PATTERN Does this come and go, or has it been constant since it started?  Is it present now?     Patient states she feels it more sitting or laying down. Patient states when she is standing-doesn't feel the symptoms.  5. CARDIAC SYMPTOMS: Have you had any of the following symptoms: chest pain, difficulty breathing, palpitations?      no 6. NEUROLOGIC SYMPTOMS: Have you had any of the following symptoms: headache, dizziness, vision loss, double vision, changes in speech, unsteady on your feet?     no 7. OTHER SYMPTOMS: Do you have any other symptoms?     no 8. PREGNANCY: Is there any chance you are pregnant? When was your last menstrual period?     no  Protocols used: Neurologic Deficit-A-AH

## 2024-03-13 ENCOUNTER — Ambulatory Visit (INDEPENDENT_AMBULATORY_CARE_PROVIDER_SITE_OTHER): Admitting: Urgent Care

## 2024-03-13 ENCOUNTER — Encounter: Payer: Self-pay | Admitting: Urgent Care

## 2024-03-13 VITALS — BP 107/70 | HR 93 | Ht 64.0 in | Wt 215.0 lb

## 2024-03-13 DIAGNOSIS — M7918 Myalgia, other site: Secondary | ICD-10-CM | POA: Diagnosis not present

## 2024-03-13 DIAGNOSIS — M79661 Pain in right lower leg: Secondary | ICD-10-CM

## 2024-03-13 DIAGNOSIS — G8929 Other chronic pain: Secondary | ICD-10-CM

## 2024-03-13 DIAGNOSIS — M25512 Pain in left shoulder: Secondary | ICD-10-CM | POA: Diagnosis not present

## 2024-03-13 MED ORDER — BACLOFEN 10 MG PO TABS
10.0000 mg | ORAL_TABLET | Freq: Three times a day (TID) | ORAL | 5 refills | Status: AC
Start: 2024-03-13 — End: ?

## 2024-03-13 NOTE — Patient Instructions (Addendum)
  You have cervical trigger point, which is knots in the muscles of the neck. Please apply a warm moist compress, such as a microwavable heating pack, to your neck several times daily. After each warm compress, apply the technique that we discussed today of ischemic release. This is a prolonged, deep pressure into the knot of the muscle to release the tension. You can use Epsom salts to help with muscle relaxation in a bath.  Take the muscle relaxer three times daily as needed. Keep in mind it may make you feel tired or drowsy, so do not operate machinery or drive a car until you know how it affects you.  If your symptoms persist, you would be a candidate for trigger point injection or dry needling.  Try to stay hydrated with WATER as dehydration and caffeine intake can worsen this condition.  [facility] [address], [place], [place] [address] Phone: [phone] Ask for Joie

## 2024-03-13 NOTE — Progress Notes (Unsigned)
 Established Patient Office Visit  Subjective:  Patient ID: Yolanda Mosley, female    DOB: June 22, 1980  Age: 43 y.o. MRN: [id number]  Chief Complaint  Patient presents with   Follow-up    ED follow-up - numbness and tingling Pt states experiencing a cold sensation on the inside of her right leg and right side of the chest area. CT, US , AND EKG done at ED everything came back good Patient states she also experienced blurry vision and chest pain in the ED   Shoulder Pain    Pt is experiencing reoccurring left shoulder pain with we have treated with PT but always tends to make its way back    HPI  Discussed the use of AI scribe software for clinical note transcription with the patient, who gave verbal consent to proceed.  History of Present Illness   Yolanda Mosley is a 43 year old female who presents for follow-up after an emergency department visit for calf pain.  She experienced a 'cold numbing feeling' in the back of her right calf, radiating upwards to her head and downwards, persisting for three days. The pain was accompanied by numbness and swelling, worsening when lying down or sitting. She also experienced blurry vision and chest pain during this episode which prompted the ER visit yesterday. An EKG, ultrasound of the leg, blood work, urine analysis, and a CT of the head were performed in the ED, all of which returned normal results. The symptoms resolved by the morning of the visit. She is asymptomatic at this point from all of those symptoms.  She has a history of left shoulder pain for the past two years, which is worsening. The pain is located in the trapezius and rotator cuff muscle sites. She uses Tiger Balm and a massage gun for relief, which helps relatively. She has undergone physical therapy intermittently over the past year. An x-ray performed a year ago showed no abnormalities. She rates her pain in the affected areas as six or seven out of ten. The pain has spread from the initial  site to her shoulder blade and down her arm, impacting her ability to perform activities such as holding her newborn nephew and coaching cheerleading.  She works as a producer, television/film/video and has difficulty performing tasks due to her shoulder pain. No current calf pain, numbness, or swelling.       Patient Active Problem List   Diagnosis Date Noted   Strep pharyngitis 01/10/2024   Viral URI with cough 01/10/2024   Chronic periscapular pain on left side 05/24/2022   Arthralgia of left acromioclavicular joint 04/10/2022   Tibialis posterior tendinitis, right 04/10/2022   Insomnia 08/08/2021   Subclinical hypothyroidism 08/10/2020   Abnormal cardiac CT angiography    LVH (left ventricular hypertrophy) 06/25/2020   Ovarian cyst    Essential hypertension    Herpes simplex virus (HSV) infection    GERD (gastroesophageal reflux disease)    Asthma    Allergy    Mild mitral regurgitation by prior echocardiogram 11/14/2016   Venous stasis dermatitis of both lower extremities 10/23/2016   History of tachycardia 10/22/2016   Mild persistent asthma without complication 08/23/2016   Gastroesophageal reflux disease with esophagitis 08/16/2016   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   DDD (degenerative disc disease), lumbar 08/11/2014   Mid back pain 08/10/2014   Bilateral low back pain without sciatica 08/10/2014   History of ovarian cancer 01/20/2014   Gastric ulcer 09/14/2013   Anemia,  iron deficiency 09/09/2013   Obesity 05/31/2011   ADHD 03/01/2011   OSA on CPAP 01/08/2011   External hemorrhoids 01/09/2010   Allergic rhinitis 01/09/2010   Past Medical History:  Diagnosis Date   ADHD 03/01/2011   ALLERGIC RHINITIS 01/09/2010   Qualifier: Diagnosis of  By: Alvan MD, Dorothyann     Allergy    Anemia    Anemia, iron deficiency 09/09/2013   Anxiety    Arthritis 08/11/2014   Asthma    Bilateral low back pain without sciatica 08/10/2014   CA - cancer 2005    Germ Cell Tumor - Rt Ovary   Clotting disorder    DDD (degenerative disc disease), lumbar 08/11/2014   L5 and S1 via xrays 07/2014.     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  CPAP 7 cm water pressure.  Mild OSA AHI 8.7.    Ovarian cyst    left   Peripheral edema 10/22/2016   Post chemo evaluation 04/04/2015   Sleep apnea    Tachycardia    Venous stasis dermatitis of both lower extremities 10/23/2016   Past Surgical History:  Procedure Laterality Date   LEFT HEART CATH AND CORONARY ANGIOGRAPHY N/A 07/14/2020   Procedure: LEFT HEART CATH AND CORONARY ANGIOGRAPHY;  Surgeon: Burnard Debby LABOR, MD;  Location: [facility];  Service: Cardiovascular;  Laterality: N/A;   OMENTECTOMY  2005   ovary, bilater fallopian tubes removed  2005   SALPINGOOPHORECTOMY Right    Social History   Tobacco Use   Smoking status: Never   Smokeless tobacco: Never  Substance Use Topics   Alcohol use: No   Drug use: No      ROS: as noted in HPI  Objective:     BP 107/70 (BP Location: Left Arm, Patient Position: Sitting, Cuff Size: Large)   Pulse  93   Ht 5' 4 (1.626 m)   Wt 215 lb (97.5 kg)   SpO2 98%   BMI 36.90 kg/m  BP Readings from Last 3 Encounters:  03/13/24 107/70  01/14/24 110/68  01/10/24 112/78   Wt Readings from Last 3 Encounters:  03/13/24 215 lb (97.5 kg)  01/14/24 217 lb (98.4 kg)  01/10/24 215 lb (97.5 kg)      Physical Exam Vitals and nursing note reviewed. Exam conducted with a chaperone present.  Constitutional:      General: She is not in acute distress.    Appearance: Normal appearance. She is not ill-appearing, toxic-appearing or diaphoretic.  HENT:     Head: Normocephalic and atraumatic.     Right Ear:  Tympanic membrane, ear canal and external ear normal. There is no impacted cerumen.     Left Ear: Tympanic membrane, ear canal and external ear normal. There is no impacted cerumen.     Nose: Nose normal.     Mouth/Throat:     Mouth: Mucous membranes are moist.     Pharynx: Oropharynx is clear. No oropharyngeal exudate or posterior oropharyngeal erythema.  Eyes:     General: No scleral icterus.       Right eye: No discharge.        Left eye: No discharge.     Extraocular Movements: Extraocular movements intact.     Pupils: Pupils are equal, round, and reactive to light.  Neck:     Thyroid: No thyroid mass, thyromegaly or thyroid tenderness.  Cardiovascular:     Rate and Rhythm: Normal rate and regular rhythm.     Pulses: Normal pulses.     Heart sounds: No murmur heard. Pulmonary:     Effort: Pulmonary effort is normal. No respiratory distress.     Breath sounds: Normal breath sounds. No stridor. No wheezing or rhonchi.  Abdominal:     General: Abdomen is flat. Bowel sounds are normal. There is no distension.     Palpations: Abdomen is soft. There is no mass.     Tenderness: There is no abdominal tenderness. There is no guarding.  Musculoskeletal:     Right shoulder: Normal. No swelling, deformity, effusion, laceration, tenderness, bony tenderness or crepitus. Normal range of motion.  Normal strength. Normal pulse.     Left shoulder: Tenderness (muscular) present. No swelling, deformity, effusion, laceration, bony tenderness or crepitus. Normal range of motion. Normal strength. Normal pulse.       Arms:     Cervical back: Normal range of motion and neck supple. No rigidity or tenderness.     Right lower leg: No edema.     Left lower leg: No edema.     Comments: No calf swelling, edema, erythema No bakers cyst Normal pulses Negative Homan sign R calf Negative Hawkins/ neer L shoulder Negative empty can and obriens Negative speed/ yergason  Lymphadenopathy:     Cervical: No cervical adenopathy.  Skin:    General: Skin is warm and dry.     Coloration: Skin is not jaundiced.     Findings: No bruising, erythema or rash.  Neurological:     General: No focal deficit present.     Mental Status: She is alert and oriented to person, place, and time.     Sensory: No sensory deficit.     Motor: No weakness.  Psychiatric:        Mood and Affect: Mood normal.        Behavior: Behavior normal.      No results found for any visits on 03/13/24.  Last CBC Lab Results  Component Value Date   WBC 8.5 11/06/2021   HGB 13.8 11/06/2021   HCT 40.6 11/06/2021   MCV 84.4 11/06/2021   MCH 28.7 11/06/2021   RDW 13.8 11/06/2021   PLT 247 11/06/2021   Last metabolic panel Lab Results  Component Value Date   GLUCOSE 124 (H) 11/06/2021   NA 138 11/06/2021   K 3.1 (L) 11/06/2021   CL 99 11/06/2021   CO2 30 11/06/2021   BUN 13 11/06/2021   CREATININE 0.73 11/06/2021   EGFR 107 11/06/2021   CALCIUM 9.6 11/06/2021   PROT 6.8 11/06/2021   ALBUMIN 4.0 11/04/2015   BILITOT  0.4 11/06/2021   ALKPHOS 109 11/04/2015   AST 18 11/06/2021   ALT 18 11/06/2021   Last lipids Lab Results  Component Value Date   CHOL 178 11/06/2021   HDL 47 (L) 11/06/2021   LDLCALC 96 11/06/2021   TRIG 234 (H) 11/06/2021   CHOLHDL 3.8 11/06/2021   Last hemoglobin A1c No results found for:  HGBA1C Last thyroid functions Lab Results  Component Value Date   TSH 5.01 (H) 06/13/2020   FREET4 1.09 04/29/2012   Last vitamin D No results found for: 25OHVITD2, 25OHVITD3, VD25OH Last vitamin B12 and Folate No results found for: VITAMINB12, FOLATE    The 10-year ASCVD risk score (Arnett DK, et al., 2019) is: 0.7%  Assessment & Plan:  Chronic left shoulder pain -     Baclofen; Take 1 tablet (10 mg total) by mouth 3 (three) times daily.  Dispense: 30 each; Refill: 5  Myofascial pain on left side -     Baclofen; Take 1 tablet (10 mg total) by mouth 3 (three) times daily.  Dispense: 30 each; Refill: 5  Right calf pain  Assessment and Plan    Chronic left shoulder and upper back myofascial pain Chronic myofascial pain in the left shoulder and upper back with tenderness in trapezius and rotator cuff muscles. Pain exacerbated by lifting, with radiation down the arm. Previous x-ray negative. Physical therapy provided temporary relief. Differential includes muscle tension and possible nerve involvement due to brachial plexus compression.  - Prescribed baclofen for muscle relaxation, to be taken before bed. If tolerated, TID - Recommended professional massage therapy. - Advised use of moist heat on the shoulder. - Suggested use of a back support tool for muscle tension relief. - Encouraged use of Epsom salts in baths for muscle relaxation. - Discussed lifestyle modifications including reducing caffeine and alcohol intake, and improving hydration and stress management.  - refer back to PT if sx do not improve with the above  R calf pain This has completely resolved and workup from the ER was negative.        No follow-ups on file.   Benton LITTIE Gave, PA

## 2024-04-08 ENCOUNTER — Other Ambulatory Visit: Payer: Self-pay | Admitting: Family Medicine

## 2024-04-08 DIAGNOSIS — F902 Attention-deficit hyperactivity disorder, combined type: Secondary | ICD-10-CM

## 2024-04-08 NOTE — Telephone Encounter (Signed)
 Copied from CRM #[id number]. Topic: Clinical - Medication Refill >> Apr 08, 2024  9:44 AM Avram MATSU wrote: Medication: lisdexamfetamine (VYVANSE ) 70 MG capsule [[id number]  Has the patient contacted their pharmacy? No (Agent: If no, request that the patient contact the pharmacy for the refill. If patient does not wish to contact the pharmacy document the reason why and proceed with request.) (Agent: If yes, when and what did the pharmacy advise?)  This is the patient's preferred pharmacy:  [facility] [address] [place] - [address] [place] [address] Phone: [phone] Fax: [phone]   Is this the correct pharmacy for this prescription? Yes If no, delete pharmacy and type the correct one.   Has the prescription been filled recently? No  Is the patient out of the medication? No  Has the patient been seen for an appointment in the last year OR does the patient have an upcoming appointment? Yes  Can we respond through MyChart? Yes  Agent: Please be advised that Rx refills may take up to 3 business days. We ask that you follow-up with your pharmacy.

## 2024-04-09 MED ORDER — LISDEXAMFETAMINE DIMESYLATE 70 MG PO CAPS: 70.0000 mg | ORAL_CAPSULE | Freq: Every day | ORAL | 0 refills | Status: AC

## 2024-04-09 NOTE — Telephone Encounter (Signed)
 Requesting rx rf of Vyvanse  70mg   Last written 03/11/2024 Last OV 03/13/2024 whitney crain Upcoming appt = none

## 2024-05-04 DIAGNOSIS — I10 Essential (primary) hypertension: Secondary | ICD-10-CM

## 2024-05-08 ENCOUNTER — Other Ambulatory Visit: Payer: Self-pay | Admitting: Family Medicine

## 2024-05-08 DIAGNOSIS — I10 Essential (primary) hypertension: Secondary | ICD-10-CM

## 2024-05-13 ENCOUNTER — Other Ambulatory Visit: Payer: Self-pay | Admitting: Family Medicine

## 2024-05-13 DIAGNOSIS — F902 Attention-deficit hyperactivity disorder, combined type: Secondary | ICD-10-CM

## 2024-05-13 NOTE — Telephone Encounter (Signed)
 Requesting rx rf of Vyvanse  70mg  Last written 03/30/2024 Last OV 03/13/2024 Upcoming appt = none

## 2024-05-13 NOTE — Telephone Encounter (Signed)
 Copied from CRM [id number]. Topic: Clinical - Medication Refill >> May 13, 2024 11:18 AM Victoria B wrote: Medication: lisdexamfetamine  (VYVANSE ) 70 MG capsule  Has the patient contacted their pharmacy? yes (Agent: If yes, when and what did the pharmacy advise?)contact pcp  This is the patient's preferred pharmacy:  [facility] [address], [place] - [address] [place] [address] Phone: [phone] Fax: [phone]   Is this the correct pharmacy for this prescription? yes .   Has the prescription been filled recently? no  Is the patient out of the medication? Has 1 left  Has the patient been seen for an appointment in the last year OR does the patient have an upcoming appointment? yes  Can we respond through MyChart? no  Agent: Please be advised that Rx refills may take up to 3 business days. We ask that you follow-up with your pharmacy.

## 2024-05-14 MED ORDER — LISDEXAMFETAMINE DIMESYLATE 70 MG PO CAPS: 70.0000 mg | ORAL_CAPSULE | Freq: Every day | ORAL | 0 refills | Status: AC

## 2024-05-28 ENCOUNTER — Other Ambulatory Visit: Payer: Self-pay | Admitting: Family Medicine

## 2024-06-05 ENCOUNTER — Other Ambulatory Visit: Payer: Self-pay | Admitting: *Deleted
